# Patient Record
Sex: Male | Born: 1975
Health system: Southern US, Community
[De-identification: ages and names within clinical notes are randomized; demographics above are authoritative.]

## PROBLEM LIST (undated history)

## (undated) DIAGNOSIS — I10 Essential (primary) hypertension: Secondary | ICD-10-CM

## (undated) DIAGNOSIS — I639 Cerebral infarction, unspecified: Secondary | ICD-10-CM

---

## 1997-11-05 ENCOUNTER — Encounter: Payer: Self-pay | Admitting: Emergency Medicine

## 1997-11-05 ENCOUNTER — Emergency Department (HOSPITAL_COMMUNITY): Admission: EM | Admit: 1997-11-05 | Discharge: 1997-11-05 | Payer: Self-pay | Admitting: Emergency Medicine

## 1997-11-12 ENCOUNTER — Emergency Department (HOSPITAL_COMMUNITY): Admission: EM | Admit: 1997-11-12 | Discharge: 1997-11-12 | Payer: Self-pay | Admitting: Emergency Medicine

## 2001-12-20 ENCOUNTER — Emergency Department (HOSPITAL_COMMUNITY): Admission: EM | Admit: 2001-12-20 | Discharge: 2001-12-20 | Payer: Self-pay | Admitting: Emergency Medicine

## 2011-06-22 ENCOUNTER — Emergency Department (HOSPITAL_COMMUNITY)
Admission: EM | Admit: 2011-06-22 | Discharge: 2011-06-22 | Disposition: A | Discharge status: Home / Self Care | Payer: 59 | Attending: Emergency Medicine | Admitting: Emergency Medicine

## 2011-06-22 ENCOUNTER — Encounter (HOSPITAL_COMMUNITY): Payer: Self-pay | Admitting: *Deleted

## 2011-06-22 DIAGNOSIS — L03116 Cellulitis of left lower limb: Secondary | ICD-10-CM

## 2011-06-22 DIAGNOSIS — L02419 Cutaneous abscess of limb, unspecified: Secondary | ICD-10-CM | POA: Insufficient documentation

## 2011-06-22 DIAGNOSIS — F17200 Nicotine dependence, unspecified, uncomplicated: Secondary | ICD-10-CM | POA: Insufficient documentation

## 2011-06-22 MED ORDER — CLINDAMYCIN HCL 300 MG PO CAPS: 300.0000 mg | ORAL_CAPSULE | Freq: Three times a day (TID) | ORAL | Status: AC

## 2011-06-22 NOTE — ED Provider Notes (Signed)
History     CSN: [id number]  Arrival date & time 06/22/11  1918   First MD Initiated Contact with Patient 06/22/11 2105      Chief Complaint  Patient presents with  . Rash    (Consider location/radiation/quality/duration/timing/severity/associated sxs/prior treatment) HPI The patient presents to the ER with a red painful are to his L lower leg for the past 2 days. The patient denies fever, weakness, nausea, vomiting, or numbness. The patient states that the area has not drained any material. The patient noted that it was itching and that is what brought him to the ER. The patient denies any treatment prior to arrival.  History reviewed. No pertinent past medical history.  History reviewed. No pertinent past surgical history.  No family history on file.  History  Substance Use Topics  . Smoking status: Current Everyday Smoker -- 0.2 packs/day  . Smokeless tobacco: Not on file  . Alcohol Use: Yes     social rare      Review of Systems All other systems negative except as documented in the HPI. All pertinent positives and negatives as reviewed in the HPI.  Allergies  Review of patient's allergies indicates no known allergies.  Home Medications   Current Outpatient Rx  Name Route Sig Dispense Refill  . BACITRACIN-NEOMYCIN-POLYMYXIN [id number] EX OINT Topical Apply 1 application topically every 12 (twelve) hours. Applied to rash.      BP 147/104  Pulse 67  Temp(Src) 98.4 F (36.9 C) (Oral)  Resp 18  Wt 220 lb (99.791 kg)  SpO2 100%  Physical Exam  Nursing note and vitals reviewed. Constitutional: He appears well-developed and well-nourished. No distress.  Cardiovascular: Normal rate, regular rhythm and normal heart sounds.   Pulmonary/Chest: Effort normal and breath sounds normal.  Musculoskeletal:       Legs:   ED Course  Procedures (including critical care time)  The patient has been seen by the attending Physician.  The patient will be treated for a  lower leg cellulitis and advised to return here in 2 days for a recheck. Return here sooner for any worsening. Told to use warm compresses on the area.   MDM          Carlyle Dolly, PA-C 06/29/11 [phone]

## 2011-06-22 NOTE — ED Notes (Signed)
Pt c/o rash to left lower leg since Friday; painful; started itching today

## 2011-06-22 NOTE — Discharge Instructions (Signed)
Return here for any worsening in your condition. Use warm compresses on the area.

## 2011-06-30 NOTE — ED Provider Notes (Signed)
Medical screening examination/treatment/procedure(s) were conducted as a shared visit with non-physician practitioner(s) and myself.  I personally evaluated the patient during the encounter  LLE warmth, pain. No induration. Neurovasc intact. Abx, needs recheck 2 days   Forbes Cellar, MD 06/30/11 [phone]

## 2017-07-05 ENCOUNTER — Emergency Department (HOSPITAL_COMMUNITY)
Admission: EM | Admit: 2017-07-05 | Discharge: 2017-07-05 | Disposition: A | Discharge status: Home / Self Care | Payer: Self-pay | Attending: Emergency Medicine | Admitting: Emergency Medicine

## 2017-07-05 ENCOUNTER — Other Ambulatory Visit: Payer: Self-pay

## 2017-07-05 ENCOUNTER — Encounter (HOSPITAL_COMMUNITY): Payer: Self-pay | Admitting: Emergency Medicine

## 2017-07-05 ENCOUNTER — Emergency Department (HOSPITAL_COMMUNITY): Payer: Self-pay

## 2017-07-05 DIAGNOSIS — Y999 Unspecified external cause status: Secondary | ICD-10-CM | POA: Insufficient documentation

## 2017-07-05 DIAGNOSIS — Y9389 Activity, other specified: Secondary | ICD-10-CM | POA: Insufficient documentation

## 2017-07-05 DIAGNOSIS — S93401A Sprain of unspecified ligament of right ankle, initial encounter: Secondary | ICD-10-CM | POA: Insufficient documentation

## 2017-07-05 DIAGNOSIS — M62838 Other muscle spasm: Secondary | ICD-10-CM | POA: Insufficient documentation

## 2017-07-05 DIAGNOSIS — Y92410 Unspecified street and highway as the place of occurrence of the external cause: Secondary | ICD-10-CM | POA: Insufficient documentation

## 2017-07-05 DIAGNOSIS — M25571 Pain in right ankle and joints of right foot: Secondary | ICD-10-CM

## 2017-07-05 DIAGNOSIS — M25561 Pain in right knee: Secondary | ICD-10-CM

## 2017-07-05 DIAGNOSIS — S8391XA Sprain of unspecified site of right knee, initial encounter: Secondary | ICD-10-CM | POA: Insufficient documentation

## 2017-07-05 DIAGNOSIS — F17210 Nicotine dependence, cigarettes, uncomplicated: Secondary | ICD-10-CM | POA: Insufficient documentation

## 2017-07-05 MED ORDER — CYCLOBENZAPRINE HCL 10 MG PO TABS: 10.0000 mg | ORAL_TABLET | Freq: Three times a day (TID) | ORAL | 0 refills | Status: DC | PRN

## 2017-07-05 MED ORDER — NAPROXEN 500 MG PO TABS: 500.0000 mg | ORAL_TABLET | Freq: Two times a day (BID) | ORAL | 0 refills | Status: DC | PRN
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
## 2018-08-14 ENCOUNTER — Emergency Department (HOSPITAL_COMMUNITY): Payer: Medicaid Other

## 2018-08-14 ENCOUNTER — Encounter (HOSPITAL_COMMUNITY): Payer: Self-pay

## 2018-08-14 ENCOUNTER — Inpatient Hospital Stay (HOSPITAL_COMMUNITY)
Admission: EM | Admit: 2018-08-14 | Discharge: 2018-08-17 | DRG: 065 | Disposition: A | Discharge status: Rehabilitation Facility | Payer: Medicaid Other | Attending: Internal Medicine | Admitting: Internal Medicine

## 2018-08-14 ENCOUNTER — Other Ambulatory Visit: Payer: Self-pay

## 2018-08-14 DIAGNOSIS — Z833 Family history of diabetes mellitus: Secondary | ICD-10-CM | POA: Diagnosis not present

## 2018-08-14 DIAGNOSIS — E785 Hyperlipidemia, unspecified: Secondary | ICD-10-CM

## 2018-08-14 DIAGNOSIS — I69354 Hemiplegia and hemiparesis following cerebral infarction affecting left non-dominant side: Secondary | ICD-10-CM

## 2018-08-14 DIAGNOSIS — I639 Cerebral infarction, unspecified: Secondary | ICD-10-CM | POA: Diagnosis not present

## 2018-08-14 DIAGNOSIS — I700 Atherosclerosis of aorta: Secondary | ICD-10-CM | POA: Diagnosis present

## 2018-08-14 DIAGNOSIS — E559 Vitamin D deficiency, unspecified: Secondary | ICD-10-CM | POA: Diagnosis present

## 2018-08-14 DIAGNOSIS — R7989 Other specified abnormal findings of blood chemistry: Secondary | ICD-10-CM | POA: Diagnosis not present

## 2018-08-14 DIAGNOSIS — H5462 Unqualified visual loss, left eye, normal vision right eye: Secondary | ICD-10-CM | POA: Diagnosis present

## 2018-08-14 DIAGNOSIS — Z823 Family history of stroke: Secondary | ICD-10-CM | POA: Diagnosis not present

## 2018-08-14 DIAGNOSIS — G8194 Hemiplegia, unspecified affecting left nondominant side: Secondary | ICD-10-CM | POA: Diagnosis not present

## 2018-08-14 DIAGNOSIS — N179 Acute kidney failure, unspecified: Secondary | ICD-10-CM | POA: Diagnosis present

## 2018-08-14 DIAGNOSIS — F17210 Nicotine dependence, cigarettes, uncomplicated: Secondary | ICD-10-CM | POA: Diagnosis present

## 2018-08-14 DIAGNOSIS — E669 Obesity, unspecified: Secondary | ICD-10-CM | POA: Diagnosis present

## 2018-08-14 DIAGNOSIS — E876 Hypokalemia: Secondary | ICD-10-CM | POA: Diagnosis not present

## 2018-08-14 DIAGNOSIS — Z20828 Contact with and (suspected) exposure to other viral communicable diseases: Secondary | ICD-10-CM | POA: Diagnosis present

## 2018-08-14 DIAGNOSIS — F1290 Cannabis use, unspecified, uncomplicated: Secondary | ICD-10-CM | POA: Diagnosis not present

## 2018-08-14 DIAGNOSIS — R7303 Prediabetes: Secondary | ICD-10-CM | POA: Diagnosis not present

## 2018-08-14 DIAGNOSIS — R29709 NIHSS score 9: Secondary | ICD-10-CM | POA: Diagnosis present

## 2018-08-14 DIAGNOSIS — Z7902 Long term (current) use of antithrombotics/antiplatelets: Secondary | ICD-10-CM

## 2018-08-14 DIAGNOSIS — R29810 Facial weakness: Secondary | ICD-10-CM | POA: Diagnosis present

## 2018-08-14 DIAGNOSIS — R4781 Slurred speech: Secondary | ICD-10-CM | POA: Diagnosis present

## 2018-08-14 DIAGNOSIS — K5901 Slow transit constipation: Secondary | ICD-10-CM | POA: Diagnosis not present

## 2018-08-14 DIAGNOSIS — I253 Aneurysm of heart: Secondary | ICD-10-CM | POA: Diagnosis present

## 2018-08-14 DIAGNOSIS — E1151 Type 2 diabetes mellitus with diabetic peripheral angiopathy without gangrene: Secondary | ICD-10-CM | POA: Diagnosis present

## 2018-08-14 DIAGNOSIS — G8114 Spastic hemiplegia affecting left nondominant side: Secondary | ICD-10-CM | POA: Diagnosis not present

## 2018-08-14 DIAGNOSIS — Z832 Family history of diseases of the blood and blood-forming organs and certain disorders involving the immune mechanism: Secondary | ICD-10-CM | POA: Diagnosis not present

## 2018-08-14 DIAGNOSIS — I10 Essential (primary) hypertension: Secondary | ICD-10-CM | POA: Diagnosis present

## 2018-08-14 DIAGNOSIS — I63431 Cerebral infarction due to embolism of right posterior cerebral artery: Secondary | ICD-10-CM | POA: Diagnosis present

## 2018-08-14 DIAGNOSIS — I69398 Other sequelae of cerebral infarction: Secondary | ICD-10-CM | POA: Diagnosis not present

## 2018-08-14 DIAGNOSIS — H53462 Homonymous bilateral field defects, left side: Secondary | ICD-10-CM | POA: Diagnosis not present

## 2018-08-14 DIAGNOSIS — F482 Pseudobulbar affect: Secondary | ICD-10-CM | POA: Diagnosis not present

## 2018-08-14 DIAGNOSIS — Z6829 Body mass index (BMI) 29.0-29.9, adult: Secondary | ICD-10-CM | POA: Diagnosis not present

## 2018-08-14 DIAGNOSIS — I6330 Cerebral infarction due to thrombosis of unspecified cerebral artery: Secondary | ICD-10-CM

## 2018-08-14 DIAGNOSIS — R531 Weakness: Secondary | ICD-10-CM | POA: Diagnosis present

## 2018-08-14 HISTORY — DX: Essential (primary) hypertension: I10

## 2018-08-14 HISTORY — DX: Cerebral infarction, unspecified: I63.9

## 2018-08-14 LAB — I-STAT CHEM 8, ED
BUN: 21 mg/dL — ABNORMAL HIGH (ref 6–20)
Calcium, Ion: 1.23 mmol/L (ref 1.15–1.40)
Chloride: 107 mmol/L (ref 98–111)
Creatinine, Ser: 1 mg/dL (ref 0.61–1.24)
Glucose, Bld: 149 mg/dL — ABNORMAL HIGH (ref 70–99)
HCT: 45 % (ref 39.0–52.0)
Hemoglobin: 15.3 g/dL (ref 13.0–17.0)
Potassium: 3.7 mmol/L (ref 3.5–5.1)
Sodium: 143 mmol/L (ref 135–145)
TCO2: 25 mmol/L (ref 22–32)

## 2018-08-14 LAB — PROTIME-INR
INR: 1 (ref 0.8–1.2)
Prothrombin Time: 13.5 seconds (ref 11.4–15.2)

## 2018-08-14 LAB — URINALYSIS, ROUTINE W REFLEX MICROSCOPIC
Bacteria, UA: NONE SEEN
Bilirubin Urine: NEGATIVE
Glucose, UA: NEGATIVE mg/dL
Ketones, ur: NEGATIVE mg/dL
Leukocytes,Ua: NEGATIVE
Nitrite: NEGATIVE
Protein, ur: NEGATIVE mg/dL
Specific Gravity, Urine: 1.02 (ref 1.005–1.030)
pH: 6 (ref 5.0–8.0)

## 2018-08-14 LAB — APTT: aPTT: 26 seconds (ref 24–36)

## 2018-08-14 LAB — CBG MONITORING, ED: Glucose-Capillary: 147 mg/dL — ABNORMAL HIGH (ref 70–99)

## 2018-08-14 LAB — ETHANOL: Alcohol, Ethyl (B): <10 mg/dL (ref <10)

## 2018-08-14 LAB — COMPREHENSIVE METABOLIC PANEL
ALT: 30 U/L (ref 0–44)
AST: 18 U/L (ref 15–41)
Albumin: 4 g/dL (ref 3.5–5.0)
Alkaline Phosphatase: 111 U/L (ref 38–126)
Anion gap: 9 (ref 5–15)
BUN: 19 mg/dL (ref 6–20)
CO2: 24 mmol/L (ref 22–32)
Calcium: 9.3 mg/dL (ref 8.9–10.3)
Chloride: 108 mmol/L (ref 98–111)
Creatinine, Ser: 1.07 mg/dL (ref 0.61–1.24)
GFR calc Af Amer: >60 mL/min (ref >60)
GFR calc non Af Amer: >60 mL/min (ref >60)
Glucose, Bld: 155 mg/dL — ABNORMAL HIGH (ref 70–99)
Potassium: 3.6 mmol/L (ref 3.5–5.1)
Sodium: 141 mmol/L (ref 135–145)
Total Bilirubin: 0.7 mg/dL (ref 0.3–1.2)
Total Protein: 7.2 g/dL (ref 6.5–8.1)

## 2018-08-14 LAB — DIFFERENTIAL
Abs Immature Granulocytes: 0.05 10*3/uL (ref 0.00–0.07)
Basophils Absolute: 0 10*3/uL (ref 0.0–0.1)
Basophils Relative: 0 %
Eosinophils Absolute: 0 10*3/uL (ref 0.0–0.5)
Eosinophils Relative: 0 %
Immature Granulocytes: 0 %
Lymphocytes Relative: 11 %
Lymphs Abs: 1.4 10*3/uL (ref 0.7–4.0)
Monocytes Absolute: 1 10*3/uL (ref 0.1–1.0)
Monocytes Relative: 8 %
Neutro Abs: 10.3 10*3/uL — ABNORMAL HIGH (ref 1.7–7.7)
Neutrophils Relative %: 81 %

## 2018-08-14 LAB — CBC
HCT: 43.2 % (ref 39.0–52.0)
Hemoglobin: 15.4 g/dL (ref 13.0–17.0)
MCH: 31.5 pg (ref 26.0–34.0)
MCHC: 35.6 g/dL (ref 30.0–36.0)
MCV: 88.3 fL (ref 80.0–100.0)
Platelets: 185 10*3/uL (ref 150–400)
RBC: 4.89 MIL/uL (ref 4.22–5.81)
RDW: 13.4 % (ref 11.5–15.5)
WBC: 12.7 10*3/uL — ABNORMAL HIGH (ref 4.0–10.5)
nRBC: 0 % (ref 0.0–0.2)

## 2018-08-14 LAB — RAPID URINE DRUG SCREEN, HOSP PERFORMED
Amphetamines: NOT DETECTED
Barbiturates: NOT DETECTED
Benzodiazepines: NOT DETECTED
Cocaine: NOT DETECTED
Opiates: NOT DETECTED
Tetrahydrocannabinol: POSITIVE — AB

## 2018-08-14 LAB — SARS CORONAVIRUS 2 (TAT 6-24 HRS): SARS Coronavirus 2: NEGATIVE

## 2018-08-14 MED ORDER — ACETAMINOPHEN 160 MG/5ML PO SOLN: 650.0000 mg | ORAL | Status: DC | PRN

## 2018-08-14 MED ORDER — ASPIRIN 300 MG RE SUPP
600.0000 mg | Freq: Once | RECTAL | Status: AC
  Administered 2018-08-14: 600 mg via RECTAL
  Filled 2018-08-14: qty 2

## 2018-08-14 MED ORDER — ASPIRIN 300 MG RE SUPP: 300.0000 mg | Freq: Every day | RECTAL | Status: DC

## 2018-08-14 MED ORDER — ACETAMINOPHEN 325 MG PO TABS
650.0000 mg | ORAL_TABLET | ORAL | Status: DC | PRN
  Administered 2018-08-14: 650 mg via ORAL
  Filled 2018-08-14: qty 2

## 2018-08-14 MED ORDER — ACETAMINOPHEN 650 MG RE SUPP: 650.0000 mg | RECTAL | Status: DC | PRN

## 2018-08-14 MED ORDER — STROKE: EARLY STAGES OF RECOVERY BOOK
Freq: Once | Status: AC
  Administered 2018-08-14: 22:00:00
  Filled 2018-08-14: qty 1

## 2018-08-14 MED ORDER — SODIUM CHLORIDE 0.9 % IV SOLN
INTRAVENOUS | Status: DC
  Administered 2018-08-14: 23:00:00 via INTRAVENOUS

## 2018-08-14 MED ORDER — CLOPIDOGREL BISULFATE 75 MG PO TABS
75.0000 mg | ORAL_TABLET | Freq: Every day | ORAL | Status: DC
  Administered 2018-08-15 – 2018-08-17 (×2): 75 mg via ORAL
  Filled 2018-08-14 (×3): qty 1

## 2018-08-14 MED ORDER — ENOXAPARIN SODIUM 40 MG/0.4ML ~~LOC~~ SOLN
40.0000 mg | SUBCUTANEOUS | Status: DC
  Administered 2018-08-14 – 2018-08-16 (×2): 40 mg via SUBCUTANEOUS
  Filled 2018-08-14 (×3): qty 0.4

## 2018-08-14 MED ORDER — SENNOSIDES-DOCUSATE SODIUM 8.6-50 MG PO TABS
1.0000 | ORAL_TABLET | Freq: Every day | ORAL | Status: DC
  Administered 2018-08-16: 1 via ORAL
  Filled 2018-08-14 (×3): qty 1

## 2018-08-14 MED ORDER — ASPIRIN 325 MG PO TABS
325.0000 mg | ORAL_TABLET | Freq: Every day | ORAL | Status: DC
  Administered 2018-08-15 – 2018-08-17 (×2): 325 mg via ORAL
  Filled 2018-08-14 (×4): qty 1

## 2018-08-14 MED ORDER — IOHEXOL 350 MG/ML SOLN
90.0000 mL | Freq: Once | INTRAVENOUS | Status: AC | PRN
  Administered 2018-08-14: 90 mL via INTRAVENOUS
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
## 2018-08-14 NOTE — ED Notes (Signed)
Meal given to pt. Pt able to eat and drink independently. Family remains at bedside

## 2018-08-14 NOTE — ED Notes (Signed)
Please notify pt's mother Pincus Badder with updates: [phone]

## 2018-08-14 NOTE — ED Notes (Signed)
ED Provider at bedside. 

## 2018-08-14 NOTE — ED Provider Notes (Signed)
Care assumed from Dr. Sandi Carne at 7879.  43 year old gentleman with a history of right PCA stroke 6 months ago with residual left-sided weakness and visual field deficits.  Here with chief complaint of onset of dizziness last night around 9 PM.  Continue this morning and he fell and hit his head.  Had new left-sided facial droop and left-sided hemineglect with right eyelid ptosis.  Code stroke on arrival.  CT studies showed encephalomalacia with right PCA distribution.  Neurology following and recommends MRI prior to admission.  Still has deficits.  UA unremarkable for UTI.  UDS positive for THC.  No significant electrolyte abnormalities.  Mild leukocytosis of 12.7.  No anemia.  Normal coags.  Plan at the time of handoff was completion of MRI which the patient is awaiting.  Will then reengage neurology for expected admission.  MRI showed acute infarct.  Neurology reengage and the patient was subsequently admitted to the hospitalist service.   Tillie Fantasia, MD 08/14/18 6226    Sherwood Gambler, MD 08/14/18 [phone]

## 2018-08-14 NOTE — ED Notes (Signed)
ED TO INPATIENT HANDOFF REPORT  ED Nurse Name and Phone #:  [phone]  S Name/Age/Gender Todd Hood 43 y.o. male Room/Bed: 036C/036C  Code Status   Code Status: Not on file  Home/SNF/Other Home Patient oriented to: self, place, time and situation Is this baseline? Yes   Triage Complete: Triage complete  Chief Complaint stroke like sx  Triage Note Pt arrives with [facility] from home c/o dizziness that started yesterday that has increased this morning. Pt attempted to get up this morning at 0900 and states he "felt like he was going to fall over." Pt has hx of stroke in February 2020. 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 Per EMS, LVO is 2. Upon inspection, smile is asymmetrical. LKW 2130 last night. Pt states he recently stopped taking plavix in preparation for oral surgery.  EMS vitals:  99% O2 on RA RR 18 119/70 HR 72 CBG 168 97.1 temp     Allergies No Known Allergies  Level of Care/Admitting Diagnosis ED Disposition    ED Disposition Condition Comment   Admit  [facility] [[id number]]  Level of Care: Telemetry Cardiac [103]  Covid Evaluation: Asymptomatic Screening Protocol (No Symptoms)  Diagnosis: Stroke (cerebrum) [facility](HCC) [[id number]]) [[id number]]  Admitting Physician: Rhetta MuraSAMTANI, JAI-GURMUKH [phone][[id number]]  Attending Physician: Rhetta MuraSAMTANI, JAI-GURMUKH [phone][[id number]]  Estimated length of stay: past midnight tomorrow  Certification:: I certify this patient will need inpatient services for at least 2 midnights  PT Class (Do Not Modify): Inpatient [101]  PT Acc Code (Do Not Modify): Private [1]       B Medical/Surgery History Past Medical History:  Diagnosis Date  . CVA (cerebral vascular accident) (HCC)   . Hypertension    History reviewed. No pertinent surgical history.   A IV Location/Drains/Wounds Patient Lines/Drains/Airways  Status   Active Line/Drains/Airways    Name:   Placement date:   Placement time:   Site:   Days:   Peripheral IV 08/14/18 Right Hand   08/14/18    1049    Hand   less than 1   Peripheral IV 08/14/18 Right Antecubital   08/14/18    1123    Antecubital   less than 1          Intake/Output Last 24 hours No intake or output data in the 24 hours ending 08/14/18 2055  Labs/Imaging Results for orders placed or performed during the hospital encounter of 08/14/18 (from the past 48 hour(s))  Ethanol     Status: None   Collection Time: 08/14/18 11:06 AM  Result Value Ref Range   Alcohol, Ethyl (B) <10 <10 mg/dL    Comment: (NOTE) Lowest detectable limit for serum alcohol is 10 mg/dL. For medical purposes only. Performed at [facility], [address] [place], [place] [address]   Protime-INR     Status: None   Collection Time: 08/14/18 11:06 AM  Result Value Ref Range   Prothrombin Time 13.5 11.4 - 15.2 seconds   INR 1.0 0.8 - 1.2    Comment: (NOTE) INR goal varies based on device and disease states. Performed at [facility], [address] [place], [place] [address]   APTT     Status: None   Collection Time: 08/14/18 11:06 AM  Result Value Ref Range   aPTT 26 24 - 36 seconds    Comment: Performed  at [facility], [address] [place], [place] [address]  CBC     Status: Abnormal   Collection Time: 08/14/18 11:06 AM  Result Value Ref Range   WBC 12.7 (H) 4.0 - 10.5 K/uL   RBC 4.89 4.22 - 5.81 MIL/uL   Hemoglobin 15.4 13.0 - 17.0 g/dL   HCT 19.1 47.8 - 29.5 %   MCV 88.3 80.0 - 100.0 fL   MCH 31.5 26.0 - 34.0 pg   MCHC 35.6 30.0 - 36.0 g/dL   RDW 62.1 30.8 - 65.7 %   Platelets 185 150 - 400 K/uL   nRBC 0.0 0.0 - 0.2 %    Comment: Performed at [facility], [address], [place], [place] [address]  Differential     Status: Abnormal   Collection Time: 08/14/18 11:06 AM  Result Value Ref Range   Neutrophils Relative % 81 %   Neutro Abs 10.3 (H)  1.7 - 7.7 K/uL   Lymphocytes Relative 11 %   Lymphs Abs 1.4 0.7 - 4.0 K/uL   Monocytes Relative 8 %   Monocytes Absolute 1.0 0.1 - 1.0 K/uL   Eosinophils Relative 0 %   Eosinophils Absolute 0.0 0.0 - 0.5 K/uL   Basophils Relative 0 %   Basophils Absolute 0.0 0.0 - 0.1 K/uL   Immature Granulocytes 0 %   Abs Immature Granulocytes 0.05 0.00 - 0.07 K/uL    Comment: Performed at [facility], [address] [place], [place] [address]  Comprehensive metabolic panel     Status: Abnormal   Collection Time: 08/14/18 11:06 AM  Result Value Ref Range   Sodium 141 135 - 145 mmol/L   Potassium 3.6 3.5 - 5.1 mmol/L   Chloride 108 98 - 111 mmol/L   CO2 24 22 - 32 mmol/L   Glucose, Bld 155 (H) 70 - 99 mg/dL   BUN 19 6 - 20 mg/dL   Creatinine, Ser 4.13 0.61 - 1.24 mg/dL   Calcium 9.3 8.9 - 24.4 mg/dL   Total Protein 7.2 6.5 - 8.1 g/dL   Albumin 4.0 3.5 - 5.0 g/dL   AST 18 15 - 41 U/L   ALT 30 0 - 44 U/L   Alkaline Phosphatase 111 38 - 126 U/L   Total Bilirubin 0.7 0.3 - 1.2 mg/dL   GFR calc non Af Amer >60 >60 mL/min   GFR calc Af Amer >60 >60 mL/min   Anion gap 9 5 - 15    Comment: Performed at [facility], [address] [place], [place] [address]  I-stat chem 8, ED     Status: Abnormal   Collection Time: 08/14/18 11:23 AM  Result Value Ref Range   Sodium 143 135 - 145 mmol/L   Potassium 3.7 3.5 - 5.1 mmol/L   Chloride 107 98 - 111 mmol/L   BUN 21 (H) 6 - 20 mg/dL   Creatinine, Ser 2.53 0.61 - 1.24 mg/dL   Glucose, Bld 664 (H) 70 - 99 mg/dL   Calcium, Ion 4.03 4.74 - 1.40 mmol/L   TCO2 25 22 - 32 mmol/L   Hemoglobin 15.3 13.0 - 17.0 g/dL   HCT 25.9 56.3 - 87.5 %  CBG monitoring, ED     Status: Abnormal   Collection Time: 08/14/18 11:31 AM  Result Value Ref Range   Glucose-Capillary 147 (H) 70 - 99 mg/dL   Comment 1 Notify RN    Comment 2 Document in Chart  Urine rapid drug screen (hosp performed)     Status: Abnormal   Collection Time: 08/14/18 11:33 AM  Result  Value Ref Range   Opiates NONE DETECTED NONE DETECTED   Cocaine NONE DETECTED NONE DETECTED   Benzodiazepines NONE DETECTED NONE DETECTED   Amphetamines NONE DETECTED NONE DETECTED   Tetrahydrocannabinol POSITIVE (A) NONE DETECTED   Barbiturates NONE DETECTED NONE DETECTED    Comment: (NOTE) DRUG SCREEN FOR MEDICAL PURPOSES ONLY.  IF CONFIRMATION IS NEEDED FOR ANY PURPOSE, NOTIFY LAB WITHIN 5 DAYS. LOWEST DETECTABLE LIMITS FOR URINE DRUG SCREEN Drug Class                     Cutoff (ng/mL) Amphetamine and metabolites    1000 Barbiturate and metabolites    200 Benzodiazepine                 767 Tricyclics and metabolites     300 Opiates and metabolites        300 Cocaine and metabolites        300 THC                            50 Performed at [facility], [place] [address], [place], [place] [address]   Urinalysis, Routine w reflex microscopic     Status: Abnormal   Collection Time: 08/14/18 11:33 AM  Result Value Ref Range   Color, Urine YELLOW YELLOW   APPearance CLEAR CLEAR   Specific Gravity, Urine 1.020 1.005 - 1.030   pH 6.0 5.0 - 8.0   Glucose, UA NEGATIVE NEGATIVE mg/dL   Hgb urine dipstick SMALL (A) NEGATIVE   Bilirubin Urine NEGATIVE NEGATIVE   Ketones, ur NEGATIVE NEGATIVE mg/dL   Protein, ur NEGATIVE NEGATIVE mg/dL   Nitrite NEGATIVE NEGATIVE   Leukocytes,Ua NEGATIVE NEGATIVE   RBC / HPF 11-20 0 - 5 RBC/hpf   WBC, UA 0-5 0 - 5 WBC/hpf   Bacteria, UA NONE SEEN NONE SEEN   Squamous Epithelial / LPF 0-5 0 - 5   Mucus PRESENT     Comment: Performed at [facility], [place] [address] [place], [place] [address]  SARS CORONAVIRUS 2 Nasal Swab Aptima Multi Swab     Status: None   Collection Time: 08/14/18  1:48 PM   Specimen: Aptima Multi Swab; Nasal Swab  Result Value Ref Range   SARS Coronavirus 2 NEGATIVE NEGATIVE    Comment: (NOTE) SARS-CoV-2 target nucleic acids are NOT DETECTED. The SARS-CoV-2 RNA is generally detectable in upper and  lower respiratory specimens during the acute phase of infection. Negative results do not preclude SARS-CoV-2 infection, do not rule out co-infections with other pathogens, and should not be used as the sole basis for treatment or other patient management decisions. Negative results must be combined with clinical observations, patient history, and epidemiological information. The expected result is Negative. Fact Sheet for Patients: SugarRoll.be Fact Sheet for Healthcare Providers: https://www.woods-mathews.com/ This test is not yet approved or cleared by the [place] FDA and  has been authorized for detection and/or diagnosis of SARS-CoV-2 by FDA under an Emergency Use Authorization (EUA). This EUA will remain  in effect (meaning this test can be used) for the duration of the COVID-19 declaration under Section 56 4(b)(1) of the Act, 21 U.S.C. section 360bbb-3(b)(1), unless the authorization is terminated or revoked sooner. Performed at [facility], [place] [address], [place], [place] [address]  Ct Code Stroke Cta Head W/wo Contrast  Result Date: 08/14/2018 CLINICAL DATA:  43 year old male code stroke. Left arm weakness, altered mental status. Clinically, a tip of the basilar occlusion is being question. EXAM: CT ANGIOGRAPHY HEAD AND NECK CT PERFUSION BRAIN TECHNIQUE: Multidetector CT imaging of the head and neck was performed using the standard protocol during bolus administration of intravenous contrast. Multiplanar CT image reconstructions and MIPs were obtained to evaluate the vascular anatomy. Carotid stenosis measurements (when applicable) are obtained utilizing NASCET criteria, using the distal internal carotid diameter as the denominator. Multiphase CT imaging of the brain was performed following IV bolus contrast injection. Subsequent parametric perfusion maps were calculated using RAPID software. CONTRAST:  90mL OMNIPAQUE IOHEXOL  350 MG/ML SOLN COMPARISON:  Plain head CT 1144 hours today. FINDINGS: CT Brain Perfusion Findings: ASPECTS: 10 CBF (<30%) Volume: None Perfusion (Tmax>6.0s) volume: None Mismatch Volume: Not applicable Infarction Location:Not applicable CTA NECK Skeleton: No acute osseous abnormality identified. Scoliosis. Reversal of cervical lordosis. Upper chest: Negative lung apices and superior mediastinum. Other neck: Negative. Aortic arch: 3 vessel arch configuration. No arch atherosclerosis or great vessel origin stenosis. Right carotid system: Negative. Left carotid system: The left CCA and left ICA are somewhat smaller than the right side, but otherwise negative. The left carotid bifurcation is negative. Vertebral arteries: Normal proximal right subclavian artery and right vertebral artery origin. Normal right vertebral artery to the skull base. Normal proximal left subclavian artery and left vertebral artery origin. The left vertebral artery appears mildly non dominant but otherwise normal to the skull base. CTA HEAD Posterior circulation: Dominant distal right vertebral artery. Patent PICA origins. No distal vertebral stenosis. Mildly dolichoectatic distal vertebral arteries and vertebrobasilar junction. Dolichoectatic basilar artery (5 millimeters diameter). The basilar tip is mildly tortuous. The right PCA is occluded at its origin (series 12, image 22, with chronic encephalomalacia noted throughout the right PCA territory. Both SCA and the left PCA origins are patent. There is no right PCA enhancement. The left PCA is mildly irregular at its origin and in the P2 segment, but there is distal left PCA enhancement. Anterior circulation: Both ICA siphons are patent. The left appears non dominant, and the left ACA A1 segment is absent. The right A1 is dominant. The left ICA siphon is mildly irregular without significant stenosis. The right ICA siphon is also mildly irregular, but without discrete plaque. No right ICA  siphon stenosis. Normal right ICA terminus, right MCA and dominant right A1 origin. The anterior communicating artery and bilateral ACA branches are within normal limits. Left MCA M1 segment and bifurcation are patent without stenosis. Left MCA branches are within normal limits. Right MCA M1 and bifurcation are patent without stenosis. Right MCA branches are within normal limits. Venous sinuses: Early contrast timing, not evaluated. Anatomic variants: Mildly dominant right vertebral artery. Dominant right and absent left ACA A1 segments with associated dominance of the right carotid. Review of the MIP images confirms the above findings IMPRESSION: 1. Negative for emergent large vessel occlusion, and no ischemia or core infarct detected by CT Perfusion. 2. Positive for chronic Right PCA occlusion at its origin, corresponding to chronic right PCA territory encephalomalacia. 3. Ectatic basilar artery. Mild irregularity of both ICA siphons and the Left PCA without significant stenosis. 4. No carotid or vertebral artery abnormality in the neck. These results were communicated to Dr. Otelia LimesLindzen at 12:20 pmon 08/14/2018 by text page via the [facility] messaging system. Electronically Signed   By: Althea GrimmerH  Hall M.D.  On: 08/14/2018 12:20   Ct Code Stroke Cta Neck W/wo Contrast  Result Date: 08/14/2018 CLINICAL DATA:  43 year old male code stroke. Left arm weakness, altered mental status. Clinically, a tip of the basilar occlusion is being question. EXAM: CT ANGIOGRAPHY HEAD AND NECK CT PERFUSION BRAIN TECHNIQUE: Multidetector CT imaging of the head and neck was performed using the standard protocol during bolus administration of intravenous contrast. Multiplanar CT image reconstructions and MIPs were obtained to evaluate the vascular anatomy. Carotid stenosis measurements (when applicable) are obtained utilizing NASCET criteria, using the distal internal carotid diameter as the denominator. Multiphase CT imaging of the brain was  performed following IV bolus contrast injection. Subsequent parametric perfusion maps were calculated using RAPID software. CONTRAST:  90mL OMNIPAQUE IOHEXOL 350 MG/ML SOLN COMPARISON:  Plain head CT 1144 hours today. FINDINGS: CT Brain Perfusion Findings: ASPECTS: 10 CBF (<30%) Volume: None Perfusion (Tmax>6.0s) volume: None Mismatch Volume: Not applicable Infarction Location:Not applicable CTA NECK Skeleton: No acute osseous abnormality identified. Scoliosis. Reversal of cervical lordosis. Upper chest: Negative lung apices and superior mediastinum. Other neck: Negative. Aortic arch: 3 vessel arch configuration. No arch atherosclerosis or great vessel origin stenosis. Right carotid system: Negative. Left carotid system: The left CCA and left ICA are somewhat smaller than the right side, but otherwise negative. The left carotid bifurcation is negative. Vertebral arteries: Normal proximal right subclavian artery and right vertebral artery origin. Normal right vertebral artery to the skull base. Normal proximal left subclavian artery and left vertebral artery origin. The left vertebral artery appears mildly non dominant but otherwise normal to the skull base. CTA HEAD Posterior circulation: Dominant distal right vertebral artery. Patent PICA origins. No distal vertebral stenosis. Mildly dolichoectatic distal vertebral arteries and vertebrobasilar junction. Dolichoectatic basilar artery (5 millimeters diameter). The basilar tip is mildly tortuous. The right PCA is occluded at its origin (series 12, image 22, with chronic encephalomalacia noted throughout the right PCA territory. Both SCA and the left PCA origins are patent. There is no right PCA enhancement. The left PCA is mildly irregular at its origin and in the P2 segment, but there is distal left PCA enhancement. Anterior circulation: Both ICA siphons are patent. The left appears non dominant, and the left ACA A1 segment is absent. The right A1 is dominant. The  left ICA siphon is mildly irregular without significant stenosis. The right ICA siphon is also mildly irregular, but without discrete plaque. No right ICA siphon stenosis. Normal right ICA terminus, right MCA and dominant right A1 origin. The anterior communicating artery and bilateral ACA branches are within normal limits. Left MCA M1 segment and bifurcation are patent without stenosis. Left MCA branches are within normal limits. Right MCA M1 and bifurcation are patent without stenosis. Right MCA branches are within normal limits. Venous sinuses: Early contrast timing, not evaluated. Anatomic variants: Mildly dominant right vertebral artery. Dominant right and absent left ACA A1 segments with associated dominance of the right carotid. Review of the MIP images confirms the above findings IMPRESSION: 1. Negative for emergent large vessel occlusion, and no ischemia or core infarct detected by CT Perfusion. 2. Positive for chronic Right PCA occlusion at its origin, corresponding to chronic right PCA territory encephalomalacia. 3. Ectatic basilar artery. Mild irregularity of both ICA siphons and the Left PCA without significant stenosis. 4. No carotid or vertebral artery abnormality in the neck. These results were communicated to Dr. Otelia LimesLindzen at 12:20 pmon 08/14/2018 by text page via the [facility] messaging system. Electronically Signed   By:  Odessa Fleming M.D.   On: 08/14/2018 12:20   Mr Brain Wo Contrast  Result Date: 08/14/2018 CLINICAL DATA:  Dizziness which began yesterday has increased this morning. Imbalance. Recent stroke February 2020. Difficulty opening RIGHT eye. Recently stopped taking Plavix in preparation for oral surgery. EXAM: MRI HEAD WITHOUT CONTRAST TECHNIQUE: Multiplanar, multiecho pulse sequences of the brain and surrounding structures were obtained without intravenous contrast. COMPARISON:  Code stroke CT and CTA head neck earlier today. FINDINGS: Brain: Pleomorphic contiguous area(s) of restricted  diffusion, corresponding low ADC, affect the RIGHT cerebral peduncle, RIGHT paramedian midbrain extending to the aqueduct, medial RIGHT thalamus, as well as posterior limb and genu internal capsule, also on the RIGHT consistent with acute nonhemorrhagic infarction. Large area of encephalomalacia, representing a chronic PCA infarct affects the RIGHT occipital lobe and RIGHT posterior temporal lobe. No other areas of chronic infarction. Normal for age cerebral volume. T2 and FLAIR hyperintensities in the white matter, likely small vessel disease. Hemosiderin deposition in the chronic RIGHT occipital infarct, with additional punctate areas of susceptibility LEFT frontal white matter. Vascular: Flow voids are maintained. Marked dolichoectasia, likely hypertensive in origin. Skull and upper cervical spine: Normal marrow signal. Sinuses/Orbits: Negative. Other: None. IMPRESSION: Acute nonhemorrhagic RIGHT thalamic, midbrain, and internal capsule infarcts. Chronic RIGHT occipital and posterior temporal PCA territory infarcts. Mild small vessel disease. Electronically Signed   By: Elsie Stain M.D.   On: 08/14/2018 15:59   Ct Code Stroke Cta Cerebral Perfusion W/wo Contrast  Result Date: 08/14/2018 CLINICAL DATA:  43 year old male code stroke. Left arm weakness, altered mental status. Clinically, a tip of the basilar occlusion is being question. EXAM: CT ANGIOGRAPHY HEAD AND NECK CT PERFUSION BRAIN TECHNIQUE: Multidetector CT imaging of the head and neck was performed using the standard protocol during bolus administration of intravenous contrast. Multiplanar CT image reconstructions and MIPs were obtained to evaluate the vascular anatomy. Carotid stenosis measurements (when applicable) are obtained utilizing NASCET criteria, using the distal internal carotid diameter as the denominator. Multiphase CT imaging of the brain was performed following IV bolus contrast injection. Subsequent parametric perfusion maps were  calculated using RAPID software. CONTRAST:  90mL OMNIPAQUE IOHEXOL 350 MG/ML SOLN COMPARISON:  Plain head CT 1144 hours today. FINDINGS: CT Brain Perfusion Findings: ASPECTS: 10 CBF (<30%) Volume: None Perfusion (Tmax>6.0s) volume: None Mismatch Volume: Not applicable Infarction Location:Not applicable CTA NECK Skeleton: No acute osseous abnormality identified. Scoliosis. Reversal of cervical lordosis. Upper chest: Negative lung apices and superior mediastinum. Other neck: Negative. Aortic arch: 3 vessel arch configuration. No arch atherosclerosis or great vessel origin stenosis. Right carotid system: Negative. Left carotid system: The left CCA and left ICA are somewhat smaller than the right side, but otherwise negative. The left carotid bifurcation is negative. Vertebral arteries: Normal proximal right subclavian artery and right vertebral artery origin. Normal right vertebral artery to the skull base. Normal proximal left subclavian artery and left vertebral artery origin. The left vertebral artery appears mildly non dominant but otherwise normal to the skull base. CTA HEAD Posterior circulation: Dominant distal right vertebral artery. Patent PICA origins. No distal vertebral stenosis. Mildly dolichoectatic distal vertebral arteries and vertebrobasilar junction. Dolichoectatic basilar artery (5 millimeters diameter). The basilar tip is mildly tortuous. The right PCA is occluded at its origin (series 12, image 22, with chronic encephalomalacia noted throughout the right PCA territory. Both SCA and the left PCA origins are patent. There is no right PCA enhancement. The left PCA is mildly irregular at its origin and in  the P2 segment, but there is distal left PCA enhancement. Anterior circulation: Both ICA siphons are patent. The left appears non dominant, and the left ACA A1 segment is absent. The right A1 is dominant. The left ICA siphon is mildly irregular without significant stenosis. The right ICA siphon is  also mildly irregular, but without discrete plaque. No right ICA siphon stenosis. Normal right ICA terminus, right MCA and dominant right A1 origin. The anterior communicating artery and bilateral ACA branches are within normal limits. Left MCA M1 segment and bifurcation are patent without stenosis. Left MCA branches are within normal limits. Right MCA M1 and bifurcation are patent without stenosis. Right MCA branches are within normal limits. Venous sinuses: Early contrast timing, not evaluated. Anatomic variants: Mildly dominant right vertebral artery. Dominant right and absent left ACA A1 segments with associated dominance of the right carotid. Review of the MIP images confirms the above findings IMPRESSION: 1. Negative for emergent large vessel occlusion, and no ischemia or core infarct detected by CT Perfusion. 2. Positive for chronic Right PCA occlusion at its origin, corresponding to chronic right PCA territory encephalomalacia. 3. Ectatic basilar artery. Mild irregularity of both ICA siphons and the Left PCA without significant stenosis. 4. No carotid or vertebral artery abnormality in the neck. These results were communicated to Dr. Otelia Limes at 12:20 pmon 08/14/2018 by text page via the [facility] messaging system. Electronically Signed   By: Odessa Fleming M.D.   On: 08/14/2018 12:20   Ct Head Code Stroke Wo Contrast  Result Date: 08/14/2018 CLINICAL DATA:  Code stroke. 43 year old male with left arm weakness. EXAM: CT HEAD WITHOUT CONTRAST TECHNIQUE: Contiguous axial images were obtained from the base of the skull through the vertex without intravenous contrast. COMPARISON:  None available. FINDINGS: Brain: Chronic encephalomalacia in the right PCA territory, including the right thalamus. No cortically based acute infarct identified. Outside of the right PCA territory gray-white matter differentiation appears symmetric and within normal limits. No acute intracranial hemorrhage identified. No midline shift, mass  effect, or evidence of intracranial mass lesion. No ventriculomegaly. Vascular: Questionable asymmetric hyperdensity at the right ICA terminus and MCA origin on series 3, image 10. Skull: Negative. Congenital incomplete ossification of the posterior C1 ring. Sinuses/Orbits: Visualized paranasal sinuses and mastoids are stable and well pneumatized. Other: There is some rightward gaze deviation. No acute scalp soft tissue findings. ASPECTS [facility] Stroke Program Early CT Score) Total score (0-10 with 10 being normal): 10 IMPRESSION: 1. Questionable asymmetric hyperdensity at the right ICA terminus and MCA origin, but no acute cortically based infarct or acute intracranial hemorrhage identified. ASPECTS 10. 2. Chronic right PCA territory infarct. 3. These results were communicated to Dr. Otelia Limes at 11:55 amon 8/2/2020by text page via the [facility] messaging system. Electronically Signed   By: Odessa Fleming M.D.   On: 08/14/2018 11:56    Pending Labs [employer] (From admission, onward)    Start     Ordered   Signed and Held  Hemoglobin A1c  Tomorrow morning,   R     Signed and Held   Signed and Held  Lipid panel  Tomorrow morning,   R    Comments: Fasting    Signed and Held   Signed and Held  CBC  (enoxaparin (LOVENOX)    CrCl >/= 30 ml/min)  Once,   R    Comments: Baseline for enoxaparin therapy IF NOT ALREADY DRAWN.  Notify MD if PLT < 100 K.    Signed and Held  Signed and Held  Creatinine, serum  (enoxaparin (LOVENOX)    CrCl >/= 30 ml/min)  Once,   R    Comments: Baseline for enoxaparin therapy IF NOT ALREADY DRAWN.    Signed and Held   Signed and Held  Creatinine, serum  (enoxaparin (LOVENOX)    CrCl >/= 30 ml/min)  Weekly,   R    Comments: while on enoxaparin therapy    Signed and Held          Vitals/Pain Today's Vitals   08/14/18 1900 08/14/18 1915 08/14/18 1949 08/14/18 1952  BP: (!) 139/100 (!) 132/101 (!) 142/98   Pulse: 74 74 74   Resp:  18 20   Temp:      TempSrc:       SpO2: 99% 99% 99%   Weight:      Height:      PainSc:    0-No pain    Isolation Precautions No active isolations  Medications Medications  clopidogrel (PLAVIX) tablet 75 mg (75 mg Oral Not Given 08/14/18 1401)  acetaminophen (TYLENOL) tablet 650 mg (650 mg Oral Given 08/14/18 1824)    Or  acetaminophen (TYLENOL) solution 650 mg ( Per Tube See Alternative 08/14/18 1824)    Or  acetaminophen (TYLENOL) suppository 650 mg ( Rectal See Alternative 08/14/18 1824)  iohexol (OMNIPAQUE) 350 MG/ML injection 90 mL (90 mLs Intravenous Contrast Given 08/14/18 1156)  aspirin suppository 600 mg (600 mg Rectal Given 08/14/18 1347)    Mobility walks with person assist Moderate fall risk   Focused Assessments Neuro Assessment Handoff:  Swallow screen pass? Yes  Cardiac Rhythm: Normal sinus rhythm NIH Stroke Scale ( + Modified Stroke Scale Criteria)  Interval: Initial Level of Consciousness (1a.)   : Not alert, but arousable by minor stimulation to obey, answer, or respond LOC Questions (1b. )   +: Answers both questions correctly LOC Commands (1c. )   + : Performs both tasks correctly Best Gaze (2. )  +: Partial gaze palsy(Chronic) Visual (3. )  +: Partial hemianopia(CHornic) Facial Palsy (4. )    : Minor paralysis Motor Arm, Left (5a. )   +: Drift Motor Arm, Right (5b. )   +: No drift Motor Leg, Left (6a. )   +: Some effort against gravity Motor Leg, Right (6b. )   +: No drift Limb Ataxia (7. ): Present in one limb Sensory (8. )   +: Normal, no sensory loss Best Language (9. )   +: No aphasia Dysarthria (10. ): Mild-to-moderate dysarthria, patient slurs at least some words and, at worst, can be understood with some difficulty Extinction/Inattention (11.)   +: No Abnormality Modified SS Total  +: 5 Complete NIHSS TOTAL: 9 Last date known well: 08/13/18 Last time known well: 2100 Neuro Assessment: Exceptions to WDL Neuro Checks:   Initial (08/14/18 1145)  Last Documented NIHSS Modified  Score: 5 (08/14/18 1955) Has TPA been given? No If patient is a Neuro Trauma and patient is going to OR before floor call report to 4N Charge nurse: [phone] or [phone]     R Recommendations: See Admitting Provider Note  Report given to:   Additional Notes:

## 2018-08-14 NOTE — Consult Note (Signed)
Neurology consult note  Referring Physician: Dr. Billy Fischer    Chief Complaint: Code stroke  HPI: Todd Hood is an 43 y.o. male presenting with acute onset of left sided weakness, gait unsteadiness, worsening somnolence and ocular motility deficit. LKN was 2130 last night. Symptoms were noted on awakening today around 0900.   PMH include HTN, HLD, Vit D deficiency, prediabetic, hx R PCA stroke Feb 2020 with residual deficits of visual impairment and L sided weakness. Of note, patient has decreased vision in L eye from previous stroke. He was prescribed plavix at home but has not been taking it for last month, reason unclear. Pt was in usual state of health prior to symptom onset, no recent illnesses. Endorses HA since this morning.  Upon assessment in the EDD, patient became progressively more somnolent although was able to arouse easily with loud voice or minimal noxious stimuli. He was was only able to give a fragmentary history and answer some questions in detail in the context of waxing/waning drowsy to somnolent state.   LSN: [id number] 08/13/2018 tPA Given: No: Out of time window  Past Medical History:  Diagnosis Date  . CVA (cerebral vascular accident) [facility])    Social History:  reports that he has been smoking. He has been smoking about 0.25 packs per day. He has never used smokeless tobacco. He reports current alcohol use. He reports current drug use. Drug: Marijuana.  Allergies: No Known Allergies  Medications:  I have reviewed the patient's current medications. Scheduled: . clopidogrel  75 mg Oral Daily   ROS: General ROS: negative for - chills, fatigue, fever, night sweats, weight gain or weight loss Ophthalmic ROS: (+) blurry/loss of vision of L eye ENT ROS: negative for - epistaxis, nasal discharge, oral lesions, sore throat, tinnitus or vertigo Respiratory ROS: negative for - cough,  shortness of breath or wheezing Cardiovascular ROS: negative for - chest pain, dyspnea on  exertion,  Gastrointestinal ROS: negative for - abdominal pain, diarrhea,  nausea/vomiting or stool incontinence Genito-Urinary ROS: negative for - dysuria, hematuria, incontinence or urinary frequency/urgency Musculoskeletal ROS: negative for - joint swelling or muscular weakness Neurological ROS: as noted in HPI  Physical Examination: Blood pressure 125/88, pulse 78, temperature 98.5 F (36.9 C), temperature source Oral, resp. rate (!) 22, height 5\' 9"  (1.753 m), weight 87.1 kg, SpO2 100 %.  Neurologic Examination: GEN: NAD, pleasant, cooperative CVS: RRR, no carotid bruit CHEST: No signs of resp distress, on room air ABD: Soft, NTTP  NEURO:  MENTAL STATUS: AAOx3  LANG/SPEECH: Mild dysphasia, intact naming, repetition & comprehension  CRANIAL NERVES:  II: Pupils unequal, L 30mm sluggish, R 2.5 mm sluggish III, IV, VI:   R eye able to abduct but unable to supraduct, infraduct or medially rotate past the midline. Severe R ptosis.  He can move his left eye to left and right slowly and with difficulty initially, followed by what appears to be forced infraduction of the left eye towards the end of the exam that is associated with with irregular, non-rhythmic vertical nystagmus.   V: Subjectively with equal temp sensation bilaterally VII: L facial droop, tongue deviates to the left VIII: normal hearing to speech  MOTOR: RHB 5/5, L grip and biceps 4/5, L deltoid 0/5, LLE 4+/5. Has delayed movement in L side. Can't initiate movement on L side unless there is sensory stimulation REFLEXES: BUE and bilateral achilles 2+, R patella 3+, L patella 4+, R toes downgoing, L toes mute SENSORY: Decreased temperature in R face and  RUE. Normal to touch & pin prick in all extremiteis  COORD: Non-ataxic FNF on the right.    Results for orders placed or performed during the hospital encounter of 08/14/18 (from the past 48 hour(s))  Ethanol     Status: None   Collection Time: 08/14/18 11:06 AM  Result  Value Ref Range   Alcohol, Ethyl (B) <10 <10 mg/dL    Comment: (NOTE) Lowest detectable limit for serum alcohol is 10 mg/dL. For medical purposes only. Performed at [facility], [address] [place], [place] [address]   Protime-INR     Status: None   Collection Time: 08/14/18 11:06 AM  Result Value Ref Range   Prothrombin Time 13.5 11.4 - 15.2 seconds   INR 1.0 0.8 - 1.2    Comment: (NOTE) INR goal varies based on device and disease states. Performed at [facility], [address] [place], [place] [address]   APTT     Status: None   Collection Time: 08/14/18 11:06 AM  Result Value Ref Range   aPTT 26 24 - 36 seconds    Comment: Performed at [facility], [address] [place], [place] [address]  CBC     Status: Abnormal   Collection Time: 08/14/18 11:06 AM  Result Value Ref Range   WBC 12.7 (H) 4.0 - 10.5 K/uL   RBC 4.89 4.22 - 5.81 MIL/uL   Hemoglobin 15.4 13.0 - 17.0 g/dL   HCT 44.043.2 34.739.0 - 42.552.0 %   MCV 88.3 80.0 - 100.0 fL   MCH 31.5 26.0 - 34.0 pg   MCHC 35.6 30.0 - 36.0 g/dL   RDW 95.613.4 38.711.5 - 56.415.5 %   Platelets 185 150 - 400 K/uL   nRBC 0.0 0.0 - 0.2 %    Comment: Performed at [facility], [address] [place], [place] [address]  Differential     Status: Abnormal   Collection Time: 08/14/18 11:06 AM  Result Value Ref Range   Neutrophils Relative % 81 %   Neutro Abs 10.3 (H) 1.7 - 7.7 K/uL   Lymphocytes Relative 11 %   Lymphs Abs 1.4 0.7 - 4.0 K/uL   Monocytes Relative 8 %   Monocytes Absolute 1.0 0.1 - 1.0 K/uL   Eosinophils Relative 0 %   Eosinophils Absolute 0.0 0.0 - 0.5 K/uL   Basophils Relative 0 %   Basophils Absolute 0.0 0.0 - 0.1 K/uL   Immature Granulocytes 0 %   Abs Immature Granulocytes 0.05 0.00 - 0.07 K/uL    Comment: Performed at [facility], [address] [place], [place] [address]  Comprehensive metabolic panel     Status: Abnormal   Collection Time: 08/14/18 11:06 AM  Result Value Ref Range    Sodium 141 135 - 145 mmol/L   Potassium 3.6 3.5 - 5.1 mmol/L   Chloride 108 98 - 111 mmol/L   CO2 24 22 - 32 mmol/L   Glucose, Bld 155 (H) 70 - 99 mg/dL   BUN 19 6 - 20 mg/dL   Creatinine, Ser 6.601.07 0.61 - 1.24 mg/dL   Calcium 9.3 8.9 - 63.010.3 mg/dL   Total Protein 7.2 6.5 - 8.1 g/dL   Albumin 4.0 3.5 - 5.0 g/dL   AST 18 15 - 41 U/L   ALT 30 0 - 44 U/L   Alkaline Phosphatase 111 38 - 126 U/L   Total Bilirubin 0.7 0.3 - 1.2 mg/dL   GFR calc  non Af Amer >60 >60 mL/min   GFR calc Af Amer >60 >60 mL/min   Anion gap 9 5 - 15    Comment: Performed at [facility], [address] [place], [place] [address]  I-stat chem 8, ED     Status: Abnormal   Collection Time: 08/14/18 11:23 AM  Result Value Ref Range   Sodium 143 135 - 145 mmol/L   Potassium 3.7 3.5 - 5.1 mmol/L   Chloride 107 98 - 111 mmol/L   BUN 21 (H) 6 - 20 mg/dL   Creatinine, Ser 8.111.00 0.61 - 1.24 mg/dL   Glucose, Bld 914149 (H) 70 - 99 mg/dL   Calcium, Ion 7.821.23 9.561.15 - 1.40 mmol/L   TCO2 25 22 - 32 mmol/L   Hemoglobin 15.3 13.0 - 17.0 g/dL   HCT 21.345.0 08.639.0 - 57.852.0 %  CBG monitoring, ED     Status: Abnormal   Collection Time: 08/14/18 11:31 AM  Result Value Ref Range   Glucose-Capillary 147 (H) 70 - 99 mg/dL   Comment 1 Notify RN    Comment 2 Document in Chart     Assessment: 43 y.o. male with history of HTN, HLD, Vit D deficiency, prediabetic, hx R PCA stroke Feb 2020 with residual deficits of visual impairment, L sided weakness, L vision loss presents with acute onset of L sided weakness, gait unsteadiness, worsening somnolence, and ocular muscle weakness. - LKN 8/1 2130, outside of tPA window - Concern for basilar artery pathology vs brainstem ischemia/infarct given patient presentation- vision/gait deficits along with worsening confusion - CT code stroke negative for LVO, no acute ischemia or infarct seen, chronic T PCA occlusion + R PCA territory encephalomalacia, ectatic basilar artery. ASPECTS 10. -- CTA head and  neck:: Negative for emergent large vessel occlusion, and no ischemia or core infarct detected by CT Perfusion. Positive for chronic Right PCA occlusion at its origin, corresponding to chronic right PCA territory encephalomalacia. Ectatic basilar artery. Mild irregularity of both ICA siphons and the Left PCA without significant stenosis. No carotid or vertebral artery abnormality in the neck. -- Stroke Risk Factors - hyperlipidemia, hypertension and smoking -- Out of the time window for tPA. Not a VIR candidate due to no LVO  Recommendations: 1. Stat MRI brain 2. Give ASA 650 mg po or 600 mg rectal now.  3. Continue home plavix 75 mg QD  4. Permissive HTN for 24 hours 5. PT consult, OT consult, Speech consult 6. Echocardiogram 7. HgbA1c, fasting lipid panel 8. Risk factor modification 9. Telemetry monitoring 10. Frequent neuro checks   Addendum: MRI completed, with findings as follows: -- Acute nonhemorrhagic RIGHT thalamic, midbrain, and internal capsule Infarcts.  -- Chronic RIGHT occipital and posterior temporal PCA territory infarcts. -- Mild small vessel disease.  I have seen and examined the patient with the Neurohospitalist PA, who observed and documented my exam findings. I have formulated the assessment and recommendations.  Electronically signed: Dr. Caryl PinaEric Cristle Jared

## 2018-08-14 NOTE — ED Triage Notes (Addendum)
Pt arrives with [facility] from home c/o dizziness that started yesterday that has increased this morning. Pt attempted to get up this morning at 0900 and states he "felt like he was going to fall over." Pt has hx of stroke in February 2020. 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 Per EMS, LVO is 2. Upon inspection, smile is asymmetrical. LKW 2130 last night. Pt states he recently stopped taking plavix in preparation for oral surgery.  EMS vitals:  99% O2 on RA RR 18 119/70 HR 72 CBG 168 97.1 temp

## 2018-08-14 NOTE — ED Notes (Signed)
CODE STROKE called by Dr. Billy Fischer at 1106.

## 2018-08-14 NOTE — ED Provider Notes (Signed)
[facility] EMERGENCY DEPARTMENT Provider Note   CSN: [id number] Arrival date & time: 08/14/18  1043    History   Chief Complaint Chief Complaint  Patient presents with   Code Stroke    HPI Asa Baudoin is a 43 y.o. male. PMH HTN, HLD, Vit D deficiency, hx stroke 02/2018 with visual impairment and left sided weakness, and obesity.  Presents via EMS with concern for stroke.  Patient reports that last night at 9:30pm he started to have some dizziness.  This AM upon waking around 0900, he started to have loss of balance, slurred speech, and difficulty opening right eye.  With loss of balance, patient noted that he fell and hit his head.  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  Patient notes that he has been without Plavix for the last month.  It is unclear reason, as he told nurse it was for preparation for oral surgery, but notes that he was told recently by his PCP that he needed to restart Plavix, but had not done so yet.  Patient also endorses a headache this a.m.  Prior to yesterday evening, patient notes that he was in his normal state of health and was without fevers, headaches, weakness, chest pain, shortness of breath. En route via EMS vitals stable with CBG 168.      Past Medical History:  Diagnosis Date   CVA (cerebral vascular accident) ([place])    Hypertension     There are no active problems to display for this patient.   History reviewed. No pertinent surgical history.      Home Medications    Prior to Admission medications   Medication Sig Start Date End Date Taking? Authorizing Provider  cyclobenzaprine (FLEXERIL) 10 MG tablet Take 1 tablet (10 mg total) by mouth 3 (three) times daily as needed for muscle spasms. 07/05/17   Street, [place], PA-C  naproxen (NAPROSYN) 500 MG tablet Take 1 tablet (500 mg total) by mouth 2 (two) times daily as needed for mild pain, moderate pain or  headache (TAKE WITH MEALS.). 07/05/17   Street, [place], PA-C    Family History History reviewed. No pertinent family history.  Social History Social History   Tobacco Use   Smoking status: Current Every Day Smoker    Packs/day: 0.25   Smokeless tobacco: Never Used  Substance Use Topics   Alcohol use: Yes    Comment: social rare   Drug use: Yes    Types: Marijuana    Comment: last used yesterday     Allergies   Patient has no known allergies.   Review of Systems Review of Systems   Physical Exam Updated Vital Signs BP 127/90    Pulse 77    Temp 98.5 F (36.9 C) (Oral)    Resp (!) 30    Ht 5\' 9"  (1.753 m)    Wt 87.1 kg    SpO2 100%    BMI 28.35 kg/m   Physical Exam Constitutional:      General: He is not in acute distress. HENT:     Head: Normocephalic and atraumatic.     Mouth/Throat:     Mouth: Mucous membranes are moist.  Eyes:     General: Visual field deficit (suspect left sided visual field defect in right eye, unable to assess on left) present.     Comments: Conjunctiva injected bilaterally Pupils small and unable to assess reactivity bilaterally, equal  Neck:     Musculoskeletal: No neck rigidity.  Cardiovascular:     Rate and Rhythm: Normal rate and regular rhythm.     Heart sounds: No murmur. No friction rub. No gallop.   Pulmonary:     Effort: Pulmonary effort is normal.     Breath sounds: Normal breath sounds. No wheezing, rhonchi or rales.  Abdominal:     Palpations: Abdomen is soft.  Musculoskeletal:        General: No swelling or tenderness.  Skin:    General: Skin is warm and dry.     Coloration: Skin is not jaundiced.  Neurological:     Mental Status: He is alert and oriented to person, place, and time.     Cranial Nerves: Cranial nerve deficit (ptosis of right eye, CN III deficit, left sided faical droop) and facial asymmetry (left facial droop, right eye ptosis) present. No dysarthria (speaking clearly).     Sensory: Sensation is  intact.     Motor: Weakness (LUE) present. No tremor.     Comments: Possible left sided neglect on exam      ED Treatments / Results  Labs (all labs ordered are listed, but only abnormal results are displayed) Labs Reviewed  CBC - Abnormal; Notable for the following components:      Result Value   WBC 12.7 (*)    All other components within normal limits  DIFFERENTIAL - Abnormal; Notable for the following components:   Neutro Abs 10.3 (*)    All other components within normal limits  COMPREHENSIVE METABOLIC PANEL - Abnormal; Notable for the following components:   Glucose, Bld 155 (*)    All other components within normal limits  RAPID URINE DRUG SCREEN, HOSP PERFORMED - Abnormal; Notable for the following components:   Tetrahydrocannabinol POSITIVE (*)    All other components within normal limits  URINALYSIS, ROUTINE W REFLEX MICROSCOPIC - Abnormal; Notable for the following components:   Hgb urine dipstick SMALL (*)    All other components within normal limits  I-STAT CHEM 8, ED - Abnormal; Notable for the following components:   BUN 21 (*)    Glucose, Bld 149 (*)    All other components within normal limits  CBG MONITORING, ED - Abnormal; Notable for the following components:   Glucose-Capillary 147 (*)    All other components within normal limits  ETHANOL  PROTIME-INR  APTT    EKG None  Radiology Ct Code Stroke Cta Head W/wo Contrast  Result Date: 08/14/2018 CLINICAL DATA:  43 year old male code stroke. Left arm weakness, altered mental status. Clinically, a tip of the basilar occlusion is being question. EXAM: CT ANGIOGRAPHY HEAD AND NECK CT PERFUSION BRAIN TECHNIQUE: Multidetector CT imaging of the head and neck was performed using the standard protocol during bolus administration of intravenous contrast. Multiplanar CT image reconstructions and MIPs were obtained to evaluate the vascular anatomy. Carotid stenosis measurements (when applicable) are obtained utilizing  NASCET criteria, using the distal internal carotid diameter as the denominator. Multiphase CT imaging of the brain was performed following IV bolus contrast injection. Subsequent parametric perfusion maps were calculated using RAPID software. CONTRAST:  90mL OMNIPAQUE IOHEXOL 350 MG/ML SOLN COMPARISON:  Plain head CT 1144 hours today. FINDINGS: CT Brain Perfusion Findings: ASPECTS: 10 CBF (<30%) Volume: None Perfusion (Tmax>6.0s) volume: None Mismatch Volume: Not applicable Infarction Location:Not applicable CTA NECK Skeleton: No acute osseous abnormality identified. Scoliosis. Reversal of cervical lordosis. Upper chest: Negative lung apices and superior mediastinum. Other neck:  Negative. Aortic arch: 3 vessel arch configuration. No arch atherosclerosis or great vessel origin stenosis. Right carotid system: Negative. Left carotid system: The left CCA and left ICA are somewhat smaller than the right side, but otherwise negative. The left carotid bifurcation is negative. Vertebral arteries: Normal proximal right subclavian artery and right vertebral artery origin. Normal right vertebral artery to the skull base. Normal proximal left subclavian artery and left vertebral artery origin. The left vertebral artery appears mildly non dominant but otherwise normal to the skull base. CTA HEAD Posterior circulation: Dominant distal right vertebral artery. Patent PICA origins. No distal vertebral stenosis. Mildly dolichoectatic distal vertebral arteries and vertebrobasilar junction. Dolichoectatic basilar artery (5 millimeters diameter). The basilar tip is mildly tortuous. The right PCA is occluded at its origin (series 12, image 22, with chronic encephalomalacia noted throughout the right PCA territory. Both SCA and the left PCA origins are patent. There is no right PCA enhancement. The left PCA is mildly irregular at its origin and in the P2 segment, but there is distal left PCA enhancement. Anterior circulation: Both ICA  siphons are patent. The left appears non dominant, and the left ACA A1 segment is absent. The right A1 is dominant. The left ICA siphon is mildly irregular without significant stenosis. The right ICA siphon is also mildly irregular, but without discrete plaque. No right ICA siphon stenosis. Normal right ICA terminus, right MCA and dominant right A1 origin. The anterior communicating artery and bilateral ACA branches are within normal limits. Left MCA M1 segment and bifurcation are patent without stenosis. Left MCA branches are within normal limits. Right MCA M1 and bifurcation are patent without stenosis. Right MCA branches are within normal limits. Venous sinuses: Early contrast timing, not evaluated. Anatomic variants: Mildly dominant right vertebral artery. Dominant right and absent left ACA A1 segments with associated dominance of the right carotid. Review of the MIP images confirms the above findings IMPRESSION: 1. Negative for emergent large vessel occlusion, and no ischemia or core infarct detected by CT Perfusion. 2. Positive for chronic Right PCA occlusion at its origin, corresponding to chronic right PCA territory encephalomalacia. 3. Ectatic basilar artery. Mild irregularity of both ICA siphons and the Left PCA without significant stenosis. 4. No carotid or vertebral artery abnormality in the neck. These results were communicated to Dr. Otelia LimesLindzen at 12:20 pmon 08/14/2018 by text page via the [facility] messaging system. Electronically Signed   By: Odessa FlemingH  Hall M.D.   On: 08/14/2018 12:20   Ct Code Stroke Cta Neck W/wo Contrast  Result Date: 08/14/2018 CLINICAL DATA:  43 year old male code stroke. Left arm weakness, altered mental status. Clinically, a tip of the basilar occlusion is being question. EXAM: CT ANGIOGRAPHY HEAD AND NECK CT PERFUSION BRAIN TECHNIQUE: Multidetector CT imaging of the head and neck was performed using the standard protocol during bolus administration of intravenous contrast. Multiplanar CT  image reconstructions and MIPs were obtained to evaluate the vascular anatomy. Carotid stenosis measurements (when applicable) are obtained utilizing NASCET criteria, using the distal internal carotid diameter as the denominator. Multiphase CT imaging of the brain was performed following IV bolus contrast injection. Subsequent parametric perfusion maps were calculated using RAPID software. CONTRAST:  90mL OMNIPAQUE IOHEXOL 350 MG/ML SOLN COMPARISON:  Plain head CT 1144 hours today. FINDINGS: CT Brain Perfusion Findings: ASPECTS: 10 CBF (<30%) Volume: None Perfusion (Tmax>6.0s) volume: None Mismatch Volume: Not applicable Infarction Location:Not applicable CTA NECK Skeleton: No acute osseous abnormality identified. Scoliosis. Reversal of cervical lordosis. Upper chest: Negative lung  apices and superior mediastinum. Other neck: Negative. Aortic arch: 3 vessel arch configuration. No arch atherosclerosis or great vessel origin stenosis. Right carotid system: Negative. Left carotid system: The left CCA and left ICA are somewhat smaller than the right side, but otherwise negative. The left carotid bifurcation is negative. Vertebral arteries: Normal proximal right subclavian artery and right vertebral artery origin. Normal right vertebral artery to the skull base. Normal proximal left subclavian artery and left vertebral artery origin. The left vertebral artery appears mildly non dominant but otherwise normal to the skull base. CTA HEAD Posterior circulation: Dominant distal right vertebral artery. Patent PICA origins. No distal vertebral stenosis. Mildly dolichoectatic distal vertebral arteries and vertebrobasilar junction. Dolichoectatic basilar artery (5 millimeters diameter). The basilar tip is mildly tortuous. The right PCA is occluded at its origin (series 12, image 22, with chronic encephalomalacia noted throughout the right PCA territory. Both SCA and the left PCA origins are patent. There is no right PCA  enhancement. The left PCA is mildly irregular at its origin and in the P2 segment, but there is distal left PCA enhancement. Anterior circulation: Both ICA siphons are patent. The left appears non dominant, and the left ACA A1 segment is absent. The right A1 is dominant. The left ICA siphon is mildly irregular without significant stenosis. The right ICA siphon is also mildly irregular, but without discrete plaque. No right ICA siphon stenosis. Normal right ICA terminus, right MCA and dominant right A1 origin. The anterior communicating artery and bilateral ACA branches are within normal limits. Left MCA M1 segment and bifurcation are patent without stenosis. Left MCA branches are within normal limits. Right MCA M1 and bifurcation are patent without stenosis. Right MCA branches are within normal limits. Venous sinuses: Early contrast timing, not evaluated. Anatomic variants: Mildly dominant right vertebral artery. Dominant right and absent left ACA A1 segments with associated dominance of the right carotid. Review of the MIP images confirms the above findings IMPRESSION: 1. Negative for emergent large vessel occlusion, and no ischemia or core infarct detected by CT Perfusion. 2. Positive for chronic Right PCA occlusion at its origin, corresponding to chronic right PCA territory encephalomalacia. 3. Ectatic basilar artery. Mild irregularity of both ICA siphons and the Left PCA without significant stenosis. 4. No carotid or vertebral artery abnormality in the neck. These results were communicated to Dr. Otelia LimesLindzen at 12:20 pmon 08/14/2018 by text page via the [facility] messaging system. Electronically Signed   By: Odessa FlemingH  Hall M.D.   On: 08/14/2018 12:20   Ct Code Stroke Cta Cerebral Perfusion W/wo Contrast  Result Date: 08/14/2018 CLINICAL DATA:  43 year old male code stroke. Left arm weakness, altered mental status. Clinically, a tip of the basilar occlusion is being question. EXAM: CT ANGIOGRAPHY HEAD AND NECK CT PERFUSION  BRAIN TECHNIQUE: Multidetector CT imaging of the head and neck was performed using the standard protocol during bolus administration of intravenous contrast. Multiplanar CT image reconstructions and MIPs were obtained to evaluate the vascular anatomy. Carotid stenosis measurements (when applicable) are obtained utilizing NASCET criteria, using the distal internal carotid diameter as the denominator. Multiphase CT imaging of the brain was performed following IV bolus contrast injection. Subsequent parametric perfusion maps were calculated using RAPID software. CONTRAST:  90mL OMNIPAQUE IOHEXOL 350 MG/ML SOLN COMPARISON:  Plain head CT 1144 hours today. FINDINGS: CT Brain Perfusion Findings: ASPECTS: 10 CBF (<30%) Volume: None Perfusion (Tmax>6.0s) volume: None Mismatch Volume: Not applicable Infarction Location:Not applicable CTA NECK Skeleton: No acute osseous abnormality identified. Scoliosis. Reversal  of cervical lordosis. Upper chest: Negative lung apices and superior mediastinum. Other neck: Negative. Aortic arch: 3 vessel arch configuration. No arch atherosclerosis or great vessel origin stenosis. Right carotid system: Negative. Left carotid system: The left CCA and left ICA are somewhat smaller than the right side, but otherwise negative. The left carotid bifurcation is negative. Vertebral arteries: Normal proximal right subclavian artery and right vertebral artery origin. Normal right vertebral artery to the skull base. Normal proximal left subclavian artery and left vertebral artery origin. The left vertebral artery appears mildly non dominant but otherwise normal to the skull base. CTA HEAD Posterior circulation: Dominant distal right vertebral artery. Patent PICA origins. No distal vertebral stenosis. Mildly dolichoectatic distal vertebral arteries and vertebrobasilar junction. Dolichoectatic basilar artery (5 millimeters diameter). The basilar tip is mildly tortuous. The right PCA is occluded at its  origin (series 12, image 22, with chronic encephalomalacia noted throughout the right PCA territory. Both SCA and the left PCA origins are patent. There is no right PCA enhancement. The left PCA is mildly irregular at its origin and in the P2 segment, but there is distal left PCA enhancement. Anterior circulation: Both ICA siphons are patent. The left appears non dominant, and the left ACA A1 segment is absent. The right A1 is dominant. The left ICA siphon is mildly irregular without significant stenosis. The right ICA siphon is also mildly irregular, but without discrete plaque. No right ICA siphon stenosis. Normal right ICA terminus, right MCA and dominant right A1 origin. The anterior communicating artery and bilateral ACA branches are within normal limits. Left MCA M1 segment and bifurcation are patent without stenosis. Left MCA branches are within normal limits. Right MCA M1 and bifurcation are patent without stenosis. Right MCA branches are within normal limits. Venous sinuses: Early contrast timing, not evaluated. Anatomic variants: Mildly dominant right vertebral artery. Dominant right and absent left ACA A1 segments with associated dominance of the right carotid. Review of the MIP images confirms the above findings IMPRESSION: 1. Negative for emergent large vessel occlusion, and no ischemia or core infarct detected by CT Perfusion. 2. Positive for chronic Right PCA occlusion at its origin, corresponding to chronic right PCA territory encephalomalacia. 3. Ectatic basilar artery. Mild irregularity of both ICA siphons and the Left PCA without significant stenosis. 4. No carotid or vertebral artery abnormality in the neck. These results were communicated to Dr. Otelia Limes at 12:20 pmon 08/14/2018 by text page via the [facility] messaging system. Electronically Signed   By: Odessa Fleming M.D.   On: 08/14/2018 12:20   Ct Head Code Stroke Wo Contrast  Result Date: 08/14/2018 CLINICAL DATA:  Code stroke. 43 year old male with  left arm weakness. EXAM: CT HEAD WITHOUT CONTRAST TECHNIQUE: Contiguous axial images were obtained from the base of the skull through the vertex without intravenous contrast. COMPARISON:  None available. FINDINGS: Brain: Chronic encephalomalacia in the right PCA territory, including the right thalamus. No cortically based acute infarct identified. Outside of the right PCA territory gray-white matter differentiation appears symmetric and within normal limits. No acute intracranial hemorrhage identified. No midline shift, mass effect, or evidence of intracranial mass lesion. No ventriculomegaly. Vascular: Questionable asymmetric hyperdensity at the right ICA terminus and MCA origin on series 3, image 10. Skull: Negative. Congenital incomplete ossification of the posterior C1 ring. Sinuses/Orbits: Visualized paranasal sinuses and mastoids are stable and well pneumatized. Other: There is some rightward gaze deviation. No acute scalp soft tissue findings. ASPECTS [facility] Stroke Program Early CT Score) Total score (  0-10 with 10 being normal): 10 IMPRESSION: 1. Questionable asymmetric hyperdensity at the right ICA terminus and MCA origin, but no acute cortically based infarct or acute intracranial hemorrhage identified. ASPECTS 10. 2. Chronic right PCA territory infarct. 3. These results were communicated to Dr. Otelia LimesLindzen at 11:55 amon 8/2/2020by text page via the [facility] messaging system. Electronically Signed   By: Odessa FlemingH  Hall M.D.   On: 08/14/2018 11:56    Procedures Procedures (including critical care time)  Medications Ordered in ED Medications  aspirin suppository 600 mg (has no administration in time range)  clopidogrel (PLAVIX) tablet 75 mg (has no administration in time range)  iohexol (OMNIPAQUE) 350 MG/ML injection 90 mL (90 mLs Intravenous Contrast Given 08/14/18 1156)     Initial Impression / Assessment and Plan / ED Course  I have reviewed the triage vital signs and the nursing notes.  Pertinent labs  & imaging results that were available during my care of the patient were reviewed by me and considered in my medical decision making (see chart for details).  [age] yo male with hx right PCA CVA 02/2018 who presents with new neurologic deficits including left sided neglect, loss of balance, left sided facial droop, right facial droop, possible CN III deficit, LKN 2130 on 08/13/2018.  Code stroke called.  Neuro involved at 1122.  I-stat Chem 8 without electrolyte abnormalities.  EKG without STEMI. UA negative.  UDS positive for THC.  CT head with CTA head/neck with no new infarct, chronic right PCA occlusion with chronic right PCA encephalomalacia.  Per neuro, will await stat MRI read.  Patient and his mother updated on the plan.  Signed out to oncoming provider who will assume care for patient and f/u MRI.   Final Clinical Impressions(s) / ED Diagnoses   Final diagnoses:  None    ED Discharge Orders    None       Unknown JimMeccariello, Loann Chahal J, DO 08/14/18 1503    Alvira MondaySchlossman, Erin, MD 08/15/18 1038

## 2018-08-14 NOTE — ED Notes (Signed)
Call [facility] when pt gets a room [phone]

## 2018-08-14 NOTE — ED Notes (Signed)
ED TO INPATIENT HANDOFF REPORT  ED Nurse Name and Phone #: Magnus Ivan, RN [phone]  S Name/Age/Gender Todd Hood 43 y.o. male Room/Bed: 018C/018C  Code Status   Code Status: Not on file  Home/SNF/Other Home Patient oriented to: situation Is this baseline? No   Triage Complete: Triage complete  Chief Complaint stroke like sx  Triage Note Pt arrives with [facility] from home c/o dizziness that started yesterday that has increased this morning. Pt attempted to get up this morning at 0900 and states he "felt like he was going to fall over." Pt has hx of stroke in February 2020. 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 Per EMS, LVO is 2. Upon inspection, smile is asymmetrical. LKW 2130 last night. Pt states he recently stopped taking plavix in preparation for oral surgery.  EMS vitals:  99% O2 on RA RR 18 119/70 HR 72 CBG 168 97.1 temp     Allergies No Known Allergies  Level of Care/Admitting Diagnosis ED Disposition    ED Disposition Condition Comment   Admit  The patient appears reasonably stabilized for admission considering the current resources, flow, and capabilities available in the ED at this time, and I doubt any other [facility] requiring further screening and/or treatment in the ED prior to admission is  present.       B Medical/Surgery History Past Medical History:  Diagnosis Date  . CVA (cerebral vascular accident) (HCC)   . Hypertension    History reviewed. No pertinent surgical history.   A IV Location/Drains/Wounds Patient Lines/Drains/Airways Status   Active Line/Drains/Airways    Name:   Placement date:   Placement time:   Site:   Days:   Peripheral IV 08/14/18 Right Hand   08/14/18    1049    Hand   less than 1   Peripheral IV 08/14/18 Right Antecubital   08/14/18    1123    Antecubital   less than 1          Intake/Output Last  24 hours No intake or output data in the 24 hours ending 08/14/18 1646  Labs/Imaging Results for orders placed or performed during the hospital encounter of 08/14/18 (from the past 48 hour(s))  Ethanol     Status: None   Collection Time: 08/14/18 11:06 AM  Result Value Ref Range   Alcohol, Ethyl (B) <10 <10 mg/dL    Comment: (NOTE) Lowest detectable limit for serum alcohol is 10 mg/dL. For medical purposes only. Performed at [facility], [address] [place], [place] [address]   Protime-INR     Status: None   Collection Time: 08/14/18 11:06 AM  Result Value Ref Range   Prothrombin Time 13.5 11.4 - 15.2 seconds   INR 1.0 0.8 - 1.2    Comment: (NOTE) INR goal varies based on device and disease states. Performed at [facility], [address], [place], [place] [address]   APTT     Status: None   Collection Time: 08/14/18 11:06 AM  Result Value Ref Range   aPTT 26 24 - 36 seconds    Comment: Performed at [facility], [address] [place], [place] [address]  CBC     Status: Abnormal   Collection Time: 08/14/18 11:06 AM  Result Value Ref Range   WBC 12.7 (H) 4.0 - 10.5 K/uL  RBC 4.89 4.22 - 5.81 MIL/uL   Hemoglobin 15.4 13.0 - 17.0 g/dL   HCT 60.4 54.0 - 98.1 %   MCV 88.3 80.0 - 100.0 fL   MCH 31.5 26.0 - 34.0 pg   MCHC 35.6 30.0 - 36.0 g/dL   RDW 19.1 47.8 - 29.5 %   Platelets 185 150 - 400 K/uL   nRBC 0.0 0.0 - 0.2 %    Comment: Performed at [facility], [address], [place], [place] [address]  Differential     Status: Abnormal   Collection Time: 08/14/18 11:06 AM  Result Value Ref Range   Neutrophils Relative % 81 %   Neutro Abs 10.3 (H) 1.7 - 7.7 K/uL   Lymphocytes Relative 11 %   Lymphs Abs 1.4 0.7 - 4.0 K/uL   Monocytes Relative 8 %   Monocytes Absolute 1.0 0.1 - 1.0 K/uL   Eosinophils Relative 0 %   Eosinophils Absolute 0.0 0.0 - 0.5 K/uL   Basophils Relative 0 %   Basophils Absolute 0.0 0.0 - 0.1 K/uL   Immature  Granulocytes 0 %   Abs Immature Granulocytes 0.05 0.00 - 0.07 K/uL    Comment: Performed at [facility], [address] [place], [place] [address]  Comprehensive metabolic panel     Status: Abnormal   Collection Time: 08/14/18 11:06 AM  Result Value Ref Range   Sodium 141 135 - 145 mmol/L   Potassium 3.6 3.5 - 5.1 mmol/L   Chloride 108 98 - 111 mmol/L   CO2 24 22 - 32 mmol/L   Glucose, Bld 155 (H) 70 - 99 mg/dL   BUN 19 6 - 20 mg/dL   Creatinine, Ser 4.69 0.61 - 1.24 mg/dL   Calcium 9.3 8.9 - 62.9 mg/dL   Total Protein 7.2 6.5 - 8.1 g/dL   Albumin 4.0 3.5 - 5.0 g/dL   AST 18 15 - 41 U/L   ALT 30 0 - 44 U/L   Alkaline Phosphatase 111 38 - 126 U/L   Total Bilirubin 0.7 0.3 - 1.2 mg/dL   GFR calc non Af Amer >60 >60 mL/min   GFR calc Af Amer >60 >60 mL/min   Anion gap 9 5 - 15    Comment: Performed at [facility], [address], [place], [place] [address]  I-stat chem 8, ED     Status: Abnormal   Collection Time: 08/14/18 11:23 AM  Result Value Ref Range   Sodium 143 135 - 145 mmol/L   Potassium 3.7 3.5 - 5.1 mmol/L   Chloride 107 98 - 111 mmol/L   BUN 21 (H) 6 - 20 mg/dL   Creatinine, Ser 3.24 0.61 - 1.24 mg/dL   Glucose, Bld 401 (H) 70 - 99 mg/dL   Calcium, Ion 0.27 2.53 - 1.40 mmol/L   TCO2 25 22 - 32 mmol/L   Hemoglobin 15.3 13.0 - 17.0 g/dL   HCT 66.4 40.3 - 47.4 %  CBG monitoring, ED     Status: Abnormal   Collection Time: 08/14/18 11:31 AM  Result Value Ref Range   Glucose-Capillary 147 (H) 70 - 99 mg/dL   Comment 1 Notify RN    Comment 2 Document in Chart   Urine rapid drug screen (hosp performed)     Status: Abnormal   Collection Time: 08/14/18 11:33 AM  Result Value Ref Range   Opiates NONE DETECTED NONE DETECTED   Cocaine NONE DETECTED NONE DETECTED   Benzodiazepines NONE DETECTED  NONE DETECTED   Amphetamines NONE DETECTED NONE DETECTED   Tetrahydrocannabinol POSITIVE (A) NONE DETECTED   Barbiturates NONE DETECTED NONE DETECTED    Comment:  (NOTE) DRUG SCREEN FOR MEDICAL PURPOSES ONLY.  IF CONFIRMATION IS NEEDED FOR ANY PURPOSE, NOTIFY LAB WITHIN 5 DAYS. LOWEST DETECTABLE LIMITS FOR URINE DRUG SCREEN Drug Class                     Cutoff (ng/mL) Amphetamine and metabolites    1000 Barbiturate and metabolites    200 Benzodiazepine                 200 Tricyclics and metabolites     300 Opiates and metabolites        300 Cocaine and metabolites        300 THC                            50 Performed at [facility], [address], [place], [place] [address]   Urinalysis, Routine w reflex microscopic     Status: Abnormal   Collection Time: 08/14/18 11:33 AM  Result Value Ref Range   Color, Urine YELLOW YELLOW   APPearance CLEAR CLEAR   Specific Gravity, Urine 1.020 1.005 - 1.030   pH 6.0 5.0 - 8.0   Glucose, UA NEGATIVE NEGATIVE mg/dL   Hgb urine dipstick SMALL (A) NEGATIVE   Bilirubin Urine NEGATIVE NEGATIVE   Ketones, ur NEGATIVE NEGATIVE mg/dL   Protein, ur NEGATIVE NEGATIVE mg/dL   Nitrite NEGATIVE NEGATIVE   Leukocytes,Ua NEGATIVE NEGATIVE   RBC / HPF 11-20 0 - 5 RBC/hpf   WBC, UA 0-5 0 - 5 WBC/hpf   Bacteria, UA NONE SEEN NONE SEEN   Squamous Epithelial / LPF 0-5 0 - 5   Mucus PRESENT     Comment: Performed at [facility], [address] [place], [place] [address]   Ct Code Stroke Cta Head W/wo Contrast  Result Date: 08/14/2018 CLINICAL DATA:  43 year old male code stroke. Left arm weakness, altered mental status. Clinically, a tip of the basilar occlusion is being question. EXAM: CT ANGIOGRAPHY HEAD AND NECK CT PERFUSION BRAIN TECHNIQUE: Multidetector CT imaging of the head and neck was performed using the standard protocol during bolus administration of intravenous contrast. Multiplanar CT image reconstructions and MIPs were obtained to evaluate the vascular anatomy. Carotid stenosis measurements (when applicable) are obtained utilizing NASCET criteria, using the distal internal carotid diameter  as the denominator. Multiphase CT imaging of the brain was performed following IV bolus contrast injection. Subsequent parametric perfusion maps were calculated using RAPID software. CONTRAST:  90mL OMNIPAQUE IOHEXOL 350 MG/ML SOLN COMPARISON:  Plain head CT 1144 hours today. FINDINGS: CT Brain Perfusion Findings: ASPECTS: 10 CBF (<30%) Volume: None Perfusion (Tmax>6.0s) volume: None Mismatch Volume: Not applicable Infarction Location:Not applicable CTA NECK Skeleton: No acute osseous abnormality identified. Scoliosis. Reversal of cervical lordosis. Upper chest: Negative lung apices and superior mediastinum. Other neck: Negative. Aortic arch: 3 vessel arch configuration. No arch atherosclerosis or great vessel origin stenosis. Right carotid system: Negative. Left carotid system: The left CCA and left ICA are somewhat smaller than the right side, but otherwise negative. The left carotid bifurcation is negative. Vertebral arteries: Normal proximal right subclavian artery and right vertebral artery origin. Normal right vertebral artery to the skull base. Normal proximal left subclavian artery and left vertebral artery origin. The left vertebral artery appears  mildly non dominant but otherwise normal to the skull base. CTA HEAD Posterior circulation: Dominant distal right vertebral artery. Patent PICA origins. No distal vertebral stenosis. Mildly dolichoectatic distal vertebral arteries and vertebrobasilar junction. Dolichoectatic basilar artery (5 millimeters diameter). The basilar tip is mildly tortuous. The right PCA is occluded at its origin (series 12, image 22, with chronic encephalomalacia noted throughout the right PCA territory. Both SCA and the left PCA origins are patent. There is no right PCA enhancement. The left PCA is mildly irregular at its origin and in the P2 segment, but there is distal left PCA enhancement. Anterior circulation: Both ICA siphons are patent. The left appears non dominant, and the left  ACA A1 segment is absent. The right A1 is dominant. The left ICA siphon is mildly irregular without significant stenosis. The right ICA siphon is also mildly irregular, but without discrete plaque. No right ICA siphon stenosis. Normal right ICA terminus, right MCA and dominant right A1 origin. The anterior communicating artery and bilateral ACA branches are within normal limits. Left MCA M1 segment and bifurcation are patent without stenosis. Left MCA branches are within normal limits. Right MCA M1 and bifurcation are patent without stenosis. Right MCA branches are within normal limits. Venous sinuses: Early contrast timing, not evaluated. Anatomic variants: Mildly dominant right vertebral artery. Dominant right and absent left ACA A1 segments with associated dominance of the right carotid. Review of the MIP images confirms the above findings IMPRESSION: 1. Negative for emergent large vessel occlusion, and no ischemia or core infarct detected by CT Perfusion. 2. Positive for chronic Right PCA occlusion at its origin, corresponding to chronic right PCA territory encephalomalacia. 3. Ectatic basilar artery. Mild irregularity of both ICA siphons and the Left PCA without significant stenosis. 4. No carotid or vertebral artery abnormality in the neck. These results were communicated to Dr. Cheral Marker at 12:20 pmon 08/14/2018 by text page via the [facility] messaging system. Electronically Signed   By: Genevie Ann M.D.   On: 08/14/2018 12:20   Ct Code Stroke Cta Neck W/wo Contrast  Result Date: 08/14/2018 CLINICAL DATA:  43 year old male code stroke. Left arm weakness, altered mental status. Clinically, a tip of the basilar occlusion is being question. EXAM: CT ANGIOGRAPHY HEAD AND NECK CT PERFUSION BRAIN TECHNIQUE: Multidetector CT imaging of the head and neck was performed using the standard protocol during bolus administration of intravenous contrast. Multiplanar CT image reconstructions and MIPs were obtained to evaluate the  vascular anatomy. Carotid stenosis measurements (when applicable) are obtained utilizing NASCET criteria, using the distal internal carotid diameter as the denominator. Multiphase CT imaging of the brain was performed following IV bolus contrast injection. Subsequent parametric perfusion maps were calculated using RAPID software. CONTRAST:  58mL OMNIPAQUE IOHEXOL 350 MG/ML SOLN COMPARISON:  Plain head CT 1144 hours today. FINDINGS: CT Brain Perfusion Findings: ASPECTS: 10 CBF (<30%) Volume: None Perfusion (Tmax>6.0s) volume: None Mismatch Volume: Not applicable Infarction Location:Not applicable CTA NECK Skeleton: No acute osseous abnormality identified. Scoliosis. Reversal of cervical lordosis. Upper chest: Negative lung apices and superior mediastinum. Other neck: Negative. Aortic arch: 3 vessel arch configuration. No arch atherosclerosis or great vessel origin stenosis. Right carotid system: Negative. Left carotid system: The left CCA and left ICA are somewhat smaller than the right side, but otherwise negative. The left carotid bifurcation is negative. Vertebral arteries: Normal proximal right subclavian artery and right vertebral artery origin. Normal right vertebral artery to the skull base. Normal proximal left subclavian artery and left vertebral artery  origin. The left vertebral artery appears mildly non dominant but otherwise normal to the skull base. CTA HEAD Posterior circulation: Dominant distal right vertebral artery. Patent PICA origins. No distal vertebral stenosis. Mildly dolichoectatic distal vertebral arteries and vertebrobasilar junction. Dolichoectatic basilar artery (5 millimeters diameter). The basilar tip is mildly tortuous. The right PCA is occluded at its origin (series 12, image 22, with chronic encephalomalacia noted throughout the right PCA territory. Both SCA and the left PCA origins are patent. There is no right PCA enhancement. The left PCA is mildly irregular at its origin and in the  P2 segment, but there is distal left PCA enhancement. Anterior circulation: Both ICA siphons are patent. The left appears non dominant, and the left ACA A1 segment is absent. The right A1 is dominant. The left ICA siphon is mildly irregular without significant stenosis. The right ICA siphon is also mildly irregular, but without discrete plaque. No right ICA siphon stenosis. Normal right ICA terminus, right MCA and dominant right A1 origin. The anterior communicating artery and bilateral ACA branches are within normal limits. Left MCA M1 segment and bifurcation are patent without stenosis. Left MCA branches are within normal limits. Right MCA M1 and bifurcation are patent without stenosis. Right MCA branches are within normal limits. Venous sinuses: Early contrast timing, not evaluated. Anatomic variants: Mildly dominant right vertebral artery. Dominant right and absent left ACA A1 segments with associated dominance of the right carotid. Review of the MIP images confirms the above findings IMPRESSION: 1. Negative for emergent large vessel occlusion, and no ischemia or core infarct detected by CT Perfusion. 2. Positive for chronic Right PCA occlusion at its origin, corresponding to chronic right PCA territory encephalomalacia. 3. Ectatic basilar artery. Mild irregularity of both ICA siphons and the Left PCA without significant stenosis. 4. No carotid or vertebral artery abnormality in the neck. These results were communicated to Dr. Otelia LimesLindzen at 12:20 pmon 08/14/2018 by text page via the [facility] messaging system. Electronically Signed   By: Odessa FlemingH  Hall M.D.   On: 08/14/2018 12:20   Mr Brain Wo Contrast  Result Date: 08/14/2018 CLINICAL DATA:  Dizziness which began yesterday has increased this morning. Imbalance. Recent stroke February 2020. Difficulty opening RIGHT eye. Recently stopped taking Plavix in preparation for oral surgery. EXAM: MRI HEAD WITHOUT CONTRAST TECHNIQUE: Multiplanar, multiecho pulse sequences of the  brain and surrounding structures were obtained without intravenous contrast. COMPARISON:  Code stroke CT and CTA head neck earlier today. FINDINGS: Brain: Pleomorphic contiguous area(s) of restricted diffusion, corresponding low ADC, affect the RIGHT cerebral peduncle, RIGHT paramedian midbrain extending to the aqueduct, medial RIGHT thalamus, as well as posterior limb and genu internal capsule, also on the RIGHT consistent with acute nonhemorrhagic infarction. Large area of encephalomalacia, representing a chronic PCA infarct affects the RIGHT occipital lobe and RIGHT posterior temporal lobe. No other areas of chronic infarction. Normal for age cerebral volume. T2 and FLAIR hyperintensities in the white matter, likely small vessel disease. Hemosiderin deposition in the chronic RIGHT occipital infarct, with additional punctate areas of susceptibility LEFT frontal white matter. Vascular: Flow voids are maintained. Marked dolichoectasia, likely hypertensive in origin. Skull and upper cervical spine: Normal marrow signal. Sinuses/Orbits: Negative. Other: None. IMPRESSION: Acute nonhemorrhagic RIGHT thalamic, midbrain, and internal capsule infarcts. Chronic RIGHT occipital and posterior temporal PCA territory infarcts. Mild small vessel disease. Electronically Signed   By: Elsie StainJohn T Curnes M.D.   On: 08/14/2018 15:59   Ct Code Stroke Cta Cerebral Perfusion W/wo Contrast  Result Date:  08/14/2018 CLINICAL DATA:  43 year old male code stroke. Left arm weakness, altered mental status. Clinically, a tip of the basilar occlusion is being question. EXAM: CT ANGIOGRAPHY HEAD AND NECK CT PERFUSION BRAIN TECHNIQUE: Multidetector CT imaging of the head and neck was performed using the standard protocol during bolus administration of intravenous contrast. Multiplanar CT image reconstructions and MIPs were obtained to evaluate the vascular anatomy. Carotid stenosis measurements (when applicable) are obtained utilizing NASCET  criteria, using the distal internal carotid diameter as the denominator. Multiphase CT imaging of the brain was performed following IV bolus contrast injection. Subsequent parametric perfusion maps were calculated using RAPID software. CONTRAST:  90mL OMNIPAQUE IOHEXOL 350 MG/ML SOLN COMPARISON:  Plain head CT 1144 hours today. FINDINGS: CT Brain Perfusion Findings: ASPECTS: 10 CBF (<30%) Volume: None Perfusion (Tmax>6.0s) volume: None Mismatch Volume: Not applicable Infarction Location:Not applicable CTA NECK Skeleton: No acute osseous abnormality identified. Scoliosis. Reversal of cervical lordosis. Upper chest: Negative lung apices and superior mediastinum. Other neck: Negative. Aortic arch: 3 vessel arch configuration. No arch atherosclerosis or great vessel origin stenosis. Right carotid system: Negative. Left carotid system: The left CCA and left ICA are somewhat smaller than the right side, but otherwise negative. The left carotid bifurcation is negative. Vertebral arteries: Normal proximal right subclavian artery and right vertebral artery origin. Normal right vertebral artery to the skull base. Normal proximal left subclavian artery and left vertebral artery origin. The left vertebral artery appears mildly non dominant but otherwise normal to the skull base. CTA HEAD Posterior circulation: Dominant distal right vertebral artery. Patent PICA origins. No distal vertebral stenosis. Mildly dolichoectatic distal vertebral arteries and vertebrobasilar junction. Dolichoectatic basilar artery (5 millimeters diameter). The basilar tip is mildly tortuous. The right PCA is occluded at its origin (series 12, image 22, with chronic encephalomalacia noted throughout the right PCA territory. Both SCA and the left PCA origins are patent. There is no right PCA enhancement. The left PCA is mildly irregular at its origin and in the P2 segment, but there is distal left PCA enhancement. Anterior circulation: Both ICA siphons  are patent. The left appears non dominant, and the left ACA A1 segment is absent. The right A1 is dominant. The left ICA siphon is mildly irregular without significant stenosis. The right ICA siphon is also mildly irregular, but without discrete plaque. No right ICA siphon stenosis. Normal right ICA terminus, right MCA and dominant right A1 origin. The anterior communicating artery and bilateral ACA branches are within normal limits. Left MCA M1 segment and bifurcation are patent without stenosis. Left MCA branches are within normal limits. Right MCA M1 and bifurcation are patent without stenosis. Right MCA branches are within normal limits. Venous sinuses: Early contrast timing, not evaluated. Anatomic variants: Mildly dominant right vertebral artery. Dominant right and absent left ACA A1 segments with associated dominance of the right carotid. Review of the MIP images confirms the above findings IMPRESSION: 1. Negative for emergent large vessel occlusion, and no ischemia or core infarct detected by CT Perfusion. 2. Positive for chronic Right PCA occlusion at its origin, corresponding to chronic right PCA territory encephalomalacia. 3. Ectatic basilar artery. Mild irregularity of both ICA siphons and the Left PCA without significant stenosis. 4. No carotid or vertebral artery abnormality in the neck. These results were communicated to Dr. Otelia Limes at 12:20 pmon 08/14/2018 by text page via the [facility] messaging system. Electronically Signed   By: Odessa Fleming M.D.   On: 08/14/2018 12:20   Ct Head Code Stroke  Wo Contrast  Result Date: 08/14/2018 CLINICAL DATA:  Code stroke. 43 year old male with left arm weakness. EXAM: CT HEAD WITHOUT CONTRAST TECHNIQUE: Contiguous axial images were obtained from the base of the skull through the vertex without intravenous contrast. COMPARISON:  None available. FINDINGS: Brain: Chronic encephalomalacia in the right PCA territory, including the right thalamus. No cortically based acute  infarct identified. Outside of the right PCA territory gray-white matter differentiation appears symmetric and within normal limits. No acute intracranial hemorrhage identified. No midline shift, mass effect, or evidence of intracranial mass lesion. No ventriculomegaly. Vascular: Questionable asymmetric hyperdensity at the right ICA terminus and MCA origin on series 3, image 10. Skull: Negative. Congenital incomplete ossification of the posterior C1 ring. Sinuses/Orbits: Visualized paranasal sinuses and mastoids are stable and well pneumatized. Other: There is some rightward gaze deviation. No acute scalp soft tissue findings. ASPECTS [facility]([place] Stroke Program Early CT Score) Total score (0-10 with 10 being normal): 10 IMPRESSION: 1. Questionable asymmetric hyperdensity at the right ICA terminus and MCA origin, but no acute cortically based infarct or acute intracranial hemorrhage identified. ASPECTS 10. 2. Chronic right PCA territory infarct. 3. These results were communicated to Dr. Otelia LimesLindzen at 11:55 amon 8/2/2020by text page via the [facility] messaging system. Electronically Signed   By: Odessa FlemingH  Hall M.D.   On: 08/14/2018 11:56    Pending Labs Unresulted Labs (From admission, onward)    Start     Ordered   08/14/18 1236  SARS CORONAVIRUS 2 Nasal Swab Aptima Multi Swab  (Asymptomatic Patients Labs)  Once,   STAT    Question Answer Comment  Is this test for diagnosis or screening Screening   Symptomatic for COVID-19 as defined by CDC No   Hospitalized for COVID-19 No   Admitted to ICU for COVID-19 No   Previously tested for COVID-19 No   Resident in a congregate (group) care setting No   Employed in healthcare setting No      08/14/18 1235          Vitals/Pain Today's Vitals   08/14/18 1415 08/14/18 1615 08/14/18 1618 08/14/18 1619  BP: (!) 119/91 (!) 134/96    Pulse: 82  81   Resp: 19  (!) 23   Temp:      TempSrc:      SpO2: 99%  100%   Weight:      Height:      PainSc:    0-No pain     Isolation Precautions No active isolations  Medications Medications  clopidogrel (PLAVIX) tablet 75 mg (75 mg Oral Not Given 08/14/18 1401)  iohexol (OMNIPAQUE) 350 MG/ML injection 90 mL (90 mLs Intravenous Contrast Given 08/14/18 1156)  aspirin suppository 600 mg (600 mg Rectal Given 08/14/18 1347)    Mobility walks with person assist Moderate fall risk   Focused Assessments Neuro Assessment Handoff:  Swallow screen pass? No  Cardiac Rhythm: Normal sinus rhythm NIH Stroke Scale ( + Modified Stroke Scale Criteria)  Interval: Initial Level of Consciousness (1a.)   : Not alert, but arousable by minor stimulation to obey, answer, or respond LOC Questions (1b. )   +: Answers both questions correctly LOC Commands (1c. )   + : Performs both tasks correctly Best Gaze (2. )  +: Partial gaze palsy Visual (3. )  +: Partial hemianopia Facial Palsy (4. )    : Minor paralysis Motor Arm, Left (5a. )   +: Drift Motor Arm, Right (5b. )   +: No drift Motor Leg,  Left (6a. )   +: Drift Motor Leg, Right (6b. )   +: No drift Limb Ataxia (7. ): Present in one limb Sensory (8. )   +: Normal, no sensory loss Best Language (9. )   +: No aphasia Dysarthria (10. ): Mild-to-moderate dysarthria, patient slurs at least some words and, at worst, can be understood with some difficulty Extinction/Inattention (11.)   +: No Abnormality Modified SS Total  +: 4 Complete NIHSS TOTAL: 9 Last date known well: 08/13/18 Last time known well: 2100 Neuro Assessment: Exceptions to WDL Neuro Checks:   Initial (08/14/18 1145)  Last Documented NIHSS Modified Score: 4 (08/14/18 1619) Has TPA been given? No If patient is a Neuro Trauma and patient is going to OR before floor call report to 4N Charge nurse: [phone] or [phone]     R Recommendations: See Admitting Provider Note  Report given to:   Additional Notes:

## 2018-08-14 NOTE — Progress Notes (Signed)
43 y.o. male. PMH HTN, HLD, hx stroke 02/2018 with visual impairment and left sided weakness. Presents via EMS with reports that last night at 9:30pm he started to have some dizziness.  This AM upon waking around 0900, he started to have loss of balance, slurred speech, head ache, and difficulty opening right eye.Pt also fell and hit left shoulder and above right eyebrow per Mother. Patient notes that he has been without Plavix for the last month.  CBG 168. CT and CTA completed STAT. Negative for acute bleed per Neurologist Dr. Cheral Marker. NIHSS completed yielding 9.  Pt out of window for TPA, Per Neurology will admit w/ MRI and stroke work up.

## 2018-08-15 ENCOUNTER — Inpatient Hospital Stay (HOSPITAL_COMMUNITY): Payer: Medicaid Other

## 2018-08-15 ENCOUNTER — Encounter (HOSPITAL_COMMUNITY): Payer: Self-pay

## 2018-08-15 DIAGNOSIS — I639 Cerebral infarction, unspecified: Secondary | ICD-10-CM

## 2018-08-15 DIAGNOSIS — I10 Essential (primary) hypertension: Secondary | ICD-10-CM | POA: Diagnosis present

## 2018-08-15 DIAGNOSIS — F1290 Cannabis use, unspecified, uncomplicated: Secondary | ICD-10-CM | POA: Diagnosis present

## 2018-08-15 DIAGNOSIS — R7303 Prediabetes: Secondary | ICD-10-CM | POA: Diagnosis present

## 2018-08-15 LAB — ECHOCARDIOGRAM COMPLETE
Height: 69 in
Weight: 3231.06 oz

## 2018-08-15 LAB — HEMOGLOBIN A1C
Hgb A1c MFr Bld: 6.2 % — ABNORMAL HIGH (ref 4.8–5.6)
Mean Plasma Glucose: 131.24 mg/dL

## 2018-08-15 MED ORDER — SODIUM CHLORIDE 0.9 % NICU IV INFUSION SIMPLE
INJECTION | INTRAVENOUS | Status: DC
  Administered 2018-08-15: 100 mL/h via INTRAVENOUS
  Filled 2018-08-15 (×5): qty 500

## 2018-08-15 MED ORDER — ATORVASTATIN CALCIUM 80 MG PO TABS
80.0000 mg | ORAL_TABLET | Freq: Every day | ORAL | Status: DC
  Administered 2018-08-15 – 2018-08-16 (×2): 80 mg via ORAL
  Filled 2018-08-15 (×2): qty 1

## 2018-08-15 MED ORDER — ADULT MULTIVITAMIN W/MINERALS CH
1.0000 | ORAL_TABLET | Freq: Every day | ORAL | Status: DC
  Administered 2018-08-15 – 2018-08-17 (×3): 1 via ORAL
  Filled 2018-08-15 (×4): qty 1

## 2018-08-15 MED ORDER — SODIUM CHLORIDE 0.9 % IV SOLN
INTRAVENOUS | Status: AC
  Administered 2018-08-15 – 2018-08-16 (×2): via INTRAVENOUS

## 2018-08-15 NOTE — Progress Notes (Signed)
Rehab Admissions Coordinator Note:  Per OT recommendation, this patient was screened by Jhonnie Garner for appropriateness for an Inpatient Acute Rehab Consult.  At this time, we are recommending Inpatient Rehab consult. AC will contact MD to request order.   Jhonnie Garner 08/15/2018, 11:46 AM  I can be reached at [phone].

## 2018-08-15 NOTE — Progress Notes (Signed)
  Echocardiogram 2D Echocardiogram has been performed.  Bobbye Charleston 08/15/2018, 11:30 AM

## 2018-08-15 NOTE — Progress Notes (Addendum)
    [facility] has been requested to perform a transesophageal echocardiogram on Todd Hood for acute stroke.  After careful review of history and examination, the risks and benefits of transesophageal echocardiogram have been explained including risks of esophageal damage, perforation (1:10,000 risk), bleeding, pharyngeal hematoma as well as other potential complications associated with conscious sedation including aspiration, arrhythmia, respiratory failure and death. Alternatives to treatment were discussed, questions were answered.   Patient did NOT want to consent for the procedure at this time. He is concerned about something going down his throat and states that sounds unpleasant. I explained that the patient would be sedated and would not really be aware of this. I also explained the importance of this procedure for further work-up of his stroke. Patient still not ready to consent at this time.  Procedure is currently scheduled for 08/16/2018 at 9:00am with Dr. Stanford Breed. Will leave him on the schedule for now and make him NPO at midnight. Will see patient again tomorrow to check back in.  Darreld Mclean, PA-C  08/15/2018 3:15 PM

## 2018-08-15 NOTE — Progress Notes (Signed)
TCD bubble study has been completed.   Preliminary results in CV Proc.   Abram Sander 08/15/2018 2:31 PM

## 2018-08-15 NOTE — Progress Notes (Signed)
PROGRESS NOTE    Patient: Todd Hood                            PCP: Patient, No Pcp Per                    DOB: 11/13/1975            DOA: 08/14/2018 [id number]             DOS: 08/15/2018, 7:35 AM   LOS: 1 day   Date of Service: The patient was seen and examined on 08/15/2018  Subjective:   The patient was seen and examined this AM. Stable in NAD, reporting no progression of his symptoms. The patient was noted for significant right upper droop, left-sided weakness but able to move all 4 extremities, with notable left-sided weakness. Currently denies any headaches, some double vision denies any shortness of breath or chest pain.  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 Leim FabryLeeann Willis) left-sided weakness-supposed to be on aspirin Plavix-stop taking Plavix 3 weeks ago because of upcoming dental procedure which has not yet been scheduled Significant other give some of the history-tells me was at patient's mother's house 8/1 for dinner seemed a little lightheaded?  Heatstroke-significant other was concerned-(as a CNA) did not sleep well and left for her work at facility-received a call 9:30 AM that patient had fallen out of the chair while watching TV and had difficulty opening the right eye He endorses taking all of his prior meds other than his Plavix as dictated above Significant got other got concerned because of the fall some slurred speech-by report smile is asymmetric Code stroke called 1106  Neurology saw the patient in the ED  Coronavirus screen is Negative   ED Course: Patient given 600 mg rectal aspirin suppository started on Plavix 75 daily swallow screen done in the ED  was cleared MRI CT done as below  Assessment & Plan:   Active Problems:   Stroke (cerebrum) (HCC)   Acute nonhemorrhagic right sided thalamic internal capsule stroke -Remains to have significant right facial droop, right eyelid droop.  Left-sided weakness.  Per patient no progression of his symptoms. -Pending neurology evaluation, suspecting resuming aspirin Plavix -CTA/MRI was reviewed -Pending 2D echocardiogram  -F/up labs: A1c, lipid panel, further management as per stroke service  Contusion right side head -Place ice ice pack -Stable  Mild AKI -Monitoring closely Starting IV fluids as above- -may not use hypertensive agents in the future which can cause azotemia  Visual loss left side -Outpatient follow-up with optometrist already arranged-needs to keep appointments -Neurology to comment on safety regarding driving going forward  HTN Meds to be reconciled but include apparently amlodipine 10 losartan 50 which should be continued  Impaired glucose tolerance - Check A1c 6.2 -Prediabetic, diabetic diet recommended -SSI  Substance abuse -mainly marijuana -Patient advised to quit his habits -He denies tobacco abuse and alcohol   Cultures; None  Antimicrobials: None   DVT prophylaxis: SCD/Compression stockings and Lovenox SQ Code Status:   Code Status: Full Code Family Communication: No family member present at bedside- attempt will be made to update daily The above findings and plan of care has been discussed with patientin  detail,  they expressed understanding and agreement of above. Disposition Plan:  >3 days Home with HH Vs SNF  Admission status:  NPATIEN -   Consultants: Neurology    Imaging studies:  CTA head:   IMPRESSION: 1. Negative for emergent large vessel occlusion, and no ischemia or core infarct detected by CT Perfusion. 2. Positive for chronic Right PCA occlusion at its origin, corresponding to chronic right PCA territory encephalomalacia.  3. Ectatic basilar artery. Mild irregularity of both ICA siphons and the Left PCA without significant stenosis. 4. No carotid or vertebral artery abnormality in the neck.  MRI head:  IMPRESSION: Acute nonhemorrhagic RIGHT thalamic, midbrain, and internal capsule infarcts.  Chronic RIGHT occipital and posterior temporal PCA territory infarcts.  Mild small vessel disease.   Medical tests:   EKG independently reviewed: Sinus rhythm PR interval 0.08 QRS axis 50-60 no ST-T wave changes    Procedures:   No admission procedures for hospital encounter.     Antimicrobials:  Anti-infectives (From admission, onward)   None       Medication:  . aspirin  300 mg Rectal Daily   Or  . aspirin  325 mg Oral Daily  . clopidogrel  75 mg Oral Daily  . enoxaparin (LOVENOX) injection  40 mg Subcutaneous Q24H  . senna-docusate  1 tablet Oral QHS    acetaminophen **OR** acetaminophen (TYLENOL) oral liquid 160 mg/5 mL **OR** acetaminophen   Objective:   Vitals:   08/14/18 2317 08/15/18 0115 08/15/18 0313 08/15/18 0513  BP: (!) 121/96 (!) 116/94 (!) 139/97 120/83  Pulse: 68 67 67 65  Resp: 15 16 15 16   Temp: 98.7 F (37.1 C) 99 F (37.2 C) 98.3 F (36.8 C) 98.6 F (37 C)  TempSrc: Oral Oral Oral Oral  SpO2: 100% 100% 100% 100%  Weight:      Height:        Intake/Output Summary (Last 24 hours) at 08/15/2018 0735 Last data filed at 08/14/2018 2245 Gross per 24 hour  Intake 0 ml  Output -  Net 0 ml   Filed Weights   08/14/18 1051 08/14/18 2133  Weight: 87.1 kg 91.6 kg     Examination:   Physical Exam  Constitution:  Alert, cooperative, no distress,  Appears calm and comfortable  Psychiatric: Normal and stable mood and affect, cognition intact,  HEENT: Normocephalic, PERRL,Visual loss left side- otherwise with in Normal limits -  Chest:Chest symmetric Cardio vascular:  S1/S2, RRR, No murmure, No Rubs or Gallops  pulmonary: Clear to auscultation bilaterally,  respirations unlabored, negative wheezes / crackles Abdomen: Soft, non-tender, non-distended, bowel sounds,no masses, no organomegaly Muscular skeletal: Limited exam - in bed, able to move all 4 extremities left-sided weakness Neuro: Significant facial asymmetry, visual loss left side, left-sided weakness, upper and lower extremity 3-4 out of 5, gait cannot be assessed at this time Speech intact, thought process intact Extremities: No pitting edema lower extremities, +2 pulses  Skin: Dry, warm to touch, negative for any Rashes, No open wounds Wounds: per nursing documentation  LABs:  CBC Latest Ref Rng & Units 08/14/2018 08/14/2018  WBC 4.0 - 10.5 K/uL - 12.7(H)  Hemoglobin 13.0 - 17.0 g/dL 40.915.3 81.115.4  Hematocrit 91.439.0 - 52.0 % 45.0 43.2  Platelets 150 - 400 K/uL - 185   CMP Latest Ref Rng & Units 08/14/2018 08/14/2018  Glucose 70 - 99 mg/dL 782(N149(H) 562(Z155(H)  BUN 6 - 20 mg/dL 30(Q21(H) 19  Creatinine 6.570.61 - 1.24 mg/dL 8.461.00 9.621.07  Sodium 952135 -  145 mmol/L 143 141  Potassium 3.5 - 5.1 mmol/L 3.7 3.6  Chloride 98 - 111 mmol/L 107 108  CO2 22 - 32 mmol/L - 24  Calcium 8.9 - 10.3 mg/dL - 9.3  Total Protein 6.5 - 8.1 g/dL - 7.2  Total Bilirubin 0.3 - 1.2 mg/dL - 0.7  Alkaline Phos 38 - 126 U/L - 111  AST 15 - 41 U/L - 18  ALT 0 - 44 U/L - 30     SIGNED: Deatra James, MD, FACP, FHM. [facility],  Pager [phone]  If 7PM-7AM, please contact night-coverage [email or web address] Ota [facility] [address] 08/15/2018, 7:35 AM

## 2018-08-15 NOTE — H&P (View-Only) (Signed)
STROKE TEAM PROGRESS NOTE   INTERVAL HISTORY I have personally reviewed history of presenting illness with the patient in details as well as reviewed his prior stroke admission [facility] in [facility].  He was admitted there with cryptogenic stroke with right PCA occlusion and right PCA infarct.  He also subsequently had 30-day external heart monitor which was negative for atrial fibrillation.  He was placed on antiplatelet therapy and did reasonably well with persistent mild left hemiparesis and left-sided peripheral vision loss.  He denies any prior history of deep vein thrombosis, pulmonary embolism, strokes or heart attacks at a young age history in his family.  His father had a stroke at age 41.  Patient admits to smoking marijuana and is willing to quit  Vitals:   08/15/18 0513 08/15/18 0710 08/15/18 0802 08/15/18 0952  BP: 120/83 (!) 122/91 122/86 (!) 142/96  Pulse: 65 78 67 84  Resp: 16 16 18 20   Temp: 98.6 F (37 C) 98 F (36.7 C) 98.8 F (37.1 C) 98.8 F (37.1 C)  TempSrc: Oral Oral Oral Oral  SpO2: 100% 100% 100% 100%  Weight:      Height:        CBC:  Recent Labs  Lab 08/14/18 1106 08/14/18 1123  WBC 12.7*  --   NEUTROABS 10.3*  --   HGB 15.4 15.3  HCT 43.2 45.0  MCV 88.3  --   PLT 185  --     Basic Metabolic Panel:  Recent Labs  Lab 08/14/18 1106 08/14/18 1123  NA 141 143  K 3.6 3.7  CL 108 107  CO2 24  --   GLUCOSE 155* 149*  BUN 19 21*  CREATININE 1.07 1.00  CALCIUM 9.3  --    Lipid Panel: No results found for: CHOL, TRIG, HDL, CHOLHDL, VLDL, LDLCALC HgbA1c:  Lab Results  Component Value Date   HGBA1C 6.2 (H) 08/15/2018   Urine Drug Screen:     Component Value Date/Time   LABOPIA NONE DETECTED 08/14/2018 1133   COCAINSCRNUR NONE DETECTED 08/14/2018 1133   LABBENZ NONE DETECTED 08/14/2018 1133   AMPHETMU NONE DETECTED 08/14/2018 1133   THCU POSITIVE (A) 08/14/2018 1133   LABBARB NONE DETECTED 08/14/2018  1133    Alcohol Level     Component Value Date/Time   ETH <10 08/14/2018 1106    IMAGING Ct Code Stroke Cta Head W/wo Contrast  Result Date: 08/14/2018 CLINICAL DATA:  43 year old male code stroke. Left arm weakness, altered mental status. Clinically, a tip of the basilar occlusion is being question. EXAM: CT ANGIOGRAPHY HEAD AND NECK CT PERFUSION BRAIN TECHNIQUE: Multidetector CT imaging of the head and neck was performed using the standard protocol during bolus administration of intravenous contrast. Multiplanar CT image reconstructions and MIPs were obtained to evaluate the vascular anatomy. Carotid stenosis measurements (when applicable) are obtained utilizing NASCET criteria, using the distal internal carotid diameter as the denominator. Multiphase CT imaging of the brain was performed following IV bolus contrast injection. Subsequent parametric perfusion maps were calculated using RAPID software. CONTRAST:  90mL OMNIPAQUE IOHEXOL 350 MG/ML SOLN COMPARISON:  Plain head CT 1144 hours today. FINDINGS: CT Brain Perfusion Findings: ASPECTS: 10 CBF (<30%) Volume: None Perfusion (Tmax>6.0s) volume: None Mismatch Volume: Not applicable Infarction Location:Not applicable CTA NECK Skeleton: No acute osseous abnormality identified. Scoliosis. Reversal of cervical lordosis. Upper chest: Negative lung apices and superior mediastinum. Other neck: Negative. Aortic arch: 3 vessel arch configuration. No arch atherosclerosis or  great vessel origin stenosis. Right carotid system: Negative. Left carotid system: The left CCA and left ICA are somewhat smaller than the right side, but otherwise negative. The left carotid bifurcation is negative. Vertebral arteries: Normal proximal right subclavian artery and right vertebral artery origin. Normal right vertebral artery to the skull base. Normal proximal left subclavian artery and left vertebral artery origin. The left vertebral artery appears mildly non dominant but  otherwise normal to the skull base. CTA HEAD Posterior circulation: Dominant distal right vertebral artery. Patent PICA origins. No distal vertebral stenosis. Mildly dolichoectatic distal vertebral arteries and vertebrobasilar junction. Dolichoectatic basilar artery (5 millimeters diameter). The basilar tip is mildly tortuous. The right PCA is occluded at its origin (series 12, image 22, with chronic encephalomalacia noted throughout the right PCA territory. Both SCA and the left PCA origins are patent. There is no right PCA enhancement. The left PCA is mildly irregular at its origin and in the P2 segment, but there is distal left PCA enhancement. Anterior circulation: Both ICA siphons are patent. The left appears non dominant, and the left ACA A1 segment is absent. The right A1 is dominant. The left ICA siphon is mildly irregular without significant stenosis. The right ICA siphon is also mildly irregular, but without discrete plaque. No right ICA siphon stenosis. Normal right ICA terminus, right MCA and dominant right A1 origin. The anterior communicating artery and bilateral ACA branches are within normal limits. Left MCA M1 segment and bifurcation are patent without stenosis. Left MCA branches are within normal limits. Right MCA M1 and bifurcation are patent without stenosis. Right MCA branches are within normal limits. Venous sinuses: Early contrast timing, not evaluated. Anatomic variants: Mildly dominant right vertebral artery. Dominant right and absent left ACA A1 segments with associated dominance of the right carotid. Review of the MIP images confirms the above findings IMPRESSION: 1. Negative for emergent large vessel occlusion, and no ischemia or core infarct detected by CT Perfusion. 2. Positive for chronic Right PCA occlusion at its origin, corresponding to chronic right PCA territory encephalomalacia. 3. Ectatic basilar artery. Mild irregularity of both ICA siphons and the Left PCA without significant  stenosis. 4. No carotid or vertebral artery abnormality in the neck. These results were communicated to Dr. Otelia Limes at 12:20 pmon 08/14/2018 by text page via the [facility] messaging system. Electronically Signed   By: Odessa Fleming M.D.   On: 08/14/2018 12:20   Ct Code Stroke Cta Neck W/wo Contrast  Result Date: 08/14/2018 CLINICAL DATA:  43 year old male code stroke. Left arm weakness, altered mental status. Clinically, a tip of the basilar occlusion is being question. EXAM: CT ANGIOGRAPHY HEAD AND NECK CT PERFUSION BRAIN TECHNIQUE: Multidetector CT imaging of the head and neck was performed using the standard protocol during bolus administration of intravenous contrast. Multiplanar CT image reconstructions and MIPs were obtained to evaluate the vascular anatomy. Carotid stenosis measurements (when applicable) are obtained utilizing NASCET criteria, using the distal internal carotid diameter as the denominator. Multiphase CT imaging of the brain was performed following IV bolus contrast injection. Subsequent parametric perfusion maps were calculated using RAPID software. CONTRAST:  90mL OMNIPAQUE IOHEXOL 350 MG/ML SOLN COMPARISON:  Plain head CT 1144 hours today. FINDINGS: CT Brain Perfusion Findings: ASPECTS: 10 CBF (<30%) Volume: None Perfusion (Tmax>6.0s) volume: None Mismatch Volume: Not applicable Infarction Location:Not applicable CTA NECK Skeleton: No acute osseous abnormality identified. Scoliosis. Reversal of cervical lordosis. Upper chest: Negative lung apices and superior mediastinum. Other neck: Negative. Aortic arch: 3 vessel  arch configuration. No arch atherosclerosis or great vessel origin stenosis. Right carotid system: Negative. Left carotid system: The left CCA and left ICA are somewhat smaller than the right side, but otherwise negative. The left carotid bifurcation is negative. Vertebral arteries: Normal proximal right subclavian artery and right vertebral artery origin. Normal right vertebral artery to  the skull base. Normal proximal left subclavian artery and left vertebral artery origin. The left vertebral artery appears mildly non dominant but otherwise normal to the skull base. CTA HEAD Posterior circulation: Dominant distal right vertebral artery. Patent PICA origins. No distal vertebral stenosis. Mildly dolichoectatic distal vertebral arteries and vertebrobasilar junction. Dolichoectatic basilar artery (5 millimeters diameter). The basilar tip is mildly tortuous. The right PCA is occluded at its origin (series 12, image 22, with chronic encephalomalacia noted throughout the right PCA territory. Both SCA and the left PCA origins are patent. There is no right PCA enhancement. The left PCA is mildly irregular at its origin and in the P2 segment, but there is distal left PCA enhancement. Anterior circulation: Both ICA siphons are patent. The left appears non dominant, and the left ACA A1 segment is absent. The right A1 is dominant. The left ICA siphon is mildly irregular without significant stenosis. The right ICA siphon is also mildly irregular, but without discrete plaque. No right ICA siphon stenosis. Normal right ICA terminus, right MCA and dominant right A1 origin. The anterior communicating artery and bilateral ACA branches are within normal limits. Left MCA M1 segment and bifurcation are patent without stenosis. Left MCA branches are within normal limits. Right MCA M1 and bifurcation are patent without stenosis. Right MCA branches are within normal limits. Venous sinuses: Early contrast timing, not evaluated. Anatomic variants: Mildly dominant right vertebral artery. Dominant right and absent left ACA A1 segments with associated dominance of the right carotid. Review of the MIP images confirms the above findings IMPRESSION: 1. Negative for emergent large vessel occlusion, and no ischemia or core infarct detected by CT Perfusion. 2. Positive for chronic Right PCA occlusion at its origin, corresponding to  chronic right PCA territory encephalomalacia. 3. Ectatic basilar artery. Mild irregularity of both ICA siphons and the Left PCA without significant stenosis. 4. No carotid or vertebral artery abnormality in the neck. These results were communicated to Dr. Otelia LimesLindzen at 12:20 pmon 08/14/2018 by text page via the [facility] messaging system. Electronically Signed   By: Odessa FlemingH  Hall M.D.   On: 08/14/2018 12:20   Mr Brain Wo Contrast  Result Date: 08/14/2018 CLINICAL DATA:  Dizziness which began yesterday has increased this morning. Imbalance. Recent stroke February 2020. Difficulty opening RIGHT eye. Recently stopped taking Plavix in preparation for oral surgery. EXAM: MRI HEAD WITHOUT CONTRAST TECHNIQUE: Multiplanar, multiecho pulse sequences of the brain and surrounding structures were obtained without intravenous contrast. COMPARISON:  Code stroke CT and CTA head neck earlier today. FINDINGS: Brain: Pleomorphic contiguous area(s) of restricted diffusion, corresponding low ADC, affect the RIGHT cerebral peduncle, RIGHT paramedian midbrain extending to the aqueduct, medial RIGHT thalamus, as well as posterior limb and genu internal capsule, also on the RIGHT consistent with acute nonhemorrhagic infarction. Large area of encephalomalacia, representing a chronic PCA infarct affects the RIGHT occipital lobe and RIGHT posterior temporal lobe. No other areas of chronic infarction. Normal for age cerebral volume. T2 and FLAIR hyperintensities in the white matter, likely small vessel disease. Hemosiderin deposition in the chronic RIGHT occipital infarct, with additional punctate areas of susceptibility LEFT frontal white matter. Vascular: Flow voids are maintained.  Marked dolichoectasia, likely hypertensive in origin. Skull and upper cervical spine: Normal marrow signal. Sinuses/Orbits: Negative. Other: None. IMPRESSION: Acute nonhemorrhagic RIGHT thalamic, midbrain, and internal capsule infarcts. Chronic RIGHT occipital and posterior  temporal PCA territory infarcts. Mild small vessel disease. Electronically Signed   By: Staci Righter M.D.   On: 08/14/2018 15:59   Ct Code Stroke Cta Cerebral Perfusion W/wo Contrast  Result Date: 08/14/2018 CLINICAL DATA:  43 year old male code stroke. Left arm weakness, altered mental status. Clinically, a tip of the basilar occlusion is being question. EXAM: CT ANGIOGRAPHY HEAD AND NECK CT PERFUSION BRAIN TECHNIQUE: Multidetector CT imaging of the head and neck was performed using the standard protocol during bolus administration of intravenous contrast. Multiplanar CT image reconstructions and MIPs were obtained to evaluate the vascular anatomy. Carotid stenosis measurements (when applicable) are obtained utilizing NASCET criteria, using the distal internal carotid diameter as the denominator. Multiphase CT imaging of the brain was performed following IV bolus contrast injection. Subsequent parametric perfusion maps were calculated using RAPID software. CONTRAST:  75mL OMNIPAQUE IOHEXOL 350 MG/ML SOLN COMPARISON:  Plain head CT 1144 hours today. FINDINGS: CT Brain Perfusion Findings: ASPECTS: 10 CBF (<30%) Volume: None Perfusion (Tmax>6.0s) volume: None Mismatch Volume: Not applicable Infarction Location:Not applicable CTA NECK Skeleton: No acute osseous abnormality identified. Scoliosis. Reversal of cervical lordosis. Upper chest: Negative lung apices and superior mediastinum. Other neck: Negative. Aortic arch: 3 vessel arch configuration. No arch atherosclerosis or great vessel origin stenosis. Right carotid system: Negative. Left carotid system: The left CCA and left ICA are somewhat smaller than the right side, but otherwise negative. The left carotid bifurcation is negative. Vertebral arteries: Normal proximal right subclavian artery and right vertebral artery origin. Normal right vertebral artery to the skull base. Normal proximal left subclavian artery and left vertebral artery origin. The left  vertebral artery appears mildly non dominant but otherwise normal to the skull base. CTA HEAD Posterior circulation: Dominant distal right vertebral artery. Patent PICA origins. No distal vertebral stenosis. Mildly dolichoectatic distal vertebral arteries and vertebrobasilar junction. Dolichoectatic basilar artery (5 millimeters diameter). The basilar tip is mildly tortuous. The right PCA is occluded at its origin (series 12, image 22, with chronic encephalomalacia noted throughout the right PCA territory. Both SCA and the left PCA origins are patent. There is no right PCA enhancement. The left PCA is mildly irregular at its origin and in the P2 segment, but there is distal left PCA enhancement. Anterior circulation: Both ICA siphons are patent. The left appears non dominant, and the left ACA A1 segment is absent. The right A1 is dominant. The left ICA siphon is mildly irregular without significant stenosis. The right ICA siphon is also mildly irregular, but without discrete plaque. No right ICA siphon stenosis. Normal right ICA terminus, right MCA and dominant right A1 origin. The anterior communicating artery and bilateral ACA branches are within normal limits. Left MCA M1 segment and bifurcation are patent without stenosis. Left MCA branches are within normal limits. Right MCA M1 and bifurcation are patent without stenosis. Right MCA branches are within normal limits. Venous sinuses: Early contrast timing, not evaluated. Anatomic variants: Mildly dominant right vertebral artery. Dominant right and absent left ACA A1 segments with associated dominance of the right carotid. Review of the MIP images confirms the above findings IMPRESSION: 1. Negative for emergent large vessel occlusion, and no ischemia or core infarct detected by CT Perfusion. 2. Positive for chronic Right PCA occlusion at its origin, corresponding to chronic right PCA  territory encephalomalacia. 3. Ectatic basilar artery. Mild irregularity of both  ICA siphons and the Left PCA without significant stenosis. 4. No carotid or vertebral artery abnormality in the neck. These results were communicated to Dr. Otelia Limes at 12:20 pmon 08/14/2018 by text page via the [facility] messaging system. Electronically Signed   By: Odessa Fleming M.D.   On: 08/14/2018 12:20   Ct Head Code Stroke Wo Contrast  Result Date: 08/14/2018 CLINICAL DATA:  Code stroke. 43 year old male with left arm weakness. EXAM: CT HEAD WITHOUT CONTRAST TECHNIQUE: Contiguous axial images were obtained from the base of the skull through the vertex without intravenous contrast. COMPARISON:  None available. FINDINGS: Brain: Chronic encephalomalacia in the right PCA territory, including the right thalamus. No cortically based acute infarct identified. Outside of the right PCA territory gray-white matter differentiation appears symmetric and within normal limits. No acute intracranial hemorrhage identified. No midline shift, mass effect, or evidence of intracranial mass lesion. No ventriculomegaly. Vascular: Questionable asymmetric hyperdensity at the right ICA terminus and MCA origin on series 3, image 10. Skull: Negative. Congenital incomplete ossification of the posterior C1 ring. Sinuses/Orbits: Visualized paranasal sinuses and mastoids are stable and well pneumatized. Other: There is some rightward gaze deviation. No acute scalp soft tissue findings. ASPECTS [facility] Stroke Program Early CT Score) Total score (0-10 with 10 being normal): 10 IMPRESSION: 1. Questionable asymmetric hyperdensity at the right ICA terminus and MCA origin, but no acute cortically based infarct or acute intracranial hemorrhage identified. ASPECTS 10. 2. Chronic right PCA territory infarct. 3. These results were communicated to Dr. Otelia Limes at 11:55 amon 8/2/2020by text page via the [facility] messaging system. Electronically Signed   By: Odessa Fleming M.D.   On: 08/14/2018 11:56    PHYSICAL EXAM Pleasant young African-American male not in  distress. . Afebrile. Head is nontraumatic. Neck is supple without bruit.    Cardiac exam no murmur or gallop. Lungs are clear to auscultation. Distal pulses are well felt. Neurological Exam :  Patient is awake alert and interactive.  His eyes are closed due to mechanical ptosis of the right eye and partial drooping of the left eye which he can overcome with forced eye opening.  He has disconjugate eyes with skew eye deviation with left eye deviated down.  He has right gaze paresis in both eyes cannot move to the right and move only partially.  Is able to look to the left past midline but has some restriction of lateral gaze in the left eye.  He has restriction of vertical gaze in both eyes left more than right.  His inferior gaze seems to be better preserved.  He denies diplopia and there is no nystagmus.  He has a dense left homonymous hemianopsia which is old from his previous stroke.  Face is symmetric without weakness Motor system exam shows left hemiparesis with 4/5 strength on the left with significant weakness of left grip intrinsic hand muscles.  Reflexes 2+ symmetric.  Touch pinprick sensation appears intact.  Coordination slightly impaired on the left.  Gait severely ataxic needs 2 person assist to stand and unable to walk and lift his left leg ASSESSMENT/PLAN Mr. Jaxx Huish is a 43 y.o. male with history of HTN, HLD, Vit D deficiency, prediabetic, hx R PCA stroke Feb 2020 with residual deficits of visual impairment and L sided weakness presenting with left sided weakness, gait unsteadiness, worsening somnolence and ocular motility deficit.  Neurological Exam :  Stroke:   R thalamic, midbtrain and internal capsule  infarct likely of cryptogenic etiology in setting of recent embolic right posterior cerebral artery infarct in Feb of - both embolic secondary to unknown source  Code Stroke CT head ? hyperdensity R ICA terminus/MCA origin. No acute abnormality. Old R PCA infarct. ASPECTS 10.      CTA head & neck no ELVO. Chronic R PCA occlusion. estatic BA. Mild irregularity B ICAs.  CT perfusion No acute stroke  MRI  R thalamic, midbrain and internal capsule infarcts. Old R occipital and posterior temporal PCA infarcts. Mild SVD.  TCD w/ bubble negative for PFO  2D Echo pending   TEE to look for embolic source. Arranged with [facility] for tomorrow.  If positive for PFO (patent foramen ovale), check bilateral lower extremity venous dopplers to rule out DVT as possible source of stroke. (I have made patient NPO after midnight tonight).   Recent 30d monitor neg for AF  Check LE dopplers for DVT pending   loop recorder placement pending   LDL pending   HgbA1c 6.2  Lovenox 40 mg sq daily for VTE prophylaxis  aspirin 81 mg daily prior to admission (had stopped taking prescribed plavix from Feb d/t planned dental procedure), now on aspirin 325 mg daily and clopidogrel 75 mg daily following load.   Therapy recommendations:  CIR  Disposition:  pending   Hypertension  Elevated on arrival 152/115  Stable now . Permissive hypertension (OK if < 220/120) but gradually normalize in 5-7 days . Long-term BP goal normotensive  Hyperlipidemia  Home meds:  lipitor 80, resumed in hospital  LDL pending, goal < 70  Continue statin at discharge  PreDiabetes  HgbA1c 6.2  Other Stroke Risk Factors  UDS positive for THC. advised to stop using  Cigarette smoker, advised to stop smoking  ETOH use, advised to drink no more than 2 drink(s) a day  Obesity, Body mass index is 29.82 kg/m., recommend weight loss, diet and exercise as appropriate   Hx stroke/TIA  02/2018 R PCA infarct. D/c on aspirin and plavix. 30d monitor neg for AF.   Family hx stroke (paternal grandmother, paternal uncle)  Other Active Problems  Mild AKI  Hospital day # 1 I have personally obtained history,examined this patient, reviewed notes, independently viewed imaging  studies, participated in medical decision making and plan of care.ROS completed by me personally and pertinent positives fully documented  I have made any additions or clarifications directly to the above note.  He presented with sudden onset of increased left-sided weakness, unsteady gait and worsening somnolence and eye movement abnormalities secondary to large right thalamic and midbrain infarct.  He had a recent right PCA infarct of cryptogenic origin in February 2020.  Recommend further ongoing evaluation by checking transesophageal echocardiogram for cardiac source of embolism and loop recorder for paroxysmal A. fib.  Check antiphospholipid antibodies, sickle cell screen and ANA panel.  Aspirin and Plavix for 3 weeks followed by Plavix alone.  Consider possible participation in the [employer] trial or sleep smart trial if interested. Greater than 50% time during this 35-minute visit was spent on counseling and coordination of care about his recurrent cryptogenic stroke and answering questions and discussing evaluation and treatment plan.  Discussed with Dr. Flossie DibbleShahmehdi. Delia HeadyPramod Sethi, MD Medical Director [facility] Pager: [phone] 08/15/2018 3:04 PM   To contact Stroke Continuity provider, please refer to [email or web address]. After hours, contact General Neurology

## 2018-08-15 NOTE — Progress Notes (Signed)
STROKE TEAM PROGRESS NOTE   INTERVAL HISTORY I have personally reviewed history of presenting illness with the patient in details as well as reviewed his prior stroke admission in care everywhere at [facility] in February 2020.  He was admitted there with cryptogenic stroke with right PCA occlusion and right PCA infarct.  He also subsequently had 30-day external heart monitor which was negative for atrial fibrillation.  He was placed on antiplatelet therapy and did reasonably well with persistent mild left hemiparesis and left-sided peripheral vision loss.  He denies any prior history of deep vein thrombosis, pulmonary embolism, strokes or heart attacks at a young age history in his family.  His father had a stroke at age 57.  Patient admits to smoking marijuana and is willing to quit  Vitals:   08/15/18 0513 08/15/18 0710 08/15/18 0802 08/15/18 0952  BP: 120/83 (!) 122/91 122/86 (!) 142/96  Pulse: 65 78 67 84  Resp: 16 16 18 20  Temp: 98.6 F (37 C) 98 F (36.7 C) 98.8 F (37.1 C) 98.8 F (37.1 C)  TempSrc: Oral Oral Oral Oral  SpO2: 100% 100% 100% 100%  Weight:      Height:        CBC:  Recent Labs  Lab 08/14/18 1106 08/14/18 1123  WBC 12.7*  --   NEUTROABS 10.3*  --   HGB 15.4 15.3  HCT 43.2 45.0  MCV 88.3  --   PLT 185  --     Basic Metabolic Panel:  Recent Labs  Lab 08/14/18 1106 08/14/18 1123  NA 141 143  K 3.6 3.7  CL 108 107  CO2 24  --   GLUCOSE 155* 149*  BUN 19 21*  CREATININE 1.07 1.00  CALCIUM 9.3  --    Lipid Panel: No results found for: CHOL, TRIG, HDL, CHOLHDL, VLDL, LDLCALC HgbA1c:  Lab Results  Component Value Date   HGBA1C 6.2 (H) 08/15/2018   Urine Drug Screen:     Component Value Date/Time   LABOPIA NONE DETECTED 08/14/2018 1133   COCAINSCRNUR NONE DETECTED 08/14/2018 1133   LABBENZ NONE DETECTED 08/14/2018 1133   AMPHETMU NONE DETECTED 08/14/2018 1133   THCU POSITIVE (A) 08/14/2018 1133   LABBARB NONE DETECTED 08/14/2018  1133    Alcohol Level     Component Value Date/Time   ETH <10 08/14/2018 1106    IMAGING Ct Code Stroke Cta Head W/wo Contrast  Result Date: 08/14/2018 CLINICAL DATA:  43-year-old male code stroke. Left arm weakness, altered mental status. Clinically, a tip of the basilar occlusion is being question. EXAM: CT ANGIOGRAPHY HEAD AND NECK CT PERFUSION BRAIN TECHNIQUE: Multidetector CT imaging of the head and neck was performed using the standard protocol during bolus administration of intravenous contrast. Multiplanar CT image reconstructions and MIPs were obtained to evaluate the vascular anatomy. Carotid stenosis measurements (when applicable) are obtained utilizing NASCET criteria, using the distal internal carotid diameter as the denominator. Multiphase CT imaging of the brain was performed following IV bolus contrast injection. Subsequent parametric perfusion maps were calculated using RAPID software. CONTRAST:  90mL OMNIPAQUE IOHEXOL 350 MG/ML SOLN COMPARISON:  Plain head CT 1144 hours today. FINDINGS: CT Brain Perfusion Findings: ASPECTS: 10 CBF (<30%) Volume: None Perfusion (Tmax>6.0s) volume: None Mismatch Volume: Not applicable Infarction Location:Not applicable CTA NECK Skeleton: No acute osseous abnormality identified. Scoliosis. Reversal of cervical lordosis. Upper chest: Negative lung apices and superior mediastinum. Other neck: Negative. Aortic arch: 3 vessel arch configuration. No arch atherosclerosis or   great vessel origin stenosis. Right carotid system: Negative. Left carotid system: The left CCA and left ICA are somewhat smaller than the right side, but otherwise negative. The left carotid bifurcation is negative. Vertebral arteries: Normal proximal right subclavian artery and right vertebral artery origin. Normal right vertebral artery to the skull base. Normal proximal left subclavian artery and left vertebral artery origin. The left vertebral artery appears mildly non dominant but  otherwise normal to the skull base. CTA HEAD Posterior circulation: Dominant distal right vertebral artery. Patent PICA origins. No distal vertebral stenosis. Mildly dolichoectatic distal vertebral arteries and vertebrobasilar junction. Dolichoectatic basilar artery (5 millimeters diameter). The basilar tip is mildly tortuous. The right PCA is occluded at its origin (series 12, image 22, with chronic encephalomalacia noted throughout the right PCA territory. Both SCA and the left PCA origins are patent. There is no right PCA enhancement. The left PCA is mildly irregular at its origin and in the P2 segment, but there is distal left PCA enhancement. Anterior circulation: Both ICA siphons are patent. The left appears non dominant, and the left ACA A1 segment is absent. The right A1 is dominant. The left ICA siphon is mildly irregular without significant stenosis. The right ICA siphon is also mildly irregular, but without discrete plaque. No right ICA siphon stenosis. Normal right ICA terminus, right MCA and dominant right A1 origin. The anterior communicating artery and bilateral ACA branches are within normal limits. Left MCA M1 segment and bifurcation are patent without stenosis. Left MCA branches are within normal limits. Right MCA M1 and bifurcation are patent without stenosis. Right MCA branches are within normal limits. Venous sinuses: Early contrast timing, not evaluated. Anatomic variants: Mildly dominant right vertebral artery. Dominant right and absent left ACA A1 segments with associated dominance of the right carotid. Review of the MIP images confirms the above findings IMPRESSION: 1. Negative for emergent large vessel occlusion, and no ischemia or core infarct detected by CT Perfusion. 2. Positive for chronic Right PCA occlusion at its origin, corresponding to chronic right PCA territory encephalomalacia. 3. Ectatic basilar artery. Mild irregularity of both ICA siphons and the Left PCA without significant  stenosis. 4. No carotid or vertebral artery abnormality in the neck. These results were communicated to Dr. Lindzen at 12:20 pmon 08/14/2018 by text page via the AMION messaging system. Electronically Signed   By: H  Hall M.D.   On: 08/14/2018 12:20   Ct Code Stroke Cta Neck W/wo Contrast  Result Date: 08/14/2018 CLINICAL DATA:  43-year-old male code stroke. Left arm weakness, altered mental status. Clinically, a tip of the basilar occlusion is being question. EXAM: CT ANGIOGRAPHY HEAD AND NECK CT PERFUSION BRAIN TECHNIQUE: Multidetector CT imaging of the head and neck was performed using the standard protocol during bolus administration of intravenous contrast. Multiplanar CT image reconstructions and MIPs were obtained to evaluate the vascular anatomy. Carotid stenosis measurements (when applicable) are obtained utilizing NASCET criteria, using the distal internal carotid diameter as the denominator. Multiphase CT imaging of the brain was performed following IV bolus contrast injection. Subsequent parametric perfusion maps were calculated using RAPID software. CONTRAST:  90mL OMNIPAQUE IOHEXOL 350 MG/ML SOLN COMPARISON:  Plain head CT 1144 hours today. FINDINGS: CT Brain Perfusion Findings: ASPECTS: 10 CBF (<30%) Volume: None Perfusion (Tmax>6.0s) volume: None Mismatch Volume: Not applicable Infarction Location:Not applicable CTA NECK Skeleton: No acute osseous abnormality identified. Scoliosis. Reversal of cervical lordosis. Upper chest: Negative lung apices and superior mediastinum. Other neck: Negative. Aortic arch: 3 vessel   arch configuration. No arch atherosclerosis or great vessel origin stenosis. Right carotid system: Negative. Left carotid system: The left CCA and left ICA are somewhat smaller than the right side, but otherwise negative. The left carotid bifurcation is negative. Vertebral arteries: Normal proximal right subclavian artery and right vertebral artery origin. Normal right vertebral artery to  the skull base. Normal proximal left subclavian artery and left vertebral artery origin. The left vertebral artery appears mildly non dominant but otherwise normal to the skull base. CTA HEAD Posterior circulation: Dominant distal right vertebral artery. Patent PICA origins. No distal vertebral stenosis. Mildly dolichoectatic distal vertebral arteries and vertebrobasilar junction. Dolichoectatic basilar artery (5 millimeters diameter). The basilar tip is mildly tortuous. The right PCA is occluded at its origin (series 12, image 22, with chronic encephalomalacia noted throughout the right PCA territory. Both SCA and the left PCA origins are patent. There is no right PCA enhancement. The left PCA is mildly irregular at its origin and in the P2 segment, but there is distal left PCA enhancement. Anterior circulation: Both ICA siphons are patent. The left appears non dominant, and the left ACA A1 segment is absent. The right A1 is dominant. The left ICA siphon is mildly irregular without significant stenosis. The right ICA siphon is also mildly irregular, but without discrete plaque. No right ICA siphon stenosis. Normal right ICA terminus, right MCA and dominant right A1 origin. The anterior communicating artery and bilateral ACA branches are within normal limits. Left MCA M1 segment and bifurcation are patent without stenosis. Left MCA branches are within normal limits. Right MCA M1 and bifurcation are patent without stenosis. Right MCA branches are within normal limits. Venous sinuses: Early contrast timing, not evaluated. Anatomic variants: Mildly dominant right vertebral artery. Dominant right and absent left ACA A1 segments with associated dominance of the right carotid. Review of the MIP images confirms the above findings IMPRESSION: 1. Negative for emergent large vessel occlusion, and no ischemia or core infarct detected by CT Perfusion. 2. Positive for chronic Right PCA occlusion at its origin, corresponding to  chronic right PCA territory encephalomalacia. 3. Ectatic basilar artery. Mild irregularity of both ICA siphons and the Left PCA without significant stenosis. 4. No carotid or vertebral artery abnormality in the neck. These results were communicated to Dr. Lindzen at 12:20 pmon 08/14/2018 by text page via the AMION messaging system. Electronically Signed   By: H  Hall M.D.   On: 08/14/2018 12:20   Mr Brain Wo Contrast  Result Date: 08/14/2018 CLINICAL DATA:  Dizziness which began yesterday has increased this morning. Imbalance. Recent stroke February 2020. Difficulty opening RIGHT eye. Recently stopped taking Plavix in preparation for oral surgery. EXAM: MRI HEAD WITHOUT CONTRAST TECHNIQUE: Multiplanar, multiecho pulse sequences of the brain and surrounding structures were obtained without intravenous contrast. COMPARISON:  Code stroke CT and CTA head neck earlier today. FINDINGS: Brain: Pleomorphic contiguous area(s) of restricted diffusion, corresponding low ADC, affect the RIGHT cerebral peduncle, RIGHT paramedian midbrain extending to the aqueduct, medial RIGHT thalamus, as well as posterior limb and genu internal capsule, also on the RIGHT consistent with acute nonhemorrhagic infarction. Large area of encephalomalacia, representing a chronic PCA infarct affects the RIGHT occipital lobe and RIGHT posterior temporal lobe. No other areas of chronic infarction. Normal for age cerebral volume. T2 and FLAIR hyperintensities in the white matter, likely small vessel disease. Hemosiderin deposition in the chronic RIGHT occipital infarct, with additional punctate areas of susceptibility LEFT frontal white matter. Vascular: Flow voids are maintained.   Marked dolichoectasia, likely hypertensive in origin. Skull and upper cervical spine: Normal marrow signal. Sinuses/Orbits: Negative. Other: None. IMPRESSION: Acute nonhemorrhagic RIGHT thalamic, midbrain, and internal capsule infarcts. Chronic RIGHT occipital and posterior  temporal PCA territory infarcts. Mild small vessel disease. Electronically Signed   By: John T Curnes M.D.   On: 08/14/2018 15:59   Ct Code Stroke Cta Cerebral Perfusion W/wo Contrast  Result Date: 08/14/2018 CLINICAL DATA:  43-year-old male code stroke. Left arm weakness, altered mental status. Clinically, a tip of the basilar occlusion is being question. EXAM: CT ANGIOGRAPHY HEAD AND NECK CT PERFUSION BRAIN TECHNIQUE: Multidetector CT imaging of the head and neck was performed using the standard protocol during bolus administration of intravenous contrast. Multiplanar CT image reconstructions and MIPs were obtained to evaluate the vascular anatomy. Carotid stenosis measurements (when applicable) are obtained utilizing NASCET criteria, using the distal internal carotid diameter as the denominator. Multiphase CT imaging of the brain was performed following IV bolus contrast injection. Subsequent parametric perfusion maps were calculated using RAPID software. CONTRAST:  90mL OMNIPAQUE IOHEXOL 350 MG/ML SOLN COMPARISON:  Plain head CT 1144 hours today. FINDINGS: CT Brain Perfusion Findings: ASPECTS: 10 CBF (<30%) Volume: None Perfusion (Tmax>6.0s) volume: None Mismatch Volume: Not applicable Infarction Location:Not applicable CTA NECK Skeleton: No acute osseous abnormality identified. Scoliosis. Reversal of cervical lordosis. Upper chest: Negative lung apices and superior mediastinum. Other neck: Negative. Aortic arch: 3 vessel arch configuration. No arch atherosclerosis or great vessel origin stenosis. Right carotid system: Negative. Left carotid system: The left CCA and left ICA are somewhat smaller than the right side, but otherwise negative. The left carotid bifurcation is negative. Vertebral arteries: Normal proximal right subclavian artery and right vertebral artery origin. Normal right vertebral artery to the skull base. Normal proximal left subclavian artery and left vertebral artery origin. The left  vertebral artery appears mildly non dominant but otherwise normal to the skull base. CTA HEAD Posterior circulation: Dominant distal right vertebral artery. Patent PICA origins. No distal vertebral stenosis. Mildly dolichoectatic distal vertebral arteries and vertebrobasilar junction. Dolichoectatic basilar artery (5 millimeters diameter). The basilar tip is mildly tortuous. The right PCA is occluded at its origin (series 12, image 22, with chronic encephalomalacia noted throughout the right PCA territory. Both SCA and the left PCA origins are patent. There is no right PCA enhancement. The left PCA is mildly irregular at its origin and in the P2 segment, but there is distal left PCA enhancement. Anterior circulation: Both ICA siphons are patent. The left appears non dominant, and the left ACA A1 segment is absent. The right A1 is dominant. The left ICA siphon is mildly irregular without significant stenosis. The right ICA siphon is also mildly irregular, but without discrete plaque. No right ICA siphon stenosis. Normal right ICA terminus, right MCA and dominant right A1 origin. The anterior communicating artery and bilateral ACA branches are within normal limits. Left MCA M1 segment and bifurcation are patent without stenosis. Left MCA branches are within normal limits. Right MCA M1 and bifurcation are patent without stenosis. Right MCA branches are within normal limits. Venous sinuses: Early contrast timing, not evaluated. Anatomic variants: Mildly dominant right vertebral artery. Dominant right and absent left ACA A1 segments with associated dominance of the right carotid. Review of the MIP images confirms the above findings IMPRESSION: 1. Negative for emergent large vessel occlusion, and no ischemia or core infarct detected by CT Perfusion. 2. Positive for chronic Right PCA occlusion at its origin, corresponding to chronic right PCA   territory encephalomalacia. 3. Ectatic basilar artery. Mild irregularity of both  ICA siphons and the Left PCA without significant stenosis. 4. No carotid or vertebral artery abnormality in the neck. These results were communicated to Dr. Lindzen at 12:20 pmon 08/14/2018 by text page via the AMION messaging system. Electronically Signed   By: H  Hall M.D.   On: 08/14/2018 12:20   Ct Head Code Stroke Wo Contrast  Result Date: 08/14/2018 CLINICAL DATA:  Code stroke. 43-year-old male with left arm weakness. EXAM: CT HEAD WITHOUT CONTRAST TECHNIQUE: Contiguous axial images were obtained from the base of the skull through the vertex without intravenous contrast. COMPARISON:  None available. FINDINGS: Brain: Chronic encephalomalacia in the right PCA territory, including the right thalamus. No cortically based acute infarct identified. Outside of the right PCA territory gray-white matter differentiation appears symmetric and within normal limits. No acute intracranial hemorrhage identified. No midline shift, mass effect, or evidence of intracranial mass lesion. No ventriculomegaly. Vascular: Questionable asymmetric hyperdensity at the right ICA terminus and MCA origin on series 3, image 10. Skull: Negative. Congenital incomplete ossification of the posterior C1 ring. Sinuses/Orbits: Visualized paranasal sinuses and mastoids are stable and well pneumatized. Other: There is some rightward gaze deviation. No acute scalp soft tissue findings. ASPECTS ([place] Stroke Program Early CT Score) Total score (0-10 with 10 being normal): 10 IMPRESSION: 1. Questionable asymmetric hyperdensity at the right ICA terminus and MCA origin, but no acute cortically based infarct or acute intracranial hemorrhage identified. ASPECTS 10. 2. Chronic right PCA territory infarct. 3. These results were communicated to Dr. Lindzen at 11:55 amon 8/2/2020by text page via the AMION messaging system. Electronically Signed   By: H  Hall M.D.   On: 08/14/2018 11:56    PHYSICAL EXAM Pleasant young African-American male not in  distress. . Afebrile. Head is nontraumatic. Neck is supple without bruit.    Cardiac exam no murmur or gallop. Lungs are clear to auscultation. Distal pulses are well felt. Neurological Exam :  Patient is awake alert and interactive.  His eyes are closed due to mechanical ptosis of the right eye and partial drooping of the left eye which he can overcome with forced eye opening.  He has disconjugate eyes with skew eye deviation with left eye deviated down.  He has right gaze paresis in both eyes cannot move to the right and move only partially.  Is able to look to the left past midline but has some restriction of lateral gaze in the left eye.  He has restriction of vertical gaze in both eyes left more than right.  His inferior gaze seems to be better preserved.  He denies diplopia and there is no nystagmus.  He has a dense left homonymous hemianopsia which is old from his previous stroke.  Face is symmetric without weakness Motor system exam shows left hemiparesis with 4/5 strength on the left with significant weakness of left grip intrinsic hand muscles.  Reflexes 2+ symmetric.  Touch pinprick sensation appears intact.  Coordination slightly impaired on the left.  Gait severely ataxic needs 2 person assist to stand and unable to walk and lift his left leg ASSESSMENT/PLAN Mr. Todd Hood is a 43 y.o. male with history of HTN, HLD, Vit D deficiency, prediabetic, hx R PCA stroke Feb 2020 with residual deficits of visual impairment and L sided weakness presenting with left sided weakness, gait unsteadiness, worsening somnolence and ocular motility deficit.  Neurological Exam :  Stroke:   R thalamic, midbtrain and internal capsule   infarct likely of cryptogenic etiology in setting of recent embolic right posterior cerebral artery infarct in Feb of - both embolic secondary to unknown source  Code Stroke CT head ? hyperdensity R ICA terminus/MCA origin. No acute abnormality. Old R PCA infarct. ASPECTS 10.      CTA head & neck no ELVO. Chronic R PCA occlusion. estatic BA. Mild irregularity B ICAs.  CT perfusion No acute stroke  MRI  R thalamic, midbrain and internal capsule infarcts. Old R occipital and posterior temporal PCA infarcts. Mild SVD.  TCD w/ bubble negative for PFO  2D Echo pending   TEE to look for embolic source. Arranged with [facility] for tomorrow.  If positive for PFO (patent foramen ovale), check bilateral lower extremity venous dopplers to rule out DVT as possible source of stroke. (I have made patient NPO after midnight tonight).   Recent 30d monitor neg for AF  Check LE dopplers for DVT pending   loop recorder placement pending   LDL pending   HgbA1c 6.2  Lovenox 40 mg sq daily for VTE prophylaxis  aspirin 81 mg daily prior to admission (had stopped taking prescribed plavix from Feb d/t planned dental procedure), now on aspirin 325 mg daily and clopidogrel 75 mg daily following load.   Therapy recommendations:  CIR  Disposition:  pending   Hypertension  Elevated on arrival 152/115  Stable now . Permissive hypertension (OK if < 220/120) but gradually normalize in 5-7 days . Long-term BP goal normotensive  Hyperlipidemia  Home meds:  lipitor 80, resumed in hospital  LDL pending, goal < 70  Continue statin at discharge  PreDiabetes  HgbA1c 6.2  Other Stroke Risk Factors  UDS positive for THC. advised to stop using  Cigarette smoker, advised to stop smoking  ETOH use, advised to drink no more than 2 drink(s) a day  Obesity, Body mass index is 29.82 kg/m., recommend weight loss, diet and exercise as appropriate   Hx stroke/TIA  02/2018 R PCA infarct. D/c on aspirin and plavix. 30d monitor neg for AF.   Family hx stroke (paternal grandmother, paternal uncle)  Other Active Problems  Mild AKI  Hospital day # 1 I have personally obtained history,examined this patient, reviewed notes, independently viewed imaging  studies, participated in medical decision making and plan of care.ROS completed by me personally and pertinent positives fully documented  I have made any additions or clarifications directly to the above note.  He presented with sudden onset of increased left-sided weakness, unsteady gait and worsening somnolence and eye movement abnormalities secondary to large right thalamic and midbrain infarct.  He had a recent right PCA infarct of cryptogenic origin in February 2020.  Recommend further ongoing evaluation by checking transesophageal echocardiogram for cardiac source of embolism and loop recorder for paroxysmal A. fib.  Check antiphospholipid antibodies, sickle cell screen and ANA panel.  Aspirin and Plavix for 3 weeks followed by Plavix alone.  Consider possible participation in the [employer] trial or sleep smart trial if interested. Greater than 50% time during this 35-minute visit was spent on counseling and coordination of care about his recurrent cryptogenic stroke and answering questions and discussing evaluation and treatment plan.  Discussed with Dr. Shahmehdi. Viann Nielson, MD Medical Director [facility] Pager: [phone] 08/15/2018 3:04 PM   To contact Stroke Continuity provider, please refer to [email or web address]. After hours, contact General Neurology 

## 2018-08-15 NOTE — Evaluation (Signed)
Occupational Therapy Evaluation Patient Details Name: Todd Hood MRN: [id number] DOB: 04/10/1975 Today's Date: 08/15/2018    History of Present Illness Pt is a 43 y/o male presenting after falling out of a chair while watching tv, with slurred speech and difficulty opening R eye. MRI reveals "Acute nonhemorrhagic RIGHT thalamic, midbrain, and internal capsule infarcts; Chronic RIGHT occipital and posterior temporal PCA territory infarcts.". PMH: CVA, HTN.    Clinical Impression   PTA patient independent with ADLs, mobility.  Admitted for above and limited by problem list below, including impaired balance, L hemiparesis, decreased activity tolerance, impaired vision, L inattention, and decreased cognition (problem solving, safety awareness).  Patient currently requires min assist for grooming/ UB ADLs, mod assist for LB ADLs, and min-mod assist for sit to stand transfers.  Noted L visual impairments from prior CVA, but currently presents with inability to open R eye automatically (has to manually open) and reports unable to read clock--continued assessment. Cueing throughout session for awareness of L side, deficits and safety. Patient will benefit from continued OT services while admitted and after dc at CIR level in order to maximize independence and safety prior to dc home.     Follow Up Recommendations  CIR;Supervision/Assistance - 24 hour    Equipment Recommendations  3 in 1 bedside commode    Recommendations for Other Services Rehab consult;PT consult     Precautions / Restrictions Precautions Precautions: Fall Restrictions Weight Bearing Restrictions: No      Mobility Bed Mobility Overal bed mobility: Needs Assistance Bed Mobility: Supine to Sit;Sit to Supine     Supine to sit: Min assist Sit to supine: Min assist   General bed mobility comments: min assist for mgmt of L LE to/from EOB   Transfers Overall transfer level: Needs assistance   Transfers: Sit to/from  Stand Sit to Stand: Mod assist;Min assist         General transfer comment: min to mod assist standing at EOB with L knee buckling and poor balance    Balance Overall balance assessment: Needs assistance Sitting-balance support: No upper extremity supported;Feet supported Sitting balance-Leahy Scale: Fair Sitting balance - Comments: L lateral lean at times, verbal/tactile cueing to correct Postural control: Left lateral lean Standing balance support: Single extremity supported;During functional activity Standing balance-Leahy Scale: Poor Standing balance comment: relaint on UE and external support                           ADL either performed or assessed with clinical judgement   ADL Overall ADL's : Needs assistance/impaired     Grooming: Minimal assistance;Sitting   Upper Body Bathing: Minimal assistance;Sitting   Lower Body Bathing: Moderate assistance;Sit to/from stand   Upper Body Dressing : Minimal assistance;Sitting   Lower Body Dressing: Sit to/from stand;Moderate assistance               Functional mobility during ADLs: Moderate assistance General ADL Comments: pt limited by cognition, L sided weakness, and impaired balance     Vision Patient Visual Report: Other (comment)(L eye from prior CVA, R eye current CVA) Vision Assessment?: Yes Additional Comments: limited assesment, able to read name tag but not clock with L eye; unable to see when manually opening R eye (ptosis)      Perception Perception Perception Tested?: Yes Perception Deficits: Inattention/neglect Inattention/Neglect: Does not attend to left side of body   Praxis      Pertinent Vitals/Pain Pain Assessment: No/denies pain  Hand Dominance Right   Extremity/Trunk Assessment Upper Extremity Assessment Upper Extremity Assessment: LUE deficits/detail LUE Deficits / Details: grossly 3+/5 MMT, impaired coordination and sensation  LUE Sensation: decreased light  touch;decreased proprioception LUE Coordination: decreased fine motor;decreased gross motor   Lower Extremity Assessment Lower Extremity Assessment: Defer to PT evaluation       Communication Communication Communication: Expressive difficulties(slurred speech)   Cognition Arousal/Alertness: Lethargic(improved with mobility to EOB ) Behavior During Therapy: Flat affect Overall Cognitive Status: Impaired/Different from baseline Area of Impairment: Following commands;Safety/judgement;Awareness;Problem solving                       Following Commands: Follows one step commands consistently;Follows one step commands with increased time Safety/Judgement: Decreased awareness of safety;Decreased awareness of deficits Awareness: Emergent Problem Solving: Slow processing;Decreased initiation;Difficulty sequencing;Requires verbal cues General Comments: pt oriented, following commands given increased time but noted decreased safety awareness and deficits reporting "my L side feels normal"    General Comments  condom cath leaking upon entry, RN removed and encouraged pt to use urinal    Exercises     Shoulder Instructions      Home Living Family/patient expects to be discharged to:: Private residence Living Arrangements: Other relatives;Spouse/significant other(cousin, girlfriend) Available Help at Discharge: Family;Available 24 hours/day Type of Home: Other(Comment)(town house ) Home Access: Stairs to enter Entrance Stairs-Number of Steps: 'im not sure'   Home Layout: Two level;Bed/bath upstairs     Bathroom Shower/Tub: Teacher, early years/pre: Standard     Home Equipment: Environmental consultant - 2 wheels;Bedside commode;Shower seat          Prior Functioning/Environment Level of Independence: Independent        Comments: independent, does not drive, works in Engineer, structural health         OT Problem List: Decreased strength;Decreased activity tolerance;Impaired balance  (sitting and/or standing);Impaired vision/perception;Decreased coordination;Decreased cognition;Decreased safety awareness;Decreased knowledge of use of DME or AE;Decreased knowledge of precautions;Impaired UE functional use;Impaired sensation      OT Treatment/Interventions: Self-care/ADL training;Neuromuscular education;Manual therapy;Therapeutic activities;Cognitive remediation/compensation;Visual/perceptual remediation/compensation;Patient/family education;Balance training;DME and/or AE instruction    OT Goals(Current goals can be found in the care plan section) Acute Rehab OT Goals Patient Stated Goal: to get better OT Goal Formulation: With patient Time For Goal Achievement: 08/29/18 Potential to Achieve Goals: Good  OT Frequency: Min 2X/week   Barriers to D/C:            Co-evaluation              AM-PAC OT "6 Clicks" Daily Activity     Outcome Measure Help from another person eating meals?: A Little Help from another person taking care of personal grooming?: A Little Help from another person toileting, which includes using toliet, bedpan, or urinal?: A Lot Help from another person bathing (including washing, rinsing, drying)?: A Lot Help from another person to put on and taking off regular upper body clothing?: A Little Help from another person to put on and taking off regular lower body clothing?: A Lot 6 Click Score: 15   End of Session Equipment Utilized During Treatment: Gait belt Nurse Communication: Mobility status  Activity Tolerance: Patient tolerated treatment well Patient left: in bed;with call bell/phone within reach;with bed alarm set  OT Visit Diagnosis: Other abnormalities of gait and mobility (R26.89);Muscle weakness (generalized) (M62.81);Other symptoms and signs involving cognitive function;Hemiplegia and hemiparesis Hemiplegia - Right/Left: Left Hemiplegia - dominant/non-dominant: Non-Dominant Hemiplegia - caused by: Cerebral infarction  Time: [id number] OT Time Calculation (min): 35 min Charges:  OT General Charges $OT Visit: 1 Visit OT Evaluation $OT Eval Moderate Complexity: 1 Mod OT Treatments $Self Care/Home Management : 8-22 mins  Chancy Milroyhristie S Tylor Gambrill, OT Acute Rehabilitation Services Pager [phone] Office [phone]   Chancy MilroyChristie S Itzayana Pardy 08/15/2018, 11:35 AM

## 2018-08-15 NOTE — Progress Notes (Signed)
VASCULAR LAB PRELIMINARY  PRELIMINARY  PRELIMINARY  PRELIMINARY  Bilateral lower extremity venous duplex completed.    Preliminary report:  See CV proc for preliminary results.   Todd Hood, RVT 08/15/2018, 4:04 PM

## 2018-08-15 NOTE — Progress Notes (Signed)
PT Cancellation Note  Patient Details Name: Todd Hood MRN: [id number] DOB: 11-03-1975   Cancelled Treatment:    Reason Eval/Treat Not Completed: Fatigue/lethargy limiting ability to participate.  Will reattempt as pt is asleep, likely fatigued from OT evaluation.   Ramond Dial 08/15/2018, 12:12 PM   Mee Hives, PT MS Acute Rehab Dept. Number: [place] [facility] [place]

## 2018-08-15 NOTE — Evaluation (Signed)
Physical Therapy Evaluation Patient Details Name: Todd QuanJoel Jones-Bey MRN: [id number] DOB: 10/29/1975 Today's Date: 08/15/2018   History of Present Illness  Pt is a 43 y/o male presenting after falling out of a chair while watching tv, with slurred speech and difficulty opening R eye. MRI reveals "Acute nonhemorrhagic RIGHT thalamic, midbrain, and internal capsule infarcts; Chronic RIGHT occipital and posterior temporal PCA territory infarcts.". PMH: CVA, HTN.     Clinical Impression  Pt was seen for initial mobility assessment and note his control of L LE is requiring manual help to both avoid buckling and to take a sidestep.  He is best seen with two so he can work on transfers to the chair, to increase standing control and to progress gait with appropriate help to control L knee.  See acutely for these objectives, and will recommend CIR due to his motivation, younger age and prior level of mobility.    Follow Up Recommendations CIR    Equipment Recommendations  None recommended by PT    Recommendations for Other Services Rehab consult     Precautions / Restrictions Precautions Precautions: Fall Precaution Comments: L knee buckles with standing, R eye closed unless manually opened Required Braces or Orthoses: (none ordered but may need KAFO if LLE does not progress) Restrictions Weight Bearing Restrictions: No Other Position/Activity Restrictions: Can see better to L side as R eye is closed      Mobility  Bed Mobility Overal bed mobility: Needs Assistance Bed Mobility: Supine to Sit;Sit to Supine     Supine to sit: Mod assist Sit to supine: Mod assist   General bed mobility comments: mod for lifting up trunk and mod to get legs onto bed  Transfers Overall transfer level: Needs assistance Equipment used: Rolling walker (2 wheeled);1 person hand held assist Transfers: Sit to/from Stand Sit to Stand: Mod assist;From elevated surface         General transfer comment: min to  mod assist standing at EOB with L knee buckling and poor balance  Ambulation/Gait Ambulation/Gait assistance: Mod assist Gait Distance (Feet): 2 Feet Assistive device: Rolling walker (2 wheeled);1 person hand held assist Gait Pattern/deviations: Step-to pattern;Decreased stride length;Decreased weight shift to left;Decreased stance time - left;Wide base of support Gait velocity: reduced Gait velocity interpretation: <1.8 ft/sec, indicate of risk for recurrent falls General Gait Details: assisted pt to sidestep on LLE but is in ER as PT is not having help to attempt this, others in room  Stairs            Wheelchair Mobility    Modified Rankin (Stroke Patients Only)       Balance Overall balance assessment: Needs assistance Sitting-balance support: Feet supported Sitting balance-Leahy Scale: Poor Sitting balance - Comments: L lateral lean most of the time   Standing balance support: Single extremity supported;During functional activity;Bilateral upper extremity supported Standing balance-Leahy Scale: Poor Standing balance comment: used walker for RUE and PT supported L elbow to give pt support to step                             Pertinent Vitals/Pain Pain Assessment: No/denies pain    Home Living Family/patient expects to be discharged to:: Private residence Living Arrangements: Other relatives;Spouse/significant other Available Help at Discharge: Family;Available 24 hours/day Type of Home: Other(Comment) Home Access: Stairs to enter   Entrance Stairs-Number of Steps: (pt is unsure) Home Layout: Two level;Bed/bath upstairs Home Equipment: Walker - 2 wheels;Bedside commode;Shower  seat Additional Comments: information from OT interview    Prior Function Level of Independence: Independent         Comments: was able to walk with no AD     Hand Dominance   Dominant Hand: Right    Extremity/Trunk Assessment   Upper Extremity Assessment Upper  Extremity Assessment: Defer to OT evaluation    Lower Extremity Assessment Lower Extremity Assessment: LLE deficits/detail LLE Deficits / Details: pt has 2+ to 3- LE strength with L knee control and help to sidestep needed for a couple steps LLE Coordination: decreased fine motor;decreased gross motor    Cervical / Trunk Assessment Cervical / Trunk Assessment: Normal  Communication   Communication: Expressive difficulties(slurred speech)  Cognition Arousal/Alertness: Lethargic(but can answer questions ) Behavior During Therapy: Flat affect Overall Cognitive Status: Impaired/Different from baseline Area of Impairment: Following commands;Safety/judgement;Awareness;Problem solving                       Following Commands: Follows one step commands inconsistently;Follows one step commands with increased time Safety/Judgement: Decreased awareness of safety;Decreased awareness of deficits Awareness: Intellectual;Emergent Problem Solving: Slow processing;Decreased initiation;Difficulty sequencing;Requires verbal cues;Requires tactile cues General Comments: pt has no protective ext on LUE, leans laterally with no correction to stop it      General Comments General comments (skin integrity, edema, etc.): pt has a visit from neurologist when PT is part way through the visit.  Observed pt with LLE weakness from prev stroke, now more pronounced    Exercises     Assessment/Plan    PT Assessment Patient needs continued PT services  PT Problem List Decreased strength;Decreased range of motion;Decreased activity tolerance;Decreased balance;Decreased coordination;Decreased mobility;Decreased cognition;Decreased knowledge of use of DME;Decreased safety awareness;Decreased knowledge of precautions;Cardiopulmonary status limiting activity       PT Treatment Interventions DME instruction;Gait training;Functional mobility training;Therapeutic activities;Therapeutic exercise;Balance  training;Neuromuscular re-education;Patient/family education;Stair training    PT Goals (Current goals can be found in the Care Plan section)  Acute Rehab PT Goals Patient Stated Goal: get stronger again PT Goal Formulation: With patient Time For Goal Achievement: 08/29/18 Potential to Achieve Goals: Good    Frequency Min 4X/week   Barriers to discharge Inaccessible home environment;Decreased caregiver support requires two person assist and has stairs to enter house    Co-evaluation               AM-PAC PT "6 Clicks" Mobility  Outcome Measure Help needed turning from your back to your side while in a flat bed without using bedrails?: A Lot Help needed moving from lying on your back to sitting on the side of a flat bed without using bedrails?: A Lot Help needed moving to and from a bed to a chair (including a wheelchair)?: A Lot Help needed standing up from a chair using your arms (e.g., wheelchair or bedside chair)?: A Lot Help needed to walk in hospital room?: Total Help needed climbing 3-5 steps with a railing? : Total 6 Click Score: 10    End of Session Equipment Utilized During Treatment: Gait belt Activity Tolerance: Patient limited by fatigue;Treatment limited secondary to medical complications (Comment) Patient left: in bed;with call bell/phone within reach;with nursing/sitter in room Nurse Communication: Mobility status PT Visit Diagnosis: Unsteadiness on feet (R26.81);Muscle weakness (generalized) (M62.81);Difficulty in walking, not elsewhere classified (R26.2);Hemiplegia and hemiparesis Hemiplegia - Right/Left: Left Hemiplegia - dominant/non-dominant: Non-dominant Hemiplegia - caused by: Cerebral infarction    Time: 0086-7619 PT Time Calculation (min) (ACUTE ONLY): 23 min  Charges:   PT Evaluation $PT Eval Moderate Complexity: 1 Mod PT Treatments $Therapeutic Activity: 8-22 mins       Ivar DrapeRuth E Jaynell Castagnola 08/15/2018, 4:09 PM   Samul Dadauth Dmiya Malphrus, PT MS Acute Rehab  Dept. Number: [facility] [phone] and [facility] [phone]

## 2018-08-16 ENCOUNTER — Inpatient Hospital Stay (HOSPITAL_COMMUNITY): Payer: Medicaid Other

## 2018-08-16 ENCOUNTER — Encounter (HOSPITAL_COMMUNITY): Payer: Self-pay | Admitting: Cardiology

## 2018-08-16 ENCOUNTER — Inpatient Hospital Stay (HOSPITAL_COMMUNITY)
Admit: 2018-08-16 | Discharge: 2018-08-16 | Disposition: A | Discharge status: Home / Self Care | Payer: Medicaid Other | Attending: Student | Admitting: Student

## 2018-08-16 ENCOUNTER — Encounter (HOSPITAL_COMMUNITY)
Admission: EM | Disposition: A | Discharge status: Rehabilitation Facility | Payer: Self-pay | Source: Home / Self Care | Attending: Family Medicine

## 2018-08-16 DIAGNOSIS — R7303 Prediabetes: Secondary | ICD-10-CM

## 2018-08-16 DIAGNOSIS — F1290 Cannabis use, unspecified, uncomplicated: Secondary | ICD-10-CM

## 2018-08-16 HISTORY — PX: TEE WITHOUT CARDIOVERSION: SHX5443

## 2018-08-16 HISTORY — PX: BUBBLE STUDY: SHX6837

## 2018-08-16 LAB — LIPID PANEL
Cholesterol: 136 mg/dL (ref 0–200)
HDL: 30 mg/dL — ABNORMAL LOW (ref >40)
LDL Cholesterol: 84 mg/dL (ref 0–99)
Total CHOL/HDL Ratio: 4.5 RATIO
Triglycerides: 112 mg/dL (ref <150)
VLDL: 22 mg/dL (ref 0–40)

## 2018-08-16 SURGERY — ECHOCARDIOGRAM, TRANSESOPHAGEAL
Anesthesia: Moderate Sedation

## 2018-08-16 MED ORDER — FENTANYL CITRATE (PF) 100 MCG/2ML IJ SOLN
INTRAMUSCULAR | Status: AC
  Filled 2018-08-16: qty 2

## 2018-08-16 MED ORDER — MIDAZOLAM HCL (PF) 10 MG/2ML IJ SOLN
INTRAMUSCULAR | Status: DC | PRN
  Administered 2018-08-16 (×3): 2 mg via INTRAVENOUS

## 2018-08-16 MED ORDER — FENTANYL CITRATE (PF) 100 MCG/2ML IJ SOLN
INTRAMUSCULAR | Status: DC | PRN
  Administered 2018-08-16 (×2): 25 ug via INTRAVENOUS

## 2018-08-16 MED ORDER — MIDAZOLAM HCL (PF) 5 MG/ML IJ SOLN
INTRAMUSCULAR | Status: AC
  Filled 2018-08-16: qty 2

## 2018-08-16 MED ORDER — SODIUM CHLORIDE 0.9 % IV SOLN
Freq: Once | INTRAVENOUS | Status: AC
  Administered 2018-08-16: 15:00:00 via INTRAVENOUS

## 2018-08-16 MED ORDER — SODIUM CHLORIDE 0.9 % IV SOLN
INTRAVENOUS | Status: DC
  Administered 2018-08-16: 10:00:00 via INTRAVENOUS

## 2018-08-16 MED ORDER — BUTAMBEN-TETRACAINE-BENZOCAINE 2-2-14 % EX AERO
INHALATION_SPRAY | CUTANEOUS | Status: DC | PRN
  Administered 2018-08-16: 11:00:00 2 via TOPICAL

## 2018-08-16 MED ORDER — DIPHENHYDRAMINE HCL 50 MG/ML IJ SOLN
INTRAMUSCULAR | Status: AC
  Filled 2018-08-16: qty 1

## 2018-08-16 NOTE — CV Procedure (Signed)
    Transesophageal Echocardiogram Note  Todd Hood [id number] 11-Feb-1975  Procedure: Transesophageal Echocardiogram Indications: CVA  Procedure Details Consent: Obtained Time Out: Verified patient identification, verified procedure, site/side was marked, verified correct patient position, special equipment/implants available, Radiology Safety Procedures followed,  medications/allergies/relevent history reviewed, required imaging and test results available.  Performed  Medications:  During this procedure the patient is administered a total of Versed 6 mg and Fentanyl 50 mcg  to achieve and maintain moderate conscious sedation.  The patient's heart rate, blood pressure, and oxygen saturation are monitored continuously during the procedure. The period of conscious sedation is 30 minutes, of which I was present face-to-face 100% of this time.  Normal LV function; atrial septal aneurysm; negative saline microcavitation study.   Complications: No apparent complications Patient did tolerate procedure well.  Kirk Ruths, MD

## 2018-08-16 NOTE — Progress Notes (Signed)
PROGRESS NOTE    Patient: Todd Hood                            PCP: Patient, No Pcp Per                    DOB: 11/03/1975            DOA: 08/14/2018 [id number]             DOS: 08/16/2018, 1:09 PM   LOS: 2 days   Date of Service: The patient was seen and examined on 08/16/2018  Subjective:   The patient was seen and examined. Patient has been extensively counseled regarding needing TEE as he has been refusing, he has refused loop recorder placement. No further events overnight, no progression of symptoms.  Patient's mother was present at bedside -findings were discussed in detail.  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 Leim FabryLeeann Willis) left-sided weakness-supposed to be on aspirin Plavix-stop taking Plavix 3 weeks ago because of upcoming dental procedure which has not yet been scheduled Significant other give some of the history-tells me was at patient's mother's house 8/1 for dinner seemed a little lightheaded?  Heatstroke-significant other was concerned-(as a CNA) did not sleep well and left for her work at facility-received a call 9:30 AM that patient had fallen out of the chair while watching TV and had difficulty opening the right eye He endorses taking all of his prior meds other than his Plavix as dictated above Significant got other got concerned because of the fall some slurred speech-by report smile is asymmetric Code stroke called 1106  Neurology saw the patient in the ED  Coronavirus screen is Negative   ED Course: Patient given 600 mg rectal aspirin suppository started on Plavix 75 daily swallow screen done in the ED was cleared MRI CT done as below   Assessment & Plan:   Active Problems:   Acute ischemic stroke (HCC)   HTN (hypertension)   Prediabetes   Marijuana use   Acute nonhemorrhagic right sided thalamic internal capsule stroke -Stable, no progression of his symptoms -Remains to have significant right facial droop, right eyelid droop.  Left-sided weakness.  Per patient no progression of his symptoms. -Pending neurology evaluation, suspecting resuming aspirin Plavix -CTA/MRI was reviewed  He has finally agreed to TEE this morning. Continues to refuse loop recorder reporting he has had it done in February with his first stroke according to him was negative he cannot afford it, has no insurance.  -TEE  -F/up labs: A1c; 6.2  Lipid panel,; LDL 84, HDL 30, total cholesterol 956136, triglyceride 112,  Contusion right side head -Stable   Mild AKI -Monitoring closely -Improved with IV fluid -may not use hypertensive agents in the future which can cause azotemia  Visual loss left side -Outpatient follow-up with optometrist already arranged-needs to keep appointments -Neurology to comment on safety regarding driving going forward  HTN -Study elevated - -anticipating resuming amlodipine 10 losartan 50 mg, in AM  Impaired glucose tolerance - Check A1c 6.2 -Prediabetic, diabetic diet recommended -SSI  Substance abuse -mainly marijuana -Patient advised to quit his habits -He denies tobacco abuse and alcohol   Cultures; None  Antimicrobials: None  DVT prophylaxis: SCD/Compression stockings and Lovenox SQ Code Status:   Code Status: Full Code Family Communication: No family member present at bedside- attempt will be made to update daily The above findings and plan of care has been discussed with patientin detail,  they expressed understanding and agreement of above. Disposition Plan: In next 24-hour, if accepted to inpatient rehab, CIR consulted   Admission status:  NPATIEN -  Consultants: Neurology    Imaging  studies:  CTA head:   IMPRESSION: 1. Negative for emergent large vessel occlusion, and no ischemia or core infarct detected by CT Perfusion. 2. Positive for chronic Right PCA occlusion at its origin, corresponding to chronic right PCA territory encephalomalacia. 3. Ectatic basilar artery. Mild irregularity of both ICA siphons and the Left PCA without significant stenosis. 4. No carotid or vertebral artery abnormality in the neck.  MRI head:  IMPRESSION: Acute nonhemorrhagic RIGHT thalamic, midbrain, and internal capsule infarcts.  Chronic RIGHT occipital and posterior temporal PCA territory infarcts.  Mild small vessel disease.   Medical tests:   EKG independently reviewed: Sinus rhythm PR interval 0.08 QRS axis 50-60 no ST-T wave changes    Procedures:    TEE 08/16/2018 IMPRESSIONS    1. The left ventricle has normal systolic function, with an ejection fraction of 55-60%. No evidence of left ventricular regional wall motion abnormalities.  2. The right ventricle has normal systolc function. The cavity was normal.  3. The aortic valve is tricuspid No stenosis of the aortic valve.  4. There is evidence of mild plaque in the descending aorta.  5. The interatrial septum is aneurysmal.  6. Normal LV systolic function; atrial septal aneurysm; negative saline microcavitation study.   Antimicrobials:  Anti-infectives (From admission, onward)   None       Medication:  . aspirin  300 mg Rectal Daily   Or  . aspirin  325 mg Oral Daily  . atorvastatin  80 mg Oral QHS  . clopidogrel  75 mg Oral Daily  . enoxaparin (LOVENOX) injection  40 mg Subcutaneous Q24H  . multivitamin with minerals  1 tablet Oral Q breakfast  . senna-docusate  1 tablet Oral QHS    acetaminophen **OR** acetaminophen (TYLENOL) oral liquid 160 mg/5 mL **OR** acetaminophen   Objective:   Vitals:   08/16/18 1100 08/16/18 1110 08/16/18 1125 08/16/18 1156  BP: (!) 154/95 134/90 (!) 139/102  (!) 146/94  Pulse: 73   68  Resp: 19 (!) 25 (!) 22 18  Temp:  (!) 97.4 F (36.3 C)  98.5 F (36.9 C)  TempSrc:  Temporal  Oral  SpO2: 98% 95% 96% 97%  Weight:      Height:        Intake/Output Summary (Last 24 hours) at 08/16/2018 1309 Last data filed at 08/16/2018 0221 Gross per 24 hour  Intake 1336.19 ml  Output 100 ml  Net 1236.19 ml   Filed Weights   08/14/18 1051 08/14/18 2133  Weight: 87.1 kg 91.6 kg     Examination:   BP (!) 146/94 (BP Location: Left Arm)   Pulse 68   Temp 98.5 F (36.9 C) (Oral)   Resp 18   Ht 5\' 9"  (1.753 m)   Wt 91.6 kg   SpO2 97%   BMI 29.82 kg/m    Physical Exam  Constitution:  Alert, cooperative, no distress,  Psychiatric: Normal and stable mood and affect, cognition intact,   HEENT: Normocephalic, PERRL, otherwise with in Normal limits  Chest:Chest symmetric Cardio vascular:  S1/S2, RRR, No murmure, No Rubs or Gallops  pulmonary: Clear to auscultation bilaterally, respirations unlabored, negative wheezes / crackles Abdomen: Soft, non-tender, non-distended, bowel sounds,no masses, no organomegaly Muscular skeletal: no changes   Limited exam - in bed, able to move all 4 extremities left-sided weakness Neuro: Remained stable, no changes -significant facial asymmetry, visual loss left side, left-sided weakness, upper and lower extremity 3-4 out of 5, gait cannot be assessed at this time Speech intact, thought process intact Extremities: No pitting edema lower extremities, +2 pulses  Skin: Dry, warm to touch, negative for any Rashes, No open wounds   LABs:  CBC Latest Ref Rng & Units 08/14/2018 08/14/2018  WBC 4.0 - 10.5 K/uL - 12.7(H)  Hemoglobin 13.0 - 17.0 g/dL 16.115.3 09.615.4  Hematocrit 04.539.0 - 52.0 % 45.0 43.2  Platelets 150 - 400 K/uL - 185   CMP Latest Ref Rng & Units 08/14/2018 08/14/2018  Glucose 70 - 99 mg/dL 409(W149(H) 119(J155(H)  BUN 6 - 20 mg/dL 47(W21(H) 19  Creatinine 2.950.61 - 1.24 mg/dL 6.211.00 3.081.07  Sodium 657135 - 145 mmol/L 143 141  Potassium  3.5 - 5.1 mmol/L 3.7 3.6  Chloride 98 - 111 mmol/L 107 108  CO2 22 - 32 mmol/L - 24  Calcium 8.9 - 10.3 mg/dL - 9.3  Total Protein 6.5 - 8.1 g/dL - 7.2  Total Bilirubin 0.3 - 1.2 mg/dL - 0.7  Alkaline Phos 38 - 126 U/L - 111  AST 15 - 41 U/L - 18  ALT 0 - 44 U/L - 30     SIGNED: Kendell BaneSeyed A , MD, FACP, FHM. [facility],  Pager [phone]  If 7PM-7AM, please contact night-coverage [email or web address], Password [email or web address] 08/16/2018, 1:09 PM

## 2018-08-16 NOTE — Consult Note (Addendum)
ELECTROPHYSIOLOGY CONSULT NOTE  Patient ID: Karmelo Bass MRN: [id number], DOB/AGE: [birth year]   Admit date: 08/14/2018 Date of Consult: 08/16/2018  Primary Physician: Patient, No Pcp Per Primary Cardiologist: No primary care provider on file.  Primary Electrophysiologist: New to None  Reason for Consultation: Cryptogenic stroke; recommendations regarding Implantable Loop Recorder  History of Present Illness EP has been asked to evaluate [name] for placement of an implantable loop recorder or event monitor for atrial fibrillation by Dr Pearlean Brownie.  The patient was admitted on 08/14/2018 with left sided weakness.  They first developed symptoms while at home.  Imaging demonstrated R thalmic, midbrain, and internal capsule infarct. Pt also had embolic right poseterior cerebral artery in Feb. He stopped his ASA and plavix.  he has undergone workup for stroke including echocardiogram and carotid dopplers.  The patient has been monitored on telemetry which has demonstrated sinus rhythm with ? Atrial tachycardia with clear p waves.  Inpatient stroke work-up is to be completed with a TEE, pending patient consent.  Echocardiogram this admission demonstrated Normal EF @ 55% and no obvious thrombus.  Lab work is reviewed.  Prior to admission, the patient denies chest pain, shortness of breath, dizziness, palpitations, or syncope.  They are recovering from their stroke with plans to go home vs CIR at discharge.  Past Medical History:  Diagnosis Date   CVA (cerebral vascular accident) [facility])    Hypertension      Surgical History: History reviewed. No pertinent surgical history.   Medications Prior to Admission  Medication Sig Dispense Refill Last Dose   acetaminophen (TYLENOL) 650 MG CR tablet Take 650 mg by mouth every 8 (eight) hours as needed for pain.   Past Month at Unknown time   amLODipine (NORVASC) 10 MG tablet Take 10 mg by mouth at bedtime.    08/13/2018 at 2200   aspirin EC 81 MG  tablet Take 81 mg by mouth every morning.    08/13/2018 at 0800   atorvastatin (LIPITOR) 80 MG tablet Take 80 mg by mouth at bedtime.   08/13/2018 at 2200   clopidogrel (PLAVIX) 75 MG tablet Take 75 mg by mouth at bedtime.    08/13/2018 at 2200   gabapentin (NEURONTIN) 300 MG capsule Take 300 mg by mouth 2 (two) times daily. Morning and bedtime   08/13/2018 at pm   losartan (COZAAR) 50 MG tablet Take 50 mg by mouth every morning.    08/13/2018 at am   Multiple Vitamins-Minerals (CENTRUM SILVER 50+MEN) TABS Take 1 tablet by mouth daily with breakfast.   08/13/2018 at am    Inpatient Medications:   aspirin  300 mg Rectal Daily   Or   aspirin  325 mg Oral Daily   atorvastatin  80 mg Oral QHS   clopidogrel  75 mg Oral Daily   enoxaparin (LOVENOX) injection  40 mg Subcutaneous Q24H   multivitamin with minerals  1 tablet Oral Q breakfast   senna-docusate  1 tablet Oral QHS    Allergies: No Known Allergies  Social History   Socioeconomic History   Marital status: Single    Spouse name: Not on file   Number of children: Not on file   Years of education: Not on file   Highest education level: Not on file  Occupational History   Not on file  Social Needs   Financial resource strain: Not on file   Food insecurity    Worry: Not on file    Inability: Not on file  Transportation needs    Medical: Not on file    Non-medical: Not on file  Tobacco Use   Smoking status: Current Every Day Smoker    Packs/day: 0.25   Smokeless tobacco: Never Used  Substance and Sexual Activity   Alcohol use: Yes    Comment: social rare   Drug use: Yes    Types: Marijuana    Comment: last used yesterday   Sexual activity: Not on file  Lifestyle   Physical activity    Days per week: Not on file    Minutes per session: Not on file   Stress: Not on file  Relationships   Social connections    Talks on phone: Not on file    Gets together: Not on file    Attends religious service: Not  on file    Active member of club or organization: Not on file    Attends meetings of clubs or organizations: Not on file    Relationship status: Not on file   Intimate partner violence    Fear of current or ex partner: Not on file    Emotionally abused: Not on file    Physically abused: Not on file    Forced sexual activity: Not on file  Other Topics Concern   Not on file  Social History Narrative   Not on file     History reviewed. No pertinent family history.    Review of Systems: All other systems reviewed and are otherwise negative except as noted above.  Physical Exam: Vitals:   08/15/18 1957 08/15/18 2341 08/16/18 0357 08/16/18 0741  BP: (!) 137/98 (!) 139/110 (!) 150/107 (!) 133/104  Pulse: 61 66 75 76  Resp: 16 17 17    Temp: 98.9 F (37.2 C) 98.4 F (36.9 C) 98.1 F (36.7 C) 98.3 F (36.8 C)  TempSrc: Oral Oral Oral Oral  SpO2: 100% 100% 100% 100%  Weight:      Height:        GEN- The patient is well appearing, alert and oriented x 3 today.   Head- normocephalic, atraumatic Eyes-  Sclera clear, conjunctiva pink Ears- hearing intact Oropharynx- clear Neck- supple Lungs- Clear to ausculation bilaterally, normal work of breathing Heart- Regular rate and rhythm, no murmurs, rubs or gallops  GI- soft, NT, ND, + BS Extremities- no clubbing, cyanosis, or edema. Left sided weakness MS- no significant deformity or atrophy Skin- no rash or lesion Psych- euthymic mood, full affect   Labs:   Lab Results  Component Value Date   WBC 12.7 (H) 08/14/2018   HGB 15.3 08/14/2018   HCT 45.0 08/14/2018   MCV 88.3 08/14/2018   PLT 185 08/14/2018    Recent Labs  Lab 08/14/18 1106 08/14/18 1123  NA 141 143  K 3.6 3.7  CL 108 107  CO2 24  --   BUN 19 21*  CREATININE 1.07 1.00  CALCIUM 9.3  --   PROT 7.2  --   BILITOT 0.7  --   ALKPHOS 111  --   ALT 30  --   AST 18  --   GLUCOSE 155* 149*     Radiology/Studies: Ct Code Stroke Cta Head W/wo  Contrast  Result Date: 08/14/2018 CLINICAL DATA:  43 year old male code stroke. Left arm weakness, altered mental status. Clinically, a tip of the basilar occlusion is being question. EXAM: CT ANGIOGRAPHY HEAD AND NECK CT PERFUSION BRAIN TECHNIQUE: Multidetector CT imaging of the head and neck was performed using the standard  protocol during bolus administration of intravenous contrast. Multiplanar CT image reconstructions and MIPs were obtained to evaluate the vascular anatomy. Carotid stenosis measurements (when applicable) are obtained utilizing NASCET criteria, using the distal internal carotid diameter as the denominator. Multiphase CT imaging of the brain was performed following IV bolus contrast injection. Subsequent parametric perfusion maps were calculated using RAPID software. CONTRAST:  90mL OMNIPAQUE IOHEXOL 350 MG/ML SOLN COMPARISON:  Plain head CT 1144 hours today. FINDINGS: CT Brain Perfusion Findings: ASPECTS: 10 CBF (<30%) Volume: None Perfusion (Tmax>6.0s) volume: None Mismatch Volume: Not applicable Infarction Location:Not applicable CTA NECK Skeleton: No acute osseous abnormality identified. Scoliosis. Reversal of cervical lordosis. Upper chest: Negative lung apices and superior mediastinum. Other neck: Negative. Aortic arch: 3 vessel arch configuration. No arch atherosclerosis or great vessel origin stenosis. Right carotid system: Negative. Left carotid system: The left CCA and left ICA are somewhat smaller than the right side, but otherwise negative. The left carotid bifurcation is negative. Vertebral arteries: Normal proximal right subclavian artery and right vertebral artery origin. Normal right vertebral artery to the skull base. Normal proximal left subclavian artery and left vertebral artery origin. The left vertebral artery appears mildly non dominant but otherwise normal to the skull base. CTA HEAD Posterior circulation: Dominant distal right vertebral artery. Patent PICA origins. No  distal vertebral stenosis. Mildly dolichoectatic distal vertebral arteries and vertebrobasilar junction. Dolichoectatic basilar artery (5 millimeters diameter). The basilar tip is mildly tortuous. The right PCA is occluded at its origin (series 12, image 22, with chronic encephalomalacia noted throughout the right PCA territory. Both SCA and the left PCA origins are patent. There is no right PCA enhancement. The left PCA is mildly irregular at its origin and in the P2 segment, but there is distal left PCA enhancement. Anterior circulation: Both ICA siphons are patent. The left appears non dominant, and the left ACA A1 segment is absent. The right A1 is dominant. The left ICA siphon is mildly irregular without significant stenosis. The right ICA siphon is also mildly irregular, but without discrete plaque. No right ICA siphon stenosis. Normal right ICA terminus, right MCA and dominant right A1 origin. The anterior communicating artery and bilateral ACA branches are within normal limits. Left MCA M1 segment and bifurcation are patent without stenosis. Left MCA branches are within normal limits. Right MCA M1 and bifurcation are patent without stenosis. Right MCA branches are within normal limits. Venous sinuses: Early contrast timing, not evaluated. Anatomic variants: Mildly dominant right vertebral artery. Dominant right and absent left ACA A1 segments with associated dominance of the right carotid. Review of the MIP images confirms the above findings IMPRESSION: 1. Negative for emergent large vessel occlusion, and no ischemia or core infarct detected by CT Perfusion. 2. Positive for chronic Right PCA occlusion at its origin, corresponding to chronic right PCA territory encephalomalacia. 3. Ectatic basilar artery. Mild irregularity of both ICA siphons and the Left PCA without significant stenosis. 4. No carotid or vertebral artery abnormality in the neck. These results were communicated to Dr. Otelia LimesLindzen at 12:20 pmon  08/14/2018 by text page via the [facility] messaging system. Electronically Signed   By: Odessa FlemingH  Hall M.D.   On: 08/14/2018 12:20   Ct Code Stroke Cta Neck W/wo Contrast  Result Date: 08/14/2018 CLINICAL DATA:  43 year old male code stroke. Left arm weakness, altered mental status. Clinically, a tip of the basilar occlusion is being question. EXAM: CT ANGIOGRAPHY HEAD AND NECK CT PERFUSION BRAIN TECHNIQUE: Multidetector CT imaging of the head and  neck was performed using the standard protocol during bolus administration of intravenous contrast. Multiplanar CT image reconstructions and MIPs were obtained to evaluate the vascular anatomy. Carotid stenosis measurements (when applicable) are obtained utilizing NASCET criteria, using the distal internal carotid diameter as the denominator. Multiphase CT imaging of the brain was performed following IV bolus contrast injection. Subsequent parametric perfusion maps were calculated using RAPID software. CONTRAST:  90mL OMNIPAQUE IOHEXOL 350 MG/ML SOLN COMPARISON:  Plain head CT 1144 hours today. FINDINGS: CT Brain Perfusion Findings: ASPECTS: 10 CBF (<30%) Volume: None Perfusion (Tmax>6.0s) volume: None Mismatch Volume: Not applicable Infarction Location:Not applicable CTA NECK Skeleton: No acute osseous abnormality identified. Scoliosis. Reversal of cervical lordosis. Upper chest: Negative lung apices and superior mediastinum. Other neck: Negative. Aortic arch: 3 vessel arch configuration. No arch atherosclerosis or great vessel origin stenosis. Right carotid system: Negative. Left carotid system: The left CCA and left ICA are somewhat smaller than the right side, but otherwise negative. The left carotid bifurcation is negative. Vertebral arteries: Normal proximal right subclavian artery and right vertebral artery origin. Normal right vertebral artery to the skull base. Normal proximal left subclavian artery and left vertebral artery origin. The left vertebral artery appears mildly  non dominant but otherwise normal to the skull base. CTA HEAD Posterior circulation: Dominant distal right vertebral artery. Patent PICA origins. No distal vertebral stenosis. Mildly dolichoectatic distal vertebral arteries and vertebrobasilar junction. Dolichoectatic basilar artery (5 millimeters diameter). The basilar tip is mildly tortuous. The right PCA is occluded at its origin (series 12, image 22, with chronic encephalomalacia noted throughout the right PCA territory. Both SCA and the left PCA origins are patent. There is no right PCA enhancement. The left PCA is mildly irregular at its origin and in the P2 segment, but there is distal left PCA enhancement. Anterior circulation: Both ICA siphons are patent. The left appears non dominant, and the left ACA A1 segment is absent. The right A1 is dominant. The left ICA siphon is mildly irregular without significant stenosis. The right ICA siphon is also mildly irregular, but without discrete plaque. No right ICA siphon stenosis. Normal right ICA terminus, right MCA and dominant right A1 origin. The anterior communicating artery and bilateral ACA branches are within normal limits. Left MCA M1 segment and bifurcation are patent without stenosis. Left MCA branches are within normal limits. Right MCA M1 and bifurcation are patent without stenosis. Right MCA branches are within normal limits. Venous sinuses: Early contrast timing, not evaluated. Anatomic variants: Mildly dominant right vertebral artery. Dominant right and absent left ACA A1 segments with associated dominance of the right carotid. Review of the MIP images confirms the above findings IMPRESSION: 1. Negative for emergent large vessel occlusion, and no ischemia or core infarct detected by CT Perfusion. 2. Positive for chronic Right PCA occlusion at its origin, corresponding to chronic right PCA territory encephalomalacia. 3. Ectatic basilar artery. Mild irregularity of both ICA siphons and the Left PCA  without significant stenosis. 4. No carotid or vertebral artery abnormality in the neck. These results were communicated to Dr. Otelia Limes at 12:20 pmon 08/14/2018 by text page via the [facility] messaging system. Electronically Signed   By: Odessa Fleming M.D.   On: 08/14/2018 12:20   Mr Brain Wo Contrast  Result Date: 08/14/2018 CLINICAL DATA:  Dizziness which began yesterday has increased this morning. Imbalance. Recent stroke February 2020. Difficulty opening RIGHT eye. Recently stopped taking Plavix in preparation for oral surgery. EXAM: MRI HEAD WITHOUT CONTRAST TECHNIQUE: Multiplanar, multiecho  pulse sequences of the brain and surrounding structures were obtained without intravenous contrast. COMPARISON:  Code stroke CT and CTA head neck earlier today. FINDINGS: Brain: Pleomorphic contiguous area(s) of restricted diffusion, corresponding low ADC, affect the RIGHT cerebral peduncle, RIGHT paramedian midbrain extending to the aqueduct, medial RIGHT thalamus, as well as posterior limb and genu internal capsule, also on the RIGHT consistent with acute nonhemorrhagic infarction. Large area of encephalomalacia, representing a chronic PCA infarct affects the RIGHT occipital lobe and RIGHT posterior temporal lobe. No other areas of chronic infarction. Normal for age cerebral volume. T2 and FLAIR hyperintensities in the white matter, likely small vessel disease. Hemosiderin deposition in the chronic RIGHT occipital infarct, with additional punctate areas of susceptibility LEFT frontal white matter. Vascular: Flow voids are maintained. Marked dolichoectasia, likely hypertensive in origin. Skull and upper cervical spine: Normal marrow signal. Sinuses/Orbits: Negative. Other: None. IMPRESSION: Acute nonhemorrhagic RIGHT thalamic, midbrain, and internal capsule infarcts. Chronic RIGHT occipital and posterior temporal PCA territory infarcts. Mild small vessel disease. Electronically Signed   By: Elsie Stain M.D.   On: 08/14/2018 15:59    Ct Code Stroke Cta Cerebral Perfusion W/wo Contrast  Result Date: 08/14/2018 CLINICAL DATA:  43 year old male code stroke. Left arm weakness, altered mental status. Clinically, a tip of the basilar occlusion is being question. EXAM: CT ANGIOGRAPHY HEAD AND NECK CT PERFUSION BRAIN TECHNIQUE: Multidetector CT imaging of the head and neck was performed using the standard protocol during bolus administration of intravenous contrast. Multiplanar CT image reconstructions and MIPs were obtained to evaluate the vascular anatomy. Carotid stenosis measurements (when applicable) are obtained utilizing NASCET criteria, using the distal internal carotid diameter as the denominator. Multiphase CT imaging of the brain was performed following IV bolus contrast injection. Subsequent parametric perfusion maps were calculated using RAPID software. CONTRAST:  90mL OMNIPAQUE IOHEXOL 350 MG/ML SOLN COMPARISON:  Plain head CT 1144 hours today. FINDINGS: CT Brain Perfusion Findings: ASPECTS: 10 CBF (<30%) Volume: None Perfusion (Tmax>6.0s) volume: None Mismatch Volume: Not applicable Infarction Location:Not applicable CTA NECK Skeleton: No acute osseous abnormality identified. Scoliosis. Reversal of cervical lordosis. Upper chest: Negative lung apices and superior mediastinum. Other neck: Negative. Aortic arch: 3 vessel arch configuration. No arch atherosclerosis or great vessel origin stenosis. Right carotid system: Negative. Left carotid system: The left CCA and left ICA are somewhat smaller than the right side, but otherwise negative. The left carotid bifurcation is negative. Vertebral arteries: Normal proximal right subclavian artery and right vertebral artery origin. Normal right vertebral artery to the skull base. Normal proximal left subclavian artery and left vertebral artery origin. The left vertebral artery appears mildly non dominant but otherwise normal to the skull base. CTA HEAD Posterior circulation: Dominant distal  right vertebral artery. Patent PICA origins. No distal vertebral stenosis. Mildly dolichoectatic distal vertebral arteries and vertebrobasilar junction. Dolichoectatic basilar artery (5 millimeters diameter). The basilar tip is mildly tortuous. The right PCA is occluded at its origin (series 12, image 22, with chronic encephalomalacia noted throughout the right PCA territory. Both SCA and the left PCA origins are patent. There is no right PCA enhancement. The left PCA is mildly irregular at its origin and in the P2 segment, but there is distal left PCA enhancement. Anterior circulation: Both ICA siphons are patent. The left appears non dominant, and the left ACA A1 segment is absent. The right A1 is dominant. The left ICA siphon is mildly irregular without significant stenosis. The right ICA siphon is also mildly irregular, but without discrete  plaque. No right ICA siphon stenosis. Normal right ICA terminus, right MCA and dominant right A1 origin. The anterior communicating artery and bilateral ACA branches are within normal limits. Left MCA M1 segment and bifurcation are patent without stenosis. Left MCA branches are within normal limits. Right MCA M1 and bifurcation are patent without stenosis. Right MCA branches are within normal limits. Venous sinuses: Early contrast timing, not evaluated. Anatomic variants: Mildly dominant right vertebral artery. Dominant right and absent left ACA A1 segments with associated dominance of the right carotid. Review of the MIP images confirms the above findings IMPRESSION: 1. Negative for emergent large vessel occlusion, and no ischemia or core infarct detected by CT Perfusion. 2. Positive for chronic Right PCA occlusion at its origin, corresponding to chronic right PCA territory encephalomalacia. 3. Ectatic basilar artery. Mild irregularity of both ICA siphons and the Left PCA without significant stenosis. 4. No carotid or vertebral artery abnormality in the neck. These results  were communicated to Dr. Otelia Limes at 12:20 pmon 08/14/2018 by text page via the [facility] messaging system. Electronically Signed   By: Odessa Fleming M.D.   On: 08/14/2018 12:20   Vas [place] Transcranial Doppler W Bubbles  Result Date: 08/15/2018  Transcranial Doppler with Bubble Indications: Stroke. Performing Technologist: Blanch Media RVS  Examination Guidelines: A complete evaluation includes B-mode imaging, spectral Doppler, color Doppler, and power Doppler as needed of all accessible portions of each vessel. Bilateral testing is considered an integral part of a complete examination. Limited examinations for reoccurring indications may be performed as noted.  Summary:  A vascular evaluation was performed. The left middle cerebral artery was studied. An IV was inserted into the patient's right hand. Verbal informed consent was obtained.  No HITS heard during rest. No HITS heard during valsalva. *See table(s) above for measurements and observations.    Preliminary    Ct Head Code Stroke Wo Contrast  Result Date: 08/14/2018 CLINICAL DATA:  Code stroke. 43 year old male with left arm weakness. EXAM: CT HEAD WITHOUT CONTRAST TECHNIQUE: Contiguous axial images were obtained from the base of the skull through the vertex without intravenous contrast. COMPARISON:  None available. FINDINGS: Brain: Chronic encephalomalacia in the right PCA territory, including the right thalamus. No cortically based acute infarct identified. Outside of the right PCA territory gray-white matter differentiation appears symmetric and within normal limits. No acute intracranial hemorrhage identified. No midline shift, mass effect, or evidence of intracranial mass lesion. No ventriculomegaly. Vascular: Questionable asymmetric hyperdensity at the right ICA terminus and MCA origin on series 3, image 10. Skull: Negative. Congenital incomplete ossification of the posterior C1 ring. Sinuses/Orbits: Visualized paranasal sinuses and mastoids are stable and well  pneumatized. Other: There is some rightward gaze deviation. No acute scalp soft tissue findings. ASPECTS [facility] Stroke Program Early CT Score) Total score (0-10 with 10 being normal): 10 IMPRESSION: 1. Questionable asymmetric hyperdensity at the right ICA terminus and MCA origin, but no acute cortically based infarct or acute intracranial hemorrhage identified. ASPECTS 10. 2. Chronic right PCA territory infarct. 3. These results were communicated to Dr. Otelia Limes at 11:55 amon 8/2/2020by text page via the [facility] messaging system. Electronically Signed   By: Odessa Fleming M.D.   On: 08/14/2018 11:56   Vas [place] Lower Extremity Venous (dvt)  Result Date: 08/15/2018  Lower Venous Study Indications: Stroke.  Comparison Study: No prior study on file for comparison. Performing Technologist: Sherren Kerns RVS  Examination Guidelines: A complete evaluation includes B-mode imaging, spectral Doppler, color Doppler, and power  Doppler as needed of all accessible portions of each vessel. Bilateral testing is considered an integral part of a complete examination. Limited examinations for reoccurring indications may be performed as noted.  +---------+---------------+---------+-----------+----------+-------+  RIGHT     Compressibility Phasicity Spontaneity Properties Summary  +---------+---------------+---------+-----------+----------+-------+  CFV       Full            Yes       Yes                             +---------+---------------+---------+-----------+----------+-------+  SFJ       Full                                                      +---------+---------------+---------+-----------+----------+-------+  FV Prox   Full                                                      +---------+---------------+---------+-----------+----------+-------+  FV Mid    Full                                                      +---------+---------------+---------+-----------+----------+-------+  FV Distal Full                                                       +---------+---------------+---------+-----------+----------+-------+  PFV       Full                                                      +---------+---------------+---------+-----------+----------+-------+  POP       Full            Yes       Yes                             +---------+---------------+---------+-----------+----------+-------+  PTV       Full                                                      +---------+---------------+---------+-----------+----------+-------+  PERO      Full                                                      +---------+---------------+---------+-----------+----------+-------+   +---------+---------------+---------+-----------+----------+-------+  LEFT  Compressibility Phasicity Spontaneity Properties Summary  +---------+---------------+---------+-----------+----------+-------+  CFV       Full            Yes       Yes                             +---------+---------------+---------+-----------+----------+-------+  SFJ       Full                                                      +---------+---------------+---------+-----------+----------+-------+  FV Prox   Full                                                      +---------+---------------+---------+-----------+----------+-------+  FV Mid    Full                                                      +---------+---------------+---------+-----------+----------+-------+  FV Distal Full                                                      +---------+---------------+---------+-----------+----------+-------+  PFV       Full                                                      +---------+---------------+---------+-----------+----------+-------+  POP       Full            Yes       Yes                             +---------+---------------+---------+-----------+----------+-------+  PTV       Full                                                      +---------+---------------+---------+-----------+----------+-------+   PERO      Full                                                      +---------+---------------+---------+-----------+----------+-------+     Summary: Right: There is no evidence of deep vein thrombosis in the lower extremity. Left: There is no evidence of deep vein thrombisis in the lower extremity.  *See table(s) above for measurements and observations.    Preliminary     12-lead  ECG NSR 69 bpm (personally reviewed) All prior EKG's in EPIC reviewed with no documented atrial fibrillation  Telemetry NSR in 60-70s mostly, appears to have occasional ? AT with clear p waves (personally reviewed)  Assessment and Plan:  1. Cryptogenic stroke The patient presents with cryptogenic stroke.  The patient has a TEE tenatively planned for this AM if he consents.  I spoke at length with the patient about monitoring for afib with an event monitor. Pt is not a good candidate for a loop recorder with no insurance and a monthly fee.  Risks, benefits, and alteratives to an event monitor were discussed with the patient today. Pt refuses loop recorder consideration.  He states he had wore an 30 day event monitor through [facility] after his stroke in February which was negative. Report not available.   Discussed with Dr. Ladona Ridgelaylor. With recent negative (per patient) event monitor, would not repeat at his time.  Pt can follow up as needed with cards. Stress importance of regular PCP follow up.      Pt made aware and has no further questions.  Graciella FreerMichael Andrew Tillery, PA-C 08/16/2018 8:00 AM   EP Attending  Agree with above. No indication for another event monitor. We will be available if patient changes his mind regarding insertion of an ILR.  Leonia ReevesGregg Jhonathan Desroches,M.D.

## 2018-08-16 NOTE — Progress Notes (Signed)
  Echocardiogram Echocardiogram Transesophageal has been performed.  Burnett Kanaris 08/16/2018, 11:28 AM

## 2018-08-16 NOTE — Progress Notes (Signed)
Pt refuses LOOP due to invasiveness and cost. He has medicaid pending. He states he had event monitor placed after his stroke in February, which was negative to his knowledge.  Will discuss with Dr. Lovena Le if warrants second event monitor or continued outpatient follow up.      Full note vs addendum to follow.

## 2018-08-16 NOTE — Progress Notes (Signed)
Inpatient Rehab Admissions:  Inpatient Rehab Consult received.  I met with pt at the bedside for rehabilitation assessment and to discuss goals and expectations of an inpatient rehab admission.  Pt interested in CIR but would like to speak to financial counselor and his mother prior to making decision. Pt is currently uninsured. AC has placed call to [facility] to request assistance with this case. AC will follow up tomorrow. AC will attempt to reach pt's family to confirm caregiver support at DC.   Jhonnie Garner, OTR/L  Rehab Admissions Coordinator  [phone] 08/16/2018 3:14 PM

## 2018-08-16 NOTE — Progress Notes (Signed)
PT Cancellation Note  Patient Details Name: Todd Hood MRN: [id number] DOB: Nov 02, 1975   Cancelled Treatment:    Reason Eval/Treat Not Completed: Patient at procedure or test/unavailable Second attempt to see pt today. Pt off unit for CT. PT will continue to follow acutely.    Earney Navy, PTA Acute Rehabilitation Services Pager: [phone] Office: [phone]   08/16/2018, 3:59 PM

## 2018-08-16 NOTE — Progress Notes (Signed)
STROKE TEAM PROGRESS NOTE   INTERVAL HISTORY Patient has just returned from TEE and is sleepy and sedated postprocedure.  TEE was negative for PFO or clot but he does have atrial septal aneurysm.  Patient did sign consent for participation in the sleep SMART study and had overnight [place] 3 monitor but tested negative for sleep apnea.  His mother is at the bedside.  Vitals:   08/16/18 1100 08/16/18 1110 08/16/18 1125 08/16/18 1156  BP: (!) 154/95 134/90 (!) 139/102 (!) 146/94  Pulse: 73   68  Resp: 19 (!) 25 (!) 22 18  Temp:  (!) 97.4 F (36.3 C)  98.5 F (36.9 C)  TempSrc:  Temporal  Oral  SpO2: 98% 95% 96% 97%  Weight:      Height:        CBC:  Recent Labs  Lab 08/14/18 1106 08/14/18 1123  WBC 12.7*  --   NEUTROABS 10.3*  --   HGB 15.4 15.3  HCT 43.2 45.0  MCV 88.3  --   PLT 185  --     Basic Metabolic Panel:  Recent Labs  Lab 08/14/18 1106 08/14/18 1123  NA 141 143  K 3.6 3.7  CL 108 107  CO2 24  --   GLUCOSE 155* 149*  BUN 19 21*  CREATININE 1.07 1.00  CALCIUM 9.3  --    Lipid Panel:     Component Value Date/Time   CHOL 136 08/16/2018 0423   TRIG 112 08/16/2018 0423   HDL 30 (L) 08/16/2018 0423   CHOLHDL 4.5 08/16/2018 0423   VLDL 22 08/16/2018 0423   LDLCALC 84 08/16/2018 0423   HgbA1c:  Lab Results  Component Value Date   HGBA1C 6.2 (H) 08/15/2018   Urine Drug Screen:     Component Value Date/Time   LABOPIA NONE DETECTED 08/14/2018 1133   COCAINSCRNUR NONE DETECTED 08/14/2018 1133   LABBENZ NONE DETECTED 08/14/2018 1133   AMPHETMU NONE DETECTED 08/14/2018 1133   THCU POSITIVE (A) 08/14/2018 1133   LABBARB NONE DETECTED 08/14/2018 1133    Alcohol Level     Component Value Date/Time   ETH <10 08/14/2018 1106    IMAGING Mr Brain Wo Contrast  Result Date: 08/14/2018 CLINICAL DATA:  Dizziness which began yesterday has increased this morning. Imbalance. Recent stroke February 2020. Difficulty opening RIGHT eye. Recently stopped taking  Plavix in preparation for oral surgery. EXAM: MRI HEAD WITHOUT CONTRAST TECHNIQUE: Multiplanar, multiecho pulse sequences of the brain and surrounding structures were obtained without intravenous contrast. COMPARISON:  Code stroke CT and CTA head neck earlier today. FINDINGS: Brain: Pleomorphic contiguous area(s) of restricted diffusion, corresponding low ADC, affect the RIGHT cerebral peduncle, RIGHT paramedian midbrain extending to the aqueduct, medial RIGHT thalamus, as well as posterior limb and genu internal capsule, also on the RIGHT consistent with acute nonhemorrhagic infarction. Large area of encephalomalacia, representing a chronic PCA infarct affects the RIGHT occipital lobe and RIGHT posterior temporal lobe. No other areas of chronic infarction. Normal for age cerebral volume. T2 and FLAIR hyperintensities in the white matter, likely small vessel disease. Hemosiderin deposition in the chronic RIGHT occipital infarct, with additional punctate areas of susceptibility LEFT frontal white matter. Vascular: Flow voids are maintained. Marked dolichoectasia, likely hypertensive in origin. Skull and upper cervical spine: Normal marrow signal. Sinuses/Orbits: Negative. Other: None. IMPRESSION: Acute nonhemorrhagic RIGHT thalamic, midbrain, and internal capsule infarcts. Chronic RIGHT occipital and posterior temporal PCA territory infarcts. Mild small vessel disease. Electronically Signed   By: Jonny RuizJohn  Abelino Derrick Curnes M.D.   On: 08/14/2018 15:59   Vas [place]as Transcranial Doppler W Bubbles  Result Date: 08/16/2018  Transcranial Doppler with Bubble Indications: Stroke. Performing Technologist: Blanch MediaMegan Riddle RVS  Examination Guidelines: A complete evaluation includes B-mode imaging, spectral Doppler, color Doppler, and power Doppler as needed of all accessible portions of each vessel. Bilateral testing is considered an integral part of a complete examination. Limited examinations for reoccurring indications may be performed as  noted.  Summary:  A vascular evaluation was performed. The left middle cerebral artery was studied. An IV was inserted into the patient's right hand. Verbal informed consent was obtained.  No HITS heard during rest. No HITS heard during valsalva. Negative TCD Bubble study *See table(s) above for measurements and observations.  Diagnosing physician: Delia HeadyPramod Karlos Scadden MD Electronically signed by Delia HeadyPramod Lenox Bink MD on 08/16/2018 at 1:11:31 PM.    Final    Vas Koreas Lower Extremity Venous (dvt)  Result Date: 08/15/2018  Lower Venous Study Indications: Stroke.  Comparison Study: No prior study on file for comparison. Performing Technologist: Sherren Kernsandace Kanady RVS  Examination Guidelines: A complete evaluation includes B-mode imaging, spectral Doppler, color Doppler, and power Doppler as needed of all accessible portions of each vessel. Bilateral testing is considered an integral part of a complete examination. Limited examinations for reoccurring indications may be performed as noted.  +---------+---------------+---------+-----------+----------+-------+ RIGHT    CompressibilityPhasicitySpontaneityPropertiesSummary +---------+---------------+---------+-----------+----------+-------+ CFV      Full           Yes      Yes                          +---------+---------------+---------+-----------+----------+-------+ SFJ      Full                                                 +---------+---------------+---------+-----------+----------+-------+ FV Prox  Full                                                 +---------+---------------+---------+-----------+----------+-------+ FV Mid   Full                                                 +---------+---------------+---------+-----------+----------+-------+ FV DistalFull                                                 +---------+---------------+---------+-----------+----------+-------+ PFV      Full                                                  +---------+---------------+---------+-----------+----------+-------+ POP      Full           Yes      Yes                          +---------+---------------+---------+-----------+----------+-------+  PTV      Full                                                 +---------+---------------+---------+-----------+----------+-------+ PERO     Full                                                 +---------+---------------+---------+-----------+----------+-------+   +---------+---------------+---------+-----------+----------+-------+ LEFT     CompressibilityPhasicitySpontaneityPropertiesSummary +---------+---------------+---------+-----------+----------+-------+ CFV      Full           Yes      Yes                          +---------+---------------+---------+-----------+----------+-------+ SFJ      Full                                                 +---------+---------------+---------+-----------+----------+-------+ FV Prox  Full                                                 +---------+---------------+---------+-----------+----------+-------+ FV Mid   Full                                                 +---------+---------------+---------+-----------+----------+-------+ FV DistalFull                                                 +---------+---------------+---------+-----------+----------+-------+ PFV      Full                                                 +---------+---------------+---------+-----------+----------+-------+ POP      Full           Yes      Yes                          +---------+---------------+---------+-----------+----------+-------+ PTV      Full                                                 +---------+---------------+---------+-----------+----------+-------+ PERO     Full                                                 +---------+---------------+---------+-----------+----------+-------+  Summary:  Right: There is no evidence of deep vein thrombosis in the lower extremity. Left: There is no evidence of deep vein thrombisis in the lower extremity.  *See table(s) above for measurements and observations.    Preliminary     PHYSICAL EXAM Pleasant young African-American male not in distress. . Afebrile. Head is nontraumatic. Neck is supple without bruit.    Cardiac exam no murmur or gallop. Lungs are clear to auscultation. Distal pulses are well felt. Neurological Exam :  Patient is is sleeping and resting post procedure.  His eyes are closed due to mechanical ptosis of the right eye and partial drooping of the left eye which he can overcome with forced eye opening.  He has disconjugate eyes with skew eye deviation with left eye deviated down.  He has right gaze paresis in both eyes cannot move to the right and move only partially.  Is able to look to the left past midline but has some restriction of lateral gaze in the left eye.  He has restriction of vertical gaze in both eyes left more than right.  His inferior gaze seems to be better preserved.  He denies diplopia and there is no nystagmus.  He has a dense left homonymous hemianopsia which is old from his previous stroke.  Face is symmetric without weakness Motor system exam shows left hemiparesis with 4/5 strength on the left with significant weakness of left grip intrinsic hand muscles.  Reflexes 2+ symmetric.  Touch pinprick sensation appears intact.  Coordination slightly impaired on the left.  Gait severely ataxic needs 2 person assist to stand and unable to walk and lift his left leg ASSESSMENT/PLAN Todd Hood is a 43 y.o. male with history of HTN, HLD, Vit D deficiency, prediabetic, hx R PCA stroke Feb 2020 with residual deficits of visual impairment and L sided weakness presenting with left sided weakness, gait unsteadiness, worsening somnolence and ocular motility deficit.  Neurological Exam :  Stroke:   R thalamic, midbtrain and  internal capsule infarct likely of cryptogenic etiology in setting of recent embolic right posterior cerebral artery infarct in Feb of - both embolic secondary to unknown source  Code Stroke CT head ? hyperdensity R ICA terminus/MCA origin. No acute abnormality. Old R PCA infarct. ASPECTS 10.     CTA head & neck no ELVO. Chronic R PCA occlusion. estatic BA. Mild irregularity B ICAs.  CT perfusion No acute stroke  MRI  R thalamic, midbrain and internal capsule infarcts. Old R occipital and posterior temporal PCA infarcts. Mild SVD.  TCD w/ bubble negative for PFO  2D Echo normal  TEE to look for embolic source. Arranged with [facility] for tomorrow.  If positive for PFO (patent foramen ovale), check bilateral lower extremity venous dopplers to rule out DVT as possible source of stroke. (I have made patient NPO after midnight tonight).   Recent 30d monitor neg for AF  Check LE dopplers for DVT no DVT  loop recorder placement pending   LDL 84 mg %  HgbA1c 6.2  Lovenox 40 mg sq daily for VTE prophylaxis  aspirin 81 mg daily prior to admission (had stopped taking prescribed plavix from Feb d/t planned dental procedure), now on aspirin 325 mg daily and clopidogrel 75 mg daily following load.   Therapy recommendations:  CIR  Disposition:  pending   Hypertension  Elevated on arrival 152/115  Stable now . Permissive hypertension (OK if < 220/120) but gradually normalize in 5-7  days . Long-term BP goal normotensive  Hyperlipidemia  Home meds:  lipitor 80, resumed in hospital  LDL pending, goal < 70  Continue statin at discharge  PreDiabetes  HgbA1c 6.2  Other Stroke Risk Factors  UDS positive for THC. advised to stop using  Cigarette smoker, advised to stop smoking  ETOH use, advised to drink no more than 2 drink(s) a day  Obesity, Body mass index is 29.82 kg/m., recommend weight loss, diet and exercise as appropriate   Hx  stroke/TIA  02/2018 R PCA infarct. D/c on aspirin and plavix. 30d monitor neg for AF.   Family hx stroke (paternal grandmother, paternal uncle)  Other Active Problems  Mild AKI  Hospital day # 2   He presented with sudden onset of increased left-sided weakness, unsteady gait and worsening somnolence and eye movement abnormalities secondary to large right thalamic and midbrain infarct.  He had a recent right PCA infarct of cryptogenic origin in February 2020.  Recommend  loop recorder for paroxysmal A. fib.  Check antiphospholipid antibodies, sickle cell screen and ANA panel.  Aspirin and Plavix for 3 weeks followed by Plavix alone.  Patient was a screen failure in  sleep smart trial if interested. Greater than 50% time during this 25-minute visit was spent on counseling and coordination of care about his recurrent cryptogenic stroke and answering questions and discussing evaluation and treatment plan.  Discussed with Dr. Roger Shelter. And patient`s mother  Antony Contras, MD Medical Director [place] Pager: [phone] 08/16/2018 1:24 PM   To contact Stroke Continuity provider, please refer to [email or web address] After hours, contact General Neurology

## 2018-08-16 NOTE — Progress Notes (Signed)
  Speech Language Pathology Treatment: Cognitive-Linquistic  Patient Details Name: Quitman Norberto MRN: [id number] DOB: [birth year] Today's Date: 08/16/2018 Time: 7741-4239 SLP Time Calculation (min) (ACUTE ONLY): 18 min  Assessment / Plan / Recommendation Clinical Impression  Pt was seen for cognitive-linguistic treatment but the session was abbreviated due to the arrival of dinner. Pt was cooperative throughout the session and verbalized understanding regarding the need to target his cognitive-linguistic deficits if his goal is to return to work. He required mod cues during memory tasks and min. support was needed for complex reasoning. He achieved 67% accuracy with problem solving related to safety and 60% accuracy with time management problems increasing to 100% with min-mod cues. SLP will continue to follow pt.    HPI HPI: Pt is a 43 year old with history of hypertension Impaired glucose tolerance, and daily marijuana use who presented to the ED secondary to weakness and fall. MRI of the brain revealed acute nonhemorrhagic right thalamic, midbrain, and internal capsule infarcts.      SLP Plan  Continue with current plan of care  Patient needs continued Speech Lanaguage Pathology Services    Recommendations                   Follow up Recommendations: Inpatient Rehab SLP Visit Diagnosis: Cognitive communication deficit (R32.023) Plan: Continue with current plan of care       Kasey Ewings I. Hardin Negus, [place], [place] Office number [phone] Pager [phone]                Horton Marshall 08/16/2018, 5:32 PM

## 2018-08-16 NOTE — Progress Notes (Signed)
SLP Cancellation Note  Patient Details Name: Todd Hood MRN: [id number] DOB: 02-18-1975   Cancelled treatment:       Reason Eval/Treat Not Completed: Patient at procedure or test/unavailable(SLP attempted SL twice. Pt was initially with CIR's AC and subsequently with MD. SLP will follow up as able.)  Todd Hood I. Hardin Negus, [place], [place] Office number [phone] Pager [phone]  Todd Hood 08/16/2018, 2:16 PM

## 2018-08-16 NOTE — Evaluation (Signed)
Speech Language Pathology Evaluation Patient Details Name: Shireen QuanJoel Jones-Bey MRN: [id number] DOB: 03/03/1975 Today's Date: 08/16/2018 Time: [id number] SLP Time Calculation (min) (ACUTE ONLY): 17 min  Problem List:  Patient Active Problem List   Diagnosis Date Noted  . HTN (hypertension) 08/15/2018  . Prediabetes 08/15/2018  . Marijuana use 08/15/2018  . Acute ischemic stroke (HCC) 08/14/2018   Past Medical History:  Past Medical History:  Diagnosis Date  . CVA (cerebral vascular accident) (HCC)   . Hypertension    Past Surgical History: History reviewed. No pertinent surgical history. HPI:  Pt is a 43 year old with history of hypertension Impaired glucose tolerance, and daily marijuana use who presented to the ED secondary to weakness and fall. MRI of the brain revealed acute nonhemorrhagic right thalamic, midbrain, and internal capsule infarcts.   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  He denied any baseline deficits in speech or cognition but stated that he had occasional word retrieval difficulty since a prior CVA. Pt and his significant other who was present both denied any new changes related to speech, language or cognition. [facility] was completed to evaluate the pt's cognitive-linguistic skills. He achieved a score of 23/30 which is below the normal limits of 26 or more out of 30 and is suggestive of a mild impairment. He demonstrated deficits in the areas executive function, awareness, complex problem solving, divergent naming, abstract reasoning, and delayed recall. Skilled SLP services are clinically indicated at this time to improve cognition. Pt, his significant other, and nursing were educated regarding results and recommendations; all parties verbalized understanding as well as agreement with plan of care.    SLP Assessment  SLP  Recommendation/Assessment: Patient needs continued Speech Lanaguage Pathology Services SLP Visit Diagnosis: Cognitive communication deficit (R41.841)    Follow Up Recommendations  Inpatient Rehab    Frequency and Duration min 2x/week  2 weeks      SLP Evaluation Cognition  Overall Cognitive Status: Impaired/Different from baseline Arousal/Alertness: Awake/alert Orientation Level: Oriented X4 Attention: Focused;Sustained Focused Attention: Appears intact(Vigialnce WNL: 1/1) Sustained Attention: Appears intact(Serial 7s: 3/3) Memory: Impaired Memory Impairment: Storage deficit;Retrieval deficit;Decreased recall of new information(Immediate: 5/5; delayed: 4/5 with cues: 1/1) Awareness: Impaired Awareness Impairment: Intellectual impairment Problem Solving: Impaired Problem Solving Impairment: Verbal complex Executive Function: Reasoning;Sequencing;Organizing Reasoning: Impaired Reasoning Impairment: Verbal complex(Abstraction: 1/2) Sequencing: Impaired Sequencing Impairment: Verbal complex(Difficulty drtawing clock: 2/3) Organizing: Appears intact(Backward digt span: 1/2)       Comprehension  Auditory Comprehension Overall Auditory Comprehension: Appears within functional limits for tasks assessed Yes/No Questions: Within Functional Limits Commands: Within Functional Limits(Trail completion: 1/1) Conversation: Complex Reading Comprehension Reading Status: Not tested    Expression Expression Primary Mode of Expression: Verbal Verbal Expression Overall Verbal Expression: Appears within functional limits for tasks assessed Initiation: No impairment Level of Generative/Spontaneous Verbalization: Conversation Repetition: Impaired Level of Impairment: Sentence level(1/2) Naming: Impairment Responsive: Not tested Confrontation: Impaired(2/3) Convergent: Not tested Divergent: (0/1) Pragmatics: No impairment Written Expression Dominant Hand: Right Written Expression:  (Difficulty copying cube: 0/1)   Oral / Motor  Oral Motor/Sensory Function Overall Oral Motor/Sensory Function: Within functional limits Motor Speech Overall Motor Speech: Appears within functional limits for tasks assessed Respiration: Within functional limits Phonation: Normal Resonance: Within functional limits Articulation: Within functional limitis Intelligibility: Intelligible Motor Planning: Witnin functional limits Motor Speech Errors: Not applicable   Twila Rappa I. Vear ClockPhillips, MS, CCC-SLP Acute Rehabilitation Services Office  number [phone] Pager [place] 08/16/2018, 5:24 PM

## 2018-08-16 NOTE — Progress Notes (Addendum)
   Patient tentatively scheduled for TEE this morning. However, he refused to consent for procedure yesterday when I came by. Returned this morning to check back in and answer any remaining questions. Patient again stated that he is not comfortable having this procedure done because he does not want anything going down his throat. I again tried to reassure him and explain that he would receive sedation and would not be fully aware of this. He wants to talk to his mother about the procedure. He tried calling her but she did not answer. She is supposed to be stopping by this morning. Asked RN to page me when she arrives.  Darreld Mclean, PA-C 08/16/2018 7:41 AM  Addendum:  Spoke with patient and his mother. Explained procedure to his mother. She has actually had a TEE before and encouraged patient to proceed with the procedure. After discussing with his mother, he has now agreed to proceed. Answered any remaining questions.  Darreld Mclean, PA-C 08/16/2018 9:29 AM Pager: [phone]

## 2018-08-16 NOTE — Interval H&P Note (Signed)
History and Physical Interval Note:  08/16/2018 9:37 AM  Todd Hood  has presented today for surgery, with the diagnosis of stroke.  The various methods of treatment have been discussed with the patient and family. After consideration of risks, benefits and other options for treatment, the patient has consented to  Procedure(s): TRANSESOPHAGEAL ECHOCARDIOGRAM (TEE) (N/A) as a surgical intervention.  The patient's history has been reviewed, patient examined, no change in status, stable for surgery.  I have reviewed the patient's chart and labs.  Questions were answered to the patient's satisfaction.     Kirk Ruths

## 2018-08-17 ENCOUNTER — Encounter (HOSPITAL_COMMUNITY): Payer: Self-pay | Admitting: Cardiology

## 2018-08-17 ENCOUNTER — Encounter (HOSPITAL_COMMUNITY): Payer: Self-pay

## 2018-08-17 ENCOUNTER — Inpatient Hospital Stay (HOSPITAL_COMMUNITY)
Admission: RE | Admit: 2018-08-17 | Discharge: 2018-09-07 | DRG: 057 | Disposition: A | Discharge status: Home Health | Payer: Medicaid Other | Source: Intra-hospital | Attending: Physical Medicine & Rehabilitation | Admitting: Physical Medicine & Rehabilitation

## 2018-08-17 ENCOUNTER — Other Ambulatory Visit: Payer: Self-pay

## 2018-08-17 DIAGNOSIS — R414 Neurologic neglect syndrome: Secondary | ICD-10-CM | POA: Diagnosis present

## 2018-08-17 DIAGNOSIS — I69322 Dysarthria following cerebral infarction: Secondary | ICD-10-CM | POA: Diagnosis not present

## 2018-08-17 DIAGNOSIS — I10 Essential (primary) hypertension: Secondary | ICD-10-CM | POA: Diagnosis present

## 2018-08-17 DIAGNOSIS — F17210 Nicotine dependence, cigarettes, uncomplicated: Secondary | ICD-10-CM | POA: Diagnosis present

## 2018-08-17 DIAGNOSIS — Z79899 Other long term (current) drug therapy: Secondary | ICD-10-CM | POA: Diagnosis not present

## 2018-08-17 DIAGNOSIS — Z833 Family history of diabetes mellitus: Secondary | ICD-10-CM

## 2018-08-17 DIAGNOSIS — I69392 Facial weakness following cerebral infarction: Secondary | ICD-10-CM

## 2018-08-17 DIAGNOSIS — M21372 Foot drop, left foot: Secondary | ICD-10-CM | POA: Diagnosis present

## 2018-08-17 DIAGNOSIS — Z7982 Long term (current) use of aspirin: Secondary | ICD-10-CM | POA: Diagnosis not present

## 2018-08-17 DIAGNOSIS — R7989 Other specified abnormal findings of blood chemistry: Secondary | ICD-10-CM

## 2018-08-17 DIAGNOSIS — Z7902 Long term (current) use of antithrombotics/antiplatelets: Secondary | ICD-10-CM

## 2018-08-17 DIAGNOSIS — F1910 Other psychoactive substance abuse, uncomplicated: Secondary | ICD-10-CM | POA: Diagnosis present

## 2018-08-17 DIAGNOSIS — F1290 Cannabis use, unspecified, uncomplicated: Secondary | ICD-10-CM | POA: Diagnosis present

## 2018-08-17 DIAGNOSIS — K5901 Slow transit constipation: Secondary | ICD-10-CM | POA: Diagnosis present

## 2018-08-17 DIAGNOSIS — E876 Hypokalemia: Secondary | ICD-10-CM

## 2018-08-17 DIAGNOSIS — I639 Cerebral infarction, unspecified: Secondary | ICD-10-CM | POA: Diagnosis not present

## 2018-08-17 DIAGNOSIS — R7303 Prediabetes: Secondary | ICD-10-CM | POA: Diagnosis present

## 2018-08-17 DIAGNOSIS — H5462 Unqualified visual loss, left eye, normal vision right eye: Secondary | ICD-10-CM | POA: Diagnosis present

## 2018-08-17 DIAGNOSIS — I69354 Hemiplegia and hemiparesis following cerebral infarction affecting left non-dominant side: Secondary | ICD-10-CM | POA: Diagnosis present

## 2018-08-17 DIAGNOSIS — H53462 Homonymous bilateral field defects, left side: Secondary | ICD-10-CM | POA: Diagnosis present

## 2018-08-17 DIAGNOSIS — I69390 Apraxia following cerebral infarction: Secondary | ICD-10-CM | POA: Diagnosis not present

## 2018-08-17 DIAGNOSIS — F482 Pseudobulbar affect: Secondary | ICD-10-CM | POA: Diagnosis present

## 2018-08-17 DIAGNOSIS — E785 Hyperlipidemia, unspecified: Secondary | ICD-10-CM | POA: Diagnosis present

## 2018-08-17 DIAGNOSIS — Z7151 Drug abuse counseling and surveillance of drug abuser: Secondary | ICD-10-CM | POA: Diagnosis not present

## 2018-08-17 DIAGNOSIS — Z823 Family history of stroke: Secondary | ICD-10-CM

## 2018-08-17 DIAGNOSIS — R739 Hyperglycemia, unspecified: Secondary | ICD-10-CM | POA: Diagnosis present

## 2018-08-17 DIAGNOSIS — G8194 Hemiplegia, unspecified affecting left nondominant side: Secondary | ICD-10-CM | POA: Diagnosis not present

## 2018-08-17 DIAGNOSIS — Z23 Encounter for immunization: Secondary | ICD-10-CM | POA: Diagnosis not present

## 2018-08-17 DIAGNOSIS — I6381 Other cerebral infarction due to occlusion or stenosis of small artery: Secondary | ICD-10-CM | POA: Diagnosis present

## 2018-08-17 DIAGNOSIS — G8114 Spastic hemiplegia affecting left nondominant side: Secondary | ICD-10-CM | POA: Diagnosis not present

## 2018-08-17 LAB — BASIC METABOLIC PANEL
Anion gap: 12 (ref 5–15)
BUN: 10 mg/dL (ref 6–20)
CO2: 22 mmol/L (ref 22–32)
Calcium: 9 mg/dL (ref 8.9–10.3)
Chloride: 106 mmol/L (ref 98–111)
Creatinine, Ser: 0.85 mg/dL (ref 0.61–1.24)
GFR calc Af Amer: >60 mL/min (ref >60)
GFR calc non Af Amer: >60 mL/min (ref >60)
Glucose, Bld: 118 mg/dL — ABNORMAL HIGH (ref 70–99)
Potassium: 3.2 mmol/L — ABNORMAL LOW (ref 3.5–5.1)
Sodium: 140 mmol/L (ref 135–145)

## 2018-08-17 MED ORDER — DIPHENHYDRAMINE HCL 12.5 MG/5ML PO ELIX: 12.5000 mg | ORAL_SOLUTION | Freq: Four times a day (QID) | ORAL | Status: DC | PRN

## 2018-08-17 MED ORDER — ALUM & MAG HYDROXIDE-SIMETH 200-200-20 MG/5ML PO SUSP: 30.0000 mL | ORAL | Status: DC | PRN

## 2018-08-17 MED ORDER — PNEUMOCOCCAL VAC POLYVALENT 25 MCG/0.5ML IJ INJ
0.5000 mL | INJECTION | INTRAMUSCULAR | Status: AC
  Administered 2018-08-18: 0.5 mL via INTRAMUSCULAR

## 2018-08-17 MED ORDER — POTASSIUM CHLORIDE CRYS ER 20 MEQ PO TBCR
40.0000 meq | EXTENDED_RELEASE_TABLET | ORAL | Status: AC
  Administered 2018-08-17 (×2): 40 meq via ORAL
  Filled 2018-08-17 (×2): qty 2

## 2018-08-17 MED ORDER — POTASSIUM CHLORIDE CRYS ER 20 MEQ PO TBCR
20.0000 meq | EXTENDED_RELEASE_TABLET | Freq: Two times a day (BID) | ORAL | Status: AC
  Administered 2018-08-17 – 2018-08-18 (×3): 20 meq via ORAL
  Filled 2018-08-17 (×3): qty 1

## 2018-08-17 MED ORDER — PROCHLORPERAZINE EDISYLATE 10 MG/2ML IJ SOLN: 5.0000 mg | Freq: Four times a day (QID) | INTRAMUSCULAR | Status: DC | PRN

## 2018-08-17 MED ORDER — ASPIRIN 300 MG RE SUPP
300.0000 mg | Freq: Every day | RECTAL | Status: DC
  Filled 2018-08-17: qty 1

## 2018-08-17 MED ORDER — ATORVASTATIN CALCIUM 80 MG PO TABS
80.0000 mg | ORAL_TABLET | Freq: Every day | ORAL | Status: DC
  Administered 2018-08-17 – 2018-09-06 (×21): 80 mg via ORAL
  Filled 2018-08-17 (×22): qty 1

## 2018-08-17 MED ORDER — ENOXAPARIN SODIUM 40 MG/0.4ML ~~LOC~~ SOLN
40.0000 mg | SUBCUTANEOUS | Status: DC
  Administered 2018-08-17 – 2018-09-06 (×21): 40 mg via SUBCUTANEOUS
  Filled 2018-08-17 (×21): qty 0.4

## 2018-08-17 MED ORDER — BISACODYL 10 MG RE SUPP
10.0000 mg | Freq: Every day | RECTAL | Status: DC | PRN
  Administered 2018-08-21: 17:00:00 10 mg via RECTAL
  Filled 2018-08-17 (×2): qty 1

## 2018-08-17 MED ORDER — POLYETHYLENE GLYCOL 3350 17 G PO PACK
17.0000 g | PACK | Freq: Every day | ORAL | Status: DC | PRN
  Administered 2018-08-19: 17 g via ORAL
  Filled 2018-08-17 (×5): qty 1

## 2018-08-17 MED ORDER — ASPIRIN 325 MG PO TABS: 325.0000 mg | ORAL_TABLET | Freq: Every day | ORAL | Status: DC

## 2018-08-17 MED ORDER — ASPIRIN 325 MG PO TABS
325.0000 mg | ORAL_TABLET | Freq: Every day | ORAL | Status: DC
  Administered 2018-08-18 – 2018-09-06 (×20): 325 mg via ORAL
  Filled 2018-08-17 (×20): qty 1

## 2018-08-17 MED ORDER — CLOPIDOGREL BISULFATE 75 MG PO TABS
75.0000 mg | ORAL_TABLET | Freq: Every day | ORAL | Status: DC
  Administered 2018-08-18 – 2018-09-07 (×21): 75 mg via ORAL
  Filled 2018-08-17 (×21): qty 1

## 2018-08-17 MED ORDER — POLYETHYLENE GLYCOL 3350 17 G PO PACK
17.0000 g | PACK | Freq: Every day | ORAL | Status: DC | PRN
  Administered 2018-08-20 – 2018-08-25 (×3): 17 g via ORAL
  Filled 2018-08-17: qty 1

## 2018-08-17 MED ORDER — PROCHLORPERAZINE MALEATE 5 MG PO TABS: 5.0000 mg | ORAL_TABLET | Freq: Four times a day (QID) | ORAL | Status: DC | PRN

## 2018-08-17 MED ORDER — POLYETHYLENE GLYCOL 3350 17 G PO PACK
17.0000 g | PACK | Freq: Every day | ORAL | Status: DC | PRN
  Administered 2018-08-17: 17 g via ORAL

## 2018-08-17 MED ORDER — ACETAMINOPHEN 325 MG PO TABS
325.0000 mg | ORAL_TABLET | ORAL | Status: DC | PRN
  Administered 2018-09-05: 650 mg via ORAL
  Administered 2018-09-05: 325 mg via ORAL
  Administered 2018-09-05: 650 mg via ORAL
  Filled 2018-08-17 (×3): qty 2

## 2018-08-17 MED ORDER — FLEET ENEMA 7-19 GM/118ML RE ENEM
1.0000 | ENEMA | Freq: Once | RECTAL | Status: AC | PRN
  Administered 2018-08-29: 06:00:00 1 via RECTAL
  Filled 2018-08-17 (×2): qty 1

## 2018-08-17 MED ORDER — CLONIDINE HCL 0.1 MG PO TABS
0.1000 mg | ORAL_TABLET | Freq: Four times a day (QID) | ORAL | Status: DC | PRN
  Administered 2018-08-22: 0.1 mg via ORAL
  Filled 2018-08-17: qty 1

## 2018-08-17 MED ORDER — PROCHLORPERAZINE 25 MG RE SUPP: 12.5000 mg | Freq: Four times a day (QID) | RECTAL | Status: DC | PRN

## 2018-08-17 MED ORDER — SENNOSIDES-DOCUSATE SODIUM 8.6-50 MG PO TABS
2.0000 | ORAL_TABLET | Freq: Every day | ORAL | Status: DC
  Administered 2018-08-17 – 2018-09-06 (×21): 2 via ORAL
  Filled 2018-08-17 (×21): qty 2

## 2018-08-17 MED ORDER — GUAIFENESIN-DM 100-10 MG/5ML PO SYRP: 5.0000 mL | ORAL_SOLUTION | Freq: Four times a day (QID) | ORAL | Status: DC | PRN

## 2018-08-17 MED ORDER — TRAZODONE HCL 50 MG PO TABS
25.0000 mg | ORAL_TABLET | Freq: Every evening | ORAL | Status: DC | PRN
  Administered 2018-08-29: 50 mg via ORAL
  Filled 2018-08-17 (×4): qty 1

## 2018-08-17 MED ORDER — ADULT MULTIVITAMIN W/MINERALS CH
1.0000 | ORAL_TABLET | Freq: Every day | ORAL | Status: DC
  Administered 2018-08-18 – 2018-09-07 (×21): 1 via ORAL
  Filled 2018-08-17 (×21): qty 1

## 2018-08-17 NOTE — H&P (Signed)
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 History taken from chart review and patient. Patient had stopped taking his Plavix due to issues with dental pain.  CTA head neck/perfusion was negative for LVO ischemia and showed chronic right PCA occlusion with right-PCA territory encephalomalacia. MRI brain done revealing acute right thalamic, midbrain and internal capsule infarcts.  UDS was positive for THC. BLE Dopplers negative for DVT.   He had 30-day event monitor earlier this year that was negative for A. fib.  Dr. Leonie Man felt that stroke was embolic due to unknown source and loop recorder recommended however patient declined this.  He had TEE on 08/16/2018 showing EF of 50-60% with intra-atrial septal aneurysm.  No shunting or thrombus noted.   Patient continues to be limited by left hemiparesis with right proptosis, left hand anonymous hemianopsia and left lateral lean with standing.  Therapy ongoing and CIR recommended due to functional decline. Please see preadmission assessment from today as well.  Review of Systems  Constitutional: Negative for chills and fever.  HENT: Negative for hearing loss and tinnitus.   Eyes:       Left field cut.   Respiratory: Negative for cough and shortness of breath.   Cardiovascular: Negative for chest pain and palpitations.  Gastrointestinal: Positive for constipation. Negative for heartburn and nausea.  Genitourinary: Negative for dysuria.  Musculoskeletal: Negative for myalgias.  Skin: Negative for rash.  Neurological: Positive for speech change, focal weakness and weakness. Negative  for dizziness, sensory change and headaches.  Psychiatric/Behavioral: Negative for memory loss. The patient does not have insomnia.   All other systems reviewed and are negative.    Past Medical History:  Diagnosis Date   CVA (cerebral vascular accident) [facility])    Hypertension     Past Surgical History:  Procedure Laterality Date   BUBBLE STUDY  08/16/2018   Procedure: BUBBLE STUDY;  Surgeon: Lelon Perla, MD;  Location: [facility] ENDOSCOPY;  Service: Cardiovascular;;   TEE WITHOUT CARDIOVERSION N/A 08/16/2018   Procedure: TRANSESOPHAGEAL ECHOCARDIOGRAM (TEE);  Surgeon: Lelon Perla, MD;  Location: [facility] ENDOSCOPY;  Service: Cardiovascular;  Laterality: N/A;    Family History  Problem Relation Age of Onset   Diabetes Mother    Diabetes Father    Sickle cell trait Father    Stroke Father      Social History: Lives with girlfriend.  Was working as Metallurgist.  He reports that he has been smoking. He has been smoking about 0.25 packs per day. He has never used smokeless tobacco. He reports current alcohol use. He reports current drug use. Drug: Marijuana daily.    Allergies: No Known Allergies    Medications Prior to Admission  Medication Sig Dispense Refill   acetaminophen (TYLENOL) 650 MG CR tablet Take 650 mg by mouth every 8 (eight) hours as needed for pain.     amLODipine (NORVASC) 10 MG tablet Take 10 mg by mouth at bedtime.      aspirin EC 81 MG tablet Take 81 mg by mouth every morning.      atorvastatin (LIPITOR) 80 MG tablet Take 80  mg by mouth at bedtime.     clopidogrel (PLAVIX) 75 MG tablet Take 75 mg by mouth at bedtime.      gabapentin (NEURONTIN) 300 MG capsule Take 300 mg by mouth 2 (two) times daily. Morning and bedtime     losartan (COZAAR) 50 MG tablet Take 50 mg by mouth every morning.      Multiple Vitamins-Minerals (CENTRUM SILVER 50+MEN) TABS Take 1 tablet by mouth daily with breakfast.      Drug Regimen Review  Drug regimen  was reviewed and remains appropriate with no significant issues identified  Home: Home Living Family/patient expects to be discharged to:: Private residence Living Arrangements: Other relatives, Spouse/significant other Available Help at Discharge: Family, Available 24 hours/day Type of Home: Other(Comment)(townhouse) Home Access: Stairs to enter [employer] of Steps: (pt is unsure) Home Layout: Two level, Bed/bath upstairs Bathroom Shower/Tub: Chiropodist: Standard Home Equipment: Environmental consultant - 2 wheels, Bedside commode, Shower seat Additional Comments: information from OT interview  Lives With: Significant other, Family   Functional History: Prior Function Level of Independence: Independent Comments: was able to walk with no AD  Functional Status:  Mobility: Bed Mobility Overal bed mobility: Needs Assistance Bed Mobility: Supine to Sit Supine to sit: Mod assist Sit to supine: Mod assist General bed mobility comments: mod for lifting up trunk Transfers Overall transfer level: Needs assistance Equipment used: Rolling walker (2 wheeled) Transfers: Sit to/from Stand Sit to Stand: Mod assist, From elevated surface General transfer comment: min to mod assist standing at EOB with LLE instability, Lt lean in standing with no awareness Ambulation/Gait Ambulation/Gait assistance: Mod assist, Max assist, +2 physical assistance, +2 safety/equipment Gait Distance (Feet): 5 Feet Assistive device: Rolling walker (2 wheeled) Gait Pattern/deviations: Step-to pattern, Decreased stride length, Decreased stance time - left, Decreased step length - left, Decreased weight shift to right General Gait Details: pt with heavy L lateral bias; cues for weight shifting, sequencing, and L step length; assistance required for balance and guiding RW Gait velocity: reduced Gait velocity interpretation: <1.8 ft/sec, indicate of risk for recurrent falls    ADL: ADL Overall  ADL's : Needs assistance/impaired Grooming: Minimal assistance, Standing, Moderate assistance Grooming Details (indicate cue type and reason): Min assist for standing balance, Mod assist for dynamic standing when reaching outside BOS Upper Body Bathing: Minimal assistance, Sitting Lower Body Bathing: Moderate assistance, Sit to/from stand Upper Body Dressing : Minimal assistance, Sitting Lower Body Dressing: Sit to/from stand, Moderate assistance Toilet Transfer: Moderate assistance, +2 for physical assistance, RW, BSC Toilet Transfer Details (indicate cue type and reason): simulated stand pivot transfer with RW, +2 for weight shifting and safety during mobility due to LLE instability Functional mobility during ADLs: Moderate assistance, +2 for physical assistance General ADL Comments: +2 for transfers, mod assist standing balance  Cognition: Cognition Overall Cognitive Status: Impaired/Different from baseline Arousal/Alertness: Awake/alert Orientation Level: Oriented X4 Attention: Focused, Sustained Focused Attention: Appears intact(Vigialnce WNL: 1/1) Sustained Attention: Appears intact(Serial 7s: 3/3) Memory: Impaired Memory Impairment: Storage deficit, Retrieval deficit, Decreased recall of new information(Immediate: 5/5; delayed: 4/5 with cues: 1/1) Awareness: Impaired Awareness Impairment: Intellectual impairment Problem Solving: Impaired Problem Solving Impairment: Verbal complex Executive Function: Reasoning, Sequencing, Organizing Reasoning: Impaired Reasoning Impairment: Verbal complex(Abstraction: 1/2) Sequencing: Impaired Sequencing Impairment: Verbal complex(Difficulty drtawing clock: 2/3) Organizing: Appears intact(Backward digt span: 1/2) Cognition Arousal/Alertness: Awake/alert Behavior During Therapy: Flat affect Overall Cognitive Status: Impaired/Different from baseline Area of Impairment: Following commands, Safety/judgement, Awareness, Problem  solving Following Commands: Follows one  step commands consistently, Follows multi-step commands inconsistently, Follows multi-step commands with increased time Safety/Judgement: Decreased awareness of safety, Decreased awareness of deficits Awareness: Intellectual, Emergent Problem Solving: Difficulty sequencing, Requires verbal cues, Requires tactile cues General Comments: leans laterally with no correction to stop it  Physical Exam: Blood pressure (!) 144/104, pulse 72, temperature 98.8 F (37.1 C), temperature source Oral, resp. rate 17, height 5\' 9"  (1.753 m), weight 91.6 kg, SpO2 98 %. Physical Exam  Vitals reviewed. Constitutional: He appears well-developed and well-nourished.  HENT:  Right eye ptosis  Eyes: Right eye exhibits no discharge. Left eye exhibits no discharge.  Unable to open left eyelid  Respiratory: Effort normal. No stridor. No respiratory distress.  GI: He exhibits no distension.  Musculoskeletal:     Comments: No edema or tenderness in extremities  Neurological: He is alert.  Motor: RUE/RLE: 5/5 proximal to distal LUE: Should abduction 1+/5, elbow flex/ext 2-/5, hand grip 3/5 with apraxia LLE: HF, KE 3-/5 with apraxia, ADF 0/5 Sensation intact to light touch  Skin: Skin is warm and dry.  Psychiatric: He has a normal mood and affect. His behavior is normal.    Results for orders placed or performed during the hospital encounter of 08/14/18 (from the past 48 hour(s))  Lipid panel     Status: Abnormal   Collection Time: 08/16/18  4:23 AM  Result Value Ref Range   Cholesterol 136 0 - 200 mg/dL   Triglycerides 161112 <096<150 mg/dL   HDL 30 (L) >04>40 mg/dL   Total CHOL/HDL Ratio 4.5 RATIO   VLDL 22 0 - 40 mg/dL   LDL Cholesterol 84 0 - 99 mg/dL    Comment:        Total Cholesterol/HDL:CHD Risk Coronary Heart Disease Risk Table                     Men   Women  1/2 Average Risk   3.4   3.3  Average Risk       5.0   4.4  2 X Average Risk   9.6   7.1  3 X Average  Risk  23.4   11.0        Use the calculated Patient Ratio above and the CHD Risk Table to determine the patient's CHD Risk.        ATP III CLASSIFICATION (LDL):  <100     mg/dL   Optimal  [id number]  mg/dL   Near or Above                    Optimal  130-159  mg/dL   Borderline  191-478160-189  mg/dL   High  >295>190     mg/dL   Very High Performed at [facility], [address] [place], [place] [address]   Basic metabolic panel     Status: Abnormal   Collection Time: 08/17/18  4:35 AM  Result Value Ref Range   Sodium 140 135 - 145 mmol/L   Potassium 3.2 (L) 3.5 - 5.1 mmol/L   Chloride 106 98 - 111 mmol/L   CO2 22 22 - 32 mmol/L   Glucose, Bld 118 (H) 70 - 99 mg/dL   BUN 10 6 - 20 mg/dL   Creatinine, Ser 8.650.85 0.61 - 1.24 mg/dL   Calcium 9.0 8.9 - 78.410.3 mg/dL   GFR calc non Af Amer >60 >60 mL/min   GFR calc Af Amer >60 >60 mL/min   Anion gap 12 5 -  15    Comment: Performed at [facility], [address] [place], [place] [address]   Ct Head Wo Contrast  Result Date: 08/16/2018 CLINICAL DATA:  Stroke, follow-up, headache EXAM: CT HEAD WITHOUT CONTRAST TECHNIQUE: Contiguous axial images were obtained from the base of the skull through the vertex without intravenous contrast. COMPARISON:  08/14/2018 CT head and MR brain exams FINDINGS: Brain: Normal ventricular morphology. No midline shift. Old RIGHT occipital infarct. Evolving subacute infarct involving the RIGHT thalamus and midbrain. No hemorrhagic transformation. No new areas of infarction, intracranial hemorrhage, or mass lesion. Vascular: No definite hyperdense vessels Skull: Intact Sinuses/Orbits: Clear Other: N/A IMPRESSION: Old infarct RIGHT occipital lobe. Evolving subacute infarct RIGHT thalamus and midbrain. No new intracranial abnormalities. Electronically Signed   By: Ulyses SouthwardMark  Boles M.D.   On: 08/16/2018 15:55       Medical Problem List and Plan: 1. Left hemiparesis with right proptosis, left hand anonymous hemianopsia  and left lateral lean secondary to  acute right thalamic, midbrain and internal capsule infarcts.  Admit to CIR. 2.  Antithrombotics: -DVT/anticoagulation:  Pharmaceutical: Lovenox  -antiplatelet therapy: ASA and Plavix x3 weeks followed by Plavix alone 3. Pain Management: N/A 4. Mood: LCSW to follow for evaluation and support  -antipsychotic agents: N/A 5. Neuropsych: This patient is ?fully capable of making decisions on his own behalf. 6. Skin/Wound Care: Routine pressure relief measures 7. Fluids/Electrolytes/Nutrition: Monitor I/Os.    BMP ordered for tomorrow AM 8.  Prediabetes: Hemoglobin A1c is-6.2. Carb modified restrictions added to diet.  Will have dietitian educate patient on appropriate diet.  Monitor blood sugars AC at bedtime for 2 to 3 days  Monitor with increased mobility 9.  Hypokalemia: Will add K-Dur for supplement x2 days  Labs ordered for tomroow 10.  Dyslipidemia: On Lipitor 11.  HTN: Permissive hypertension < 220/120 for couple more days.  Long-term goal is normotensive  Monitor with increase mobility. Avoid hypoperfusion 12. Substance abuse: Counsel  Jacquelynn Creeamela S Love, [place] JerseyPA-C 08/17/2018

## 2018-08-17 NOTE — Plan of Care (Signed)
  Problem: Coping: Goal: Will verbalize positive feelings about self Outcome: Progressing   

## 2018-08-17 NOTE — Progress Notes (Addendum)
STROKE TEAM PROGRESS NOTE   INTERVAL HISTORY Patient had neurological worsening of his existing left hemiparesis following TEE yesterday.  He was given IV normal saline 1 g bolus and had a stat CT scan of the head which showed no new changes.  This morning he continues to have increased left arm weakness compared to his admission few days ago but is otherwise stable.  He has been evaluated by inpatient rehab and final decision is pending.  TEE yesterday showed no cardiac source of embolism or PFO but did show atrial septal aneurysm.  Patient refused loop recorder.  30-day heart monitor done previously at [facility] was negative for cardiac arrhythmias Vitals:   08/16/18 2024 08/17/18 0015 08/17/18 0428 08/17/18 0825  BP: (!) 150/105 (!) 150/93 (!) 148/97 (!) 145/101  Pulse: 72 74 65 78  Resp: 19 20 (!) 22 15  Temp: 98.5 F (36.9 C) 98.5 F (36.9 C) 98.4 F (36.9 C) 98.8 F (37.1 C)  TempSrc: Oral Oral Oral Axillary  SpO2: 100% 100% 100% 100%  Weight:      Height:        CBC:  Recent Labs  Lab 08/14/18 1106 08/14/18 1123  WBC 12.7*  --   NEUTROABS 10.3*  --   HGB 15.4 15.3  HCT 43.2 45.0  MCV 88.3  --   PLT 185  --     Basic Metabolic Panel:  Recent Labs  Lab 08/14/18 1106 08/14/18 1123 08/17/18 0435  NA 141 143 140  K 3.6 3.7 3.2*  CL 108 107 106  CO2 24  --  22  GLUCOSE 155* 149* 118*  BUN 19 21* 10  CREATININE 1.07 1.00 0.85  CALCIUM 9.3  --  9.0   Lipid Panel:     Component Value Date/Time   CHOL 136 08/16/2018 0423   TRIG 112 08/16/2018 0423   HDL 30 (L) 08/16/2018 0423   CHOLHDL 4.5 08/16/2018 0423   VLDL 22 08/16/2018 0423   LDLCALC 84 08/16/2018 0423   HgbA1c:  Lab Results  Component Value Date   HGBA1C 6.2 (H) 08/15/2018   Urine Drug Screen:     Component Value Date/Time   LABOPIA NONE DETECTED 08/14/2018 1133   COCAINSCRNUR NONE DETECTED 08/14/2018 1133   LABBENZ NONE DETECTED 08/14/2018 1133   AMPHETMU NONE DETECTED 08/14/2018 1133   THCU  POSITIVE (A) 08/14/2018 1133   LABBARB NONE DETECTED 08/14/2018 1133    Alcohol Level     Component Value Date/Time   ETH <10 08/14/2018 1106    IMAGING Ct Head Wo Contrast  Result Date: 08/16/2018 CLINICAL DATA:  Stroke, follow-up, headache EXAM: CT HEAD WITHOUT CONTRAST TECHNIQUE: Contiguous axial images were obtained from the base of the skull through the vertex without intravenous contrast. COMPARISON:  08/14/2018 CT head and MR brain exams FINDINGS: Brain: Normal ventricular morphology. No midline shift. Old RIGHT occipital infarct. Evolving subacute infarct involving the RIGHT thalamus and midbrain. No hemorrhagic transformation. No new areas of infarction, intracranial hemorrhage, or mass lesion. Vascular: No definite hyperdense vessels Skull: Intact Sinuses/Orbits: Clear Other: N/A IMPRESSION: Old infarct RIGHT occipital lobe. Evolving subacute infarct RIGHT thalamus and midbrain. No new intracranial abnormalities. Electronically Signed   By: Lavonia Dana M.D.   On: 08/16/2018 15:55   Vas [place] Transcranial Doppler W Bubbles  Result Date: 08/16/2018  Transcranial Doppler with Bubble Indications: Stroke. Performing Technologist: Abram Sander RVS  Examination Guidelines: A complete evaluation includes B-mode imaging, spectral Doppler, color Doppler, and power Doppler as  needed of all accessible portions of each vessel. Bilateral testing is considered an integral part of a complete examination. Limited examinations for reoccurring indications may be performed as noted.  Summary:  A vascular evaluation was performed. The left middle cerebral artery was studied. An IV was inserted into the patient's right hand. Verbal informed consent was obtained.  No HITS heard during rest. No HITS heard during valsalva. Negative TCD Bubble study *See table(s) above for measurements and observations.  Diagnosing physician: Delia Heady MD Electronically signed by Delia Heady MD on 08/16/2018 at 1:11:31 PM.    Final     Vas [place] Lower Extremity Venous (dvt)  Result Date: 08/16/2018  Lower Venous Study Indications: Stroke.  Comparison Study: No prior study on file for comparison. Performing Technologist: Sherren Kerns RVS  Examination Guidelines: A complete evaluation includes B-mode imaging, spectral Doppler, color Doppler, and power Doppler as needed of all accessible portions of each vessel. Bilateral testing is considered an integral part of a complete examination. Limited examinations for reoccurring indications may be performed as noted.  +---------+---------------+---------+-----------+----------+-------+ RIGHT    CompressibilityPhasicitySpontaneityPropertiesSummary +---------+---------------+---------+-----------+----------+-------+ CFV      Full           Yes      Yes                          +---------+---------------+---------+-----------+----------+-------+ SFJ      Full                                                 +---------+---------------+---------+-----------+----------+-------+ FV Prox  Full                                                 +---------+---------------+---------+-----------+----------+-------+ FV Mid   Full                                                 +---------+---------------+---------+-----------+----------+-------+ FV DistalFull                                                 +---------+---------------+---------+-----------+----------+-------+ PFV      Full                                                 +---------+---------------+---------+-----------+----------+-------+ POP      Full           Yes      Yes                          +---------+---------------+---------+-----------+----------+-------+ PTV      Full                                                 +---------+---------------+---------+-----------+----------+-------+  PERO     Full                                                  +---------+---------------+---------+-----------+----------+-------+   +---------+---------------+---------+-----------+----------+-------+ LEFT     CompressibilityPhasicitySpontaneityPropertiesSummary +---------+---------------+---------+-----------+----------+-------+ CFV      Full           Yes      Yes                          +---------+---------------+---------+-----------+----------+-------+ SFJ      Full                                                 +---------+---------------+---------+-----------+----------+-------+ FV Prox  Full                                                 +---------+---------------+---------+-----------+----------+-------+ FV Mid   Full                                                 +---------+---------------+---------+-----------+----------+-------+ FV DistalFull                                                 +---------+---------------+---------+-----------+----------+-------+ PFV      Full                                                 +---------+---------------+---------+-----------+----------+-------+ POP      Full           Yes      Yes                          +---------+---------------+---------+-----------+----------+-------+ PTV      Full                                                 +---------+---------------+---------+-----------+----------+-------+ PERO     Full                                                 +---------+---------------+---------+-----------+----------+-------+     Summary: Right: There is no evidence of deep vein thrombosis in the lower extremity. Left: There is no evidence of deep vein thrombisis in the lower extremity.  *See table(s) above for measurements and observations. Electronically signed by Waverly Ferrarihristopher Dickson MD on 08/16/2018 at 1:34:47 PM.  Final     PHYSICAL EXAM Pleasant young African-American male not in distress. . Afebrile. Head is nontraumatic. Neck is supple  without bruit.    Cardiac exam no murmur or gallop. Lungs are clear to auscultation. Distal pulses are well felt. Neurological Exam :  Patient is is awake alert and interactive.  He continues to have mechanical ptosis of the right eye and partial drooping of the left eye which he can overcome with forced eye opening.  He has dysconjugate eyes with skew eye deviation with left eye deviated down.  He has right gaze paresis in both eyes cannot move to the right and move only partially  Is able to look to the left past midline but has some restriction of lateral gaze in the left eye.  He has restriction of vertical gaze in both eyes left more than right.  His down gaze seems to be better preserved.  And again left eye moves greater than right he denies diplopia and there is no nystagmus.  He has a dense left homonymous hemianopsia which is old from his previous stroke.  Face is symmetric without weakness Motor system exam shows left hemiparesis with 2/5 strength on the left upper extremity with significant weakness of left grip intrinsic hand muscles.  Left lower extremity strength is 3/5.  Reflexes 2+ symmetric.  Touch pinprick sensation appears intact.  Coordination slightly impaired on the left.  Gait severely ataxic needs 2 person assist to stand and unable to walk and lift his left leg ASSESSMENT/PLAN Mr. Todd Hood is a 43 y.o. male with history of HTN, HLD, Vit D deficiency, prediabetic, hx R PCA stroke Feb 2020 with residual deficits of visual impairment and L sided weakness presenting with left sided weakness, gait unsteadiness, worsening somnolence and ocular motility deficit.  Neurological Exam :  Stroke:   R thalamic, midbtrain and internal capsule infarct likely of cryptogenic etiology in setting of recent embolic right posterior cerebral artery infarct in Feb of - both embolic secondary to unknown source  Code Stroke CT head ? hyperdensity R ICA terminus/MCA origin. No acute abnormality. Old R  PCA infarct. ASPECTS 10.     CTA head & neck no ELVO. Chronic R PCA occlusion. estatic BA. Mild irregularity B ICAs.  CT perfusion No acute stroke  MRI  R thalamic, midbrain and internal capsule infarcts. Old R occipital and posterior temporal PCA infarcts. Mild SVD.  TCD w/ bubble negative for PFO  2D Echo normal  TEE to look for embolic source. Arranged with [facility] for tomorrow.  If positive for PFO (patent foramen ovale), check bilateral lower extremity venous dopplers to rule out DVT as possible source of stroke. (I have made patient NPO after midnight tonight).   Recent 30d monitor neg for AF  Check LE dopplers for DVT no DVT  loop recorder placement pending   LDL 84 mg %  HgbA1c 6.2  Lovenox 40 mg sq daily for VTE prophylaxis  aspirin 81 mg daily prior to admission (had stopped taking prescribed plavix from Feb d/t planned dental procedure), now on aspirin 325 mg daily and clopidogrel 75 mg daily following load.   Therapy recommendations:  CIR  Disposition:  pending   Hypertension  Elevated on arrival 152/115  Stable now . Permissive hypertension (OK if < 220/120) but gradually normalize in 5-7 days . Long-term BP goal normotensive  Hyperlipidemia  Home meds:  lipitor 80, resumed in hospital  LDL pending, goal < 70  Continue statin at discharge  PreDiabetes  HgbA1c 6.2  Other Stroke Risk Factors  UDS positive for THC. advised to stop using  Cigarette smoker, advised to stop smoking  ETOH use, advised to drink no more than 2 drink(s) a day  Obesity, Body mass index is 29.82 kg/m., recommend weight loss, diet and exercise as appropriate   Hx stroke/TIA  02/2018 R PCA infarct. D/c on aspirin and plavix. 30d monitor neg for AF.   Family hx stroke (paternal grandmother, paternal uncle)  Other Active Problems  Mild AKI  Hospital day # 3   He presented with sudden onset of increased left-sided weakness, unsteady gait  and worsening somnolence and eye movement abnormalities secondary to large right thalamic and midbrain infarct.  He had a recent right PCA infarct of cryptogenic origin in February 2020.  Patient has refused   loop recorder for paroxysmal A. fib.  Check antiphospholipid antibodies, sickle cell screen and ANA panel.  Aspirin and Plavix for 3 weeks followed by Plavix alone.  Patient was a screen failure in  sleep smart trial if interested. Greater than 50% time during this 25-minute visit was spent on counseling and coordination of care about his recurrent cryptogenic stroke and answering questions and discussing evaluation and treatment plan.  Discussed with Dr. Butler Denmarkizwan.follow-up as an outpatient in the stroke clinic in 6 weeks.  Stroke team will sign off.  Kindly call for questions.   Delia HeadyPramod Leshae Mcclay, MD Medical Director [facility] Pager: [phone] 08/17/2018 10:08 AM   To contact Stroke Continuity provider, please refer to [email or web address]. After hours, contact General Neurology

## 2018-08-17 NOTE — Progress Notes (Signed)
Jamse Arn, MD  Physician  Physical Medicine and Rehabilitation  PMR Pre-admission  Signed  Date of Service:  08/17/2018 5:02 PM      Related encounter: ED to Hosp-Admission (Discharged) from 08/14/2018 in [facility]      Signed         PMR Admission Coordinator Pre-Admission Assessment  Patient: Todd Hood is an 43 y.o., male MRN: [id number] DOB: [birth year] Height: 5' 9"  (175.3 cm) Weight: 91.6 kg  Insurance Information HMO:     PPO:      PCP:      IPA:      80/20:      OTHER:  PRIMARY: Uninsured (self pay)      Policy#:       Subscriber:  CM Name:       Phone#:      Fax#:  Pre-Cert#:       Employer:  Benefits:  Phone #:      Name:  Eff. Date:      Deduct:       Out of Pocket Max:       Life Max:  CIR: Pt is aware of estimated daily cost of care ($3,500). Pt has been in contact with financial counselor, Velora Heckler. Per Crystal she planned on submitting MAD application 8/5.       SNF: Outpatient:      Co-Pay:  Home Health:       Co-Pay:  DME:      Co-Pay:  Providers:  SECONDARY:       Policy#:       Subscriber:  CM Name:       Phone#:      Fax#:  Pre-Cert#:       Employer:  Benefits:  Phone #:      Name:  Eff. Date:     Deduct:       Out of Pocket Max:       Life Max:  CIR:       SNF:  Outpatient:      Co-Pay:  Home Health:       Co-Pay:  DME:      Co-Pay:   Medicaid Application Date:       Case Manager:  Disability Application Date:       Case Worker:   The "Data Collection Information Summary" for patients in Inpatient Rehabilitation Facilities with attached "Privacy Act [place] Records" was provided and verbally reviewed with: N/A  Emergency Contact Information         Contact Information    Name Relation Home Work [place] Mother [phone]  [phone]   Simmons,Toshia Significant other   [phone]      Current Medical History  Patient Admitting Diagnosis: Right thalamic,  midbrain, and internal capsule infarct.  History of Present Illness: Pt is a 43 yo M with history of large subacute right PCA stroke back in February of this year. He has residual left visual deficits and left sided weakness. Pt also has history of HTN, prediabetes, and HLD. He presented to [facility] on 8/2 with increasing left sided weakness and gait disturbance. An MRI on 8/2 shoed an acute right midbrain, thalamic, and internal capsule infarcts. It also showed chronic right occipital and posterior temporal PCA territory infarcts. His 2D echo was unrevealing and his bubble study was negative for PFO. He is to follow up as an outpatient for an intra-atrial septral aneurysm. Pt has been evaluated by therapies  with recommendations for CIR. Pt is to be admitted to CIR on 08/17/2018.   Complete NIHSS TOTAL: 8  Patient's medical record from [facility] has been reviewed by the rehabilitation admission coordinator and physician.  Past Medical History      Past Medical History:  Diagnosis Date  . CVA (cerebral vascular accident) ([place])   . Hypertension     Family History   family history is not on file.  Prior Rehab/Hospitalizations Has the patient had prior rehab or hospitalizations prior to admission? Yes  Has the patient had major surgery during 100 days prior to admission? Yes             Current Medications  Current Facility-Administered Medications:  .  acetaminophen (TYLENOL) tablet 650 mg, 650 mg, Oral, Q4H PRN, 650 mg at 08/14/18 1824 **OR** acetaminophen (TYLENOL) solution 650 mg, 650 mg, Per Tube, Q4H PRN **OR** acetaminophen (TYLENOL) suppository 650 mg, 650 mg, Rectal, Q4H PRN, Lelon Perla, MD .  aspirin suppository 300 mg, 300 mg, Rectal, Daily **OR** aspirin tablet 325 mg, 325 mg, Oral, Daily, Lelon Perla, MD, 325 mg at 08/17/18 1008 .  atorvastatin (LIPITOR) tablet 80 mg, 80 mg, Oral, QHS, Lelon Perla, MD, 80 mg at 08/16/18 2209 .  clopidogrel  (PLAVIX) tablet 75 mg, 75 mg, Oral, Daily, Lelon Perla, MD, 75 mg at 08/17/18 1008 .  enoxaparin (LOVENOX) injection 40 mg, 40 mg, Subcutaneous, Q24H, Crenshaw, Denice Bors, MD, 40 mg at 08/16/18 2209 .  multivitamin with minerals tablet 1 tablet, 1 tablet, Oral, Q breakfast, Crenshaw, Denice Bors, MD, 1 tablet at 08/17/18 1008 .  polyethylene glycol (MIRALAX / GLYCOLAX) packet 17 g, 17 g, Oral, Daily PRN, Debbe Odea, MD, 17 g at 08/17/18 1702 .  senna-docusate (Senokot-S) tablet 1 tablet, 1 tablet, Oral, QHS, Lelon Perla, MD, 1 tablet at 08/16/18 2209  Patients Current Diet:     Diet Order                  Diet Heart Room service appropriate? Yes; Fluid consistency: Thin  Diet effective now               Precautions / Restrictions Precautions Precautions: Fall Precaution Comments: R eye ptosis  Restrictions Weight Bearing Restrictions: No Other Position/Activity Restrictions: Can see better to L side as R eye is closed   Has the patient had 2 or more falls or a fall with injury in the past year? Yes  Prior Activity Level Community (5-7x/wk): very active PTA; worked at [employer] for state of [place]. Lives with gf and cousin. did not drive due to prior CVA vision issues  Prior Functional Level Self Care: Did the patient need help bathing, dressing, using the toilet or eating? Independent  Indoor Mobility: Did the patient need assistance with walking from room to room (with or without device)? Independent  Stairs: Did the patient need assistance with internal or external stairs (with or without device)? Independent  Functional Cognition: Did the patient need help planning regular tasks such as shopping or remembering to take medications? Needed some help  Home Assistive Devices / Equipment Home Equipment: Walker - 2 wheels, Bedside commode, Shower seat  Prior Device Use: Indicate devices/aids used by the patient prior to current illness,  exacerbation or injury? None of the above  Current Functional Level Cognition  Arousal/Alertness: Awake/alert Overall Cognitive Status: Impaired/Different from baseline Orientation Level: Oriented X4 Following Commands: Follows one step commands  consistently, Follows multi-step commands inconsistently, Follows multi-step commands with increased time Safety/Judgement: Decreased awareness of safety, Decreased awareness of deficits General Comments: leans laterally with no correction to stop it Attention: Focused, Sustained Focused Attention: Appears intact(Vigialnce WNL: 1/1) Sustained Attention: Appears intact(Serial 7s: 3/3) Memory: Impaired Memory Impairment: Storage deficit, Retrieval deficit, Decreased recall of new information(Immediate: 5/5; delayed: 4/5 with cues: 1/1) Awareness: Impaired Awareness Impairment: Intellectual impairment Problem Solving: Impaired Problem Solving Impairment: Verbal complex Executive Function: Reasoning, Sequencing, Organizing Reasoning: Impaired Reasoning Impairment: Verbal complex(Abstraction: 1/2) Sequencing: Impaired Sequencing Impairment: Verbal complex(Difficulty drtawing clock: 2/3) Organizing: Appears intact(Backward digt span: 1/2)    Extremity Assessment (includes Sensation/Coordination)  Upper Extremity Assessment: Defer to OT evaluation LUE Deficits / Details: grossly 3+/5 MMT, impaired coordination and sensation  LUE Sensation: decreased light touch, decreased proprioception LUE Coordination: decreased fine motor, decreased gross motor  Lower Extremity Assessment: LLE deficits/detail LLE Deficits / Details: pt has 2+ to 3- LE strength with L knee control and help to sidestep needed for a couple steps LLE Coordination: decreased fine motor, decreased gross motor    ADLs  Overall ADL's : Needs assistance/impaired Grooming: Minimal assistance, Standing, Moderate assistance Grooming Details (indicate cue type and reason): Min  assist for standing balance, Mod assist for dynamic standing when reaching outside BOS Upper Body Bathing: Minimal assistance, Sitting Lower Body Bathing: Moderate assistance, Sit to/from stand Upper Body Dressing : Minimal assistance, Sitting Lower Body Dressing: Sit to/from stand, Moderate assistance Toilet Transfer: Moderate assistance, +2 for physical assistance, RW, BSC Toilet Transfer Details (indicate cue type and reason): simulated stand pivot transfer with RW, +2 for weight shifting and safety during mobility due to LLE instability Functional mobility during ADLs: Moderate assistance, +2 for physical assistance General ADL Comments: +2 for transfers, mod assist standing balance    Mobility  Overal bed mobility: Needs Assistance Bed Mobility: Supine to Sit Supine to sit: Mod assist Sit to supine: Mod assist General bed mobility comments: mod for lifting up trunk    Transfers  Overall transfer level: Needs assistance Equipment used: Rolling walker (2 wheeled) Transfers: Sit to/from Stand Sit to Stand: Mod assist, From elevated surface General transfer comment: min to mod assist standing at EOB with LLE instability, Lt lean in standing with no awareness    Ambulation / Gait / Stairs / Wheelchair Mobility  Ambulation/Gait Ambulation/Gait assistance: Mod assist, Max assist, +2 physical assistance, +2 safety/equipment Gait Distance (Feet): 5 Feet Assistive device: Rolling walker (2 wheeled) Gait Pattern/deviations: Step-to pattern, Decreased stride length, Decreased stance time - left, Decreased step length - left, Decreased weight shift to right General Gait Details: pt with heavy L lateral bias; cues for weight shifting, sequencing, and L step length; assistance required for balance and guiding RW Gait velocity: reduced Gait velocity interpretation: <1.8 ft/sec, indicate of risk for recurrent falls    Posture / Balance Dynamic Sitting Balance Sitting balance - Comments:  L lateral lean most of the time Balance Overall balance assessment: Needs assistance Sitting-balance support: Feet supported Sitting balance-Leahy Scale: Poor Sitting balance - Comments: L lateral lean most of the time Postural control: Left lateral lean Standing balance support: Single extremity supported, During functional activity, Bilateral upper extremity supported Standing balance-Leahy Scale: Poor Standing balance comment: used walker for RUE and PT supported L elbow to give pt support to step    Special needs/care consideration BiPAP/CPAP : no CPM : no Continuous Drip IV : no Dialysis : no        Days :  no Life Vest : no Oxygen : no Special Bed : no Trach Size :no Wound Vac (area): no      Location : no Skin : no areas of concern                            Bowel mgmt: continent, last BM 08/14/2018 Bladder mgmt: external catheter in place Diabetic mgmt: prediabetic Behavioral consideration : no Chemo/radiation : no   Previous Home Environment (from acute therapy documentation) Living Arrangements: Other relatives, Spouse/significant other  Lives With: Significant other, Family Available Help at Discharge: Family, Available 24 hours/day Type of Home: Other(Comment)(townhouse) Home Layout: Two level, Bed/bath upstairs Home Access: Stairs to enter [employer] of Steps: (pt is unsure) Bathroom Shower/Tub: Chiropodist: Standard Additional Comments: information from OT interview  Discharge Living Setting Plans for Discharge Living Setting: Patient's home, Lives with (comment), Apartment, Other (Comment)(live with gf (Toshia) and cousin Yusef) Type of Home at Discharge: Apartment Discharge Home Layout: Two level Alternate Level Stairs-Rails: Right Alternate Level Stairs-Number of Steps: 16 Discharge Home Access: Stairs to enter Entrance Stairs-Rails: Right Entrance Stairs-Number of Steps: 5 Discharge Bathroom Shower/Tub: Tub/shower unit  Discharge Bathroom Toilet: Standard Discharge Bathroom Accessibility: Yes How Accessible: Accessible via walker Does the patient have any problems obtaining your medications?: No  Social/Family/Support Systems Patient Roles: Partner, Parent, 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 mother Almyra Free): [phone] Anticipated Caregiver's Contact Information: see above Ability/Limitations of Caregiver: Min/Mod A Caregiver Availability: 24/7 Discharge Plan Discussed with Primary Caregiver: Yes Is Caregiver In Agreement with Plan?: Yes Does Caregiver/Family have Issues with Lodging/Transportation while Pt is in Rehab?: No  Goals/Additional Needs Patient/Family Goal for Rehab: PT: Supervision; OT: Supervision/Min A; SLP: Mod I Expected length of stay: 7-10 days Cultural Considerations: NA Dietary Needs: heart healthy, thin liquids Equipment Needs: TBD Pt/Family Agrees to Admission and willing to participate: Yes Program Orientation Provided & Reviewed with Pt/Caregiver Including Roles  & Responsibilities: Yes(pt, his mother, father, girlfriend, and cousin)  Barriers to Discharge: Home environment Child psychotherapist, Insurance for SNF coverage  Barriers to Discharge Comments: steps to enter; bed/full bath upstairs.   Decrease burden of Care through IP rehab admission: Other   Possible need for SNF placement upon discharge: Not anticipated; pt has great social support and good prognosis for further recovery through rehab. Pt has the option to go stay with his mom in a more accessible home if needed at DC but the plan is to return to his apartment if he is able to navigate stairs.   Patient Condition: I have reviewed medical records from [facility], spoken with RN, MD, and patient and family member. I met with patient at the bedside for  inpatient rehabilitation assessment.  Patient will benefit from ongoing PT, OT and SLP, can actively participate in 3 hours of therapy a day 5 days of the week, and can make measurable gains during the admission.  Patient will also benefit from the coordinated team approach during an Inpatient Acute Rehabilitation admission.  The patient will receive intensive therapy as well as Rehabilitation physician, nursing, social worker, and care management interventions.  Due to bladder management, bowel management, safety, skin/wound care, disease management, medication administration, pain management and patient education the patient requires 24 hour a day rehabilitation nursing.  The patient is currently Mod/Max A x2 with mobility and Min to Mod Ax2  for basic ADLs.  Discharge setting and therapy post discharge at home with home health is anticipated.  Patient has agreed to participate in the Acute Inpatient Rehabilitation Program and will admit today.  Preadmission Screen Completed By:  Jhonnie Garner, 08/17/2018 5:14 PM ______________________________________________________________________   Discussed status with Dr. Posey Pronto on 08/17/2018 at 5:14PM and received approval for admission today.  Admission Coordinator:  Jhonnie Garner, OT, time 5:14PM/Date 08/17/2018   Assessment/Plan: Diagnosis: Right thalamic, midbrain, and internal capsule infarct.   1. Does the need for close, 24 hr/day Medical supervision in concert with the patient's rehab needs make it unreasonable for this patient to be served in a less intensive setting? Yes  2. Co-Morbidities requiring supervision/potential complications: large subacute right PCA stroke, HTN, prediabetes, and HLD. 3. Due to safety, disease management and patient education, does the patient require 24 hr/day rehab nursing? Yes 4. Does the patient require coordinated care of a physician, rehab nurse, PT (1-2 hrs/day, 5 days/week), OT (1-2 hrs/day, 5 days/week) and SLP (1-2  hrs/day, 5 days/week) to address physical and functional deficits in the context of the above medical diagnosis(es)? Yes Addressing deficits in the following areas: balance, endurance, locomotion, strength, transferring, bathing, dressing, toileting, cognition, speech and psychosocial support 5. Can the patient actively participate in an intensive therapy program of at least 3 hrs of therapy 5 days a week? Yes 6. The potential for patient to make measurable gains while on inpatient rehab is excellent 7. Anticipated functional outcomes upon discharge from inpatients are: min assist and mod assist PT, min assist and mod assist OT, modified independent and supervision SLP 8. Estimated rehab length of stay to reach the above functional goals is: 17-21 days. 9. Anticipated D/C setting: Home 10. Anticipated post D/C treatments: HH therapy and Home excercise program 11. Overall Rehab/Functional Prognosis: good  MD Signature: Delice Lesch, MD, ABPMR        Revision History Date/Time User Provider Type Action  08/17/2018 5:21 PM Jamse Arn, MD Physician Sign  08/17/2018 5:14 PM Jhonnie Garner, OT Rehab Admission Coordinator Share  View Details Report

## 2018-08-17 NOTE — Progress Notes (Signed)
Physical Therapy Treatment Patient Details Name: Todd Hood MRN: [id number] DOB: 08-23-1975 Today's Date: 08/17/2018    History of Present Illness Pt is a 43 y/o male presenting after falling out of a chair while watching tv, with slurred speech and difficulty opening R eye. MRI reveals "Acute nonhemorrhagic RIGHT thalamic, midbrain, and internal capsule infarcts; Chronic RIGHT occipital and posterior temporal PCA territory infarcts.". PMH: CVA, HTN.     PT Comments    Patient seen for mobility progression. Pt is making progress toward PT goals and tolerated increased mobility well. Pt requires +2 assist for functional transfer and short distance gait training this session. Continue to recommend CIR for further skilled PT services to maximize independence and safety with mobility.     Follow Up Recommendations  CIR     Equipment Recommendations  None recommended by PT    Recommendations for Other Services       Precautions / Restrictions Precautions Precautions: Fall Precaution Comments: R eye ptosis  Restrictions Weight Bearing Restrictions: No    Mobility  Bed Mobility Overal bed mobility: Needs Assistance Bed Mobility: Supine to Sit     Supine to sit: Mod assist     General bed mobility comments: mod for lifting up trunk  Transfers Overall transfer level: Needs assistance Equipment used: Rolling walker (2 wheeled) Transfers: Sit to/from Stand Sit to Stand: Mod assist;From elevated surface         General transfer comment: min to mod assist standing at EOB with LLE instability, Lt lean in standing with no awareness  Ambulation/Gait Ambulation/Gait assistance: Mod assist;Max assist;+2 physical assistance;+2 safety/equipment Gait Distance (Feet): 5 Feet Assistive device: Rolling walker (2 wheeled) Gait Pattern/deviations: Step-to pattern;Decreased stride length;Decreased stance time - left;Decreased step length - left;Decreased weight shift to right Gait  velocity: reduced   General Gait Details: pt with heavy L lateral bias; cues for weight shifting, sequencing, and L step length; assistance required for balance and guiding RW   Stairs             Wheelchair Mobility    Modified Rankin (Stroke Patients Only) Modified Rankin (Stroke Patients Only) Modified Rankin: Moderately severe disability     Balance Overall balance assessment: Needs assistance Sitting-balance support: Feet supported Sitting balance-Leahy Scale: Poor   Postural control: Left lateral lean Standing balance support: Single extremity supported;During functional activity;Bilateral upper extremity supported Standing balance-Leahy Scale: Poor                              Cognition Arousal/Alertness: Awake/alert Behavior During Therapy: Flat affect Overall Cognitive Status: Impaired/Different from baseline Area of Impairment: Following commands;Safety/judgement;Awareness;Problem solving                       Following Commands: Follows one step commands consistently;Follows multi-step commands inconsistently;Follows multi-step commands with increased time Safety/Judgement: Decreased awareness of safety;Decreased awareness of deficits Awareness: Intellectual;Emergent Problem Solving: Difficulty sequencing;Requires verbal cues;Requires tactile cues General Comments: leans laterally with no correction to stop it      Exercises      General Comments        Pertinent Vitals/Pain Pain Assessment: No/denies pain    Home Living                      Prior Function            PT Goals (current goals can now be found in the  care plan section) Acute Rehab PT Goals Patient Stated Goal: get stronger again Progress towards PT goals: Progressing toward goals    Frequency    Min 4X/week      PT Plan Current plan remains appropriate    Co-evaluation PT/OT/SLP Co-Evaluation/Treatment: Yes Reason for Co-Treatment:  For patient/therapist safety;To address functional/ADL transfers;Complexity of the patient's impairments (multi-system involvement);Necessary to address cognition/behavior during functional activity PT goals addressed during session: Mobility/safety with mobility;Balance        AM-PAC PT "6 Clicks" Mobility   Outcome Measure  Help needed turning from your back to your side while in a flat bed without using bedrails?: A Little Help needed moving from lying on your back to sitting on the side of a flat bed without using bedrails?: A Lot Help needed moving to and from a bed to a chair (including a wheelchair)?: A Lot Help needed standing up from a chair using your arms (e.g., wheelchair or bedside chair)?: A Lot Help needed to walk in hospital room?: A Lot Help needed climbing 3-5 steps with a railing? : Total 6 Click Score: 12    End of Session Equipment Utilized During Treatment: Gait belt Activity Tolerance: Patient tolerated treatment well Patient left: with call bell/phone within reach;in chair;with chair alarm set;with family/visitor present Nurse Communication: Mobility status PT Visit Diagnosis: Unsteadiness on feet (R26.81);Muscle weakness (generalized) (M62.81);Difficulty in walking, not elsewhere classified (R26.2);Hemiplegia and hemiparesis Hemiplegia - Right/Left: Left Hemiplegia - dominant/non-dominant: Non-dominant Hemiplegia - caused by: Cerebral infarction     Time: 1444-1520 PT Time Calculation (min) (ACUTE ONLY): 36 min  Charges:  $Gait Training: 8-22 mins                     Todd Hood, PTA Acute Rehabilitation Services Pager: [phone] Office: [phone]     Todd Hood 08/17/2018, 4:31 PM

## 2018-08-17 NOTE — PMR Pre-admission (Signed)
PMR Admission Coordinator Pre-Admission Assessment  Patient: Todd Hood is an 43 y.o., male MRN: [id number] DOB: 12/22/1975 Height: 5' 9"  (175.3 cm) Weight: 91.6 kg  Insurance Information HMO:     PPO:      PCP:      IPA:      80/20:      OTHER:  PRIMARY: Uninsured (self pay)      Policy#:       Subscriber:  CM Name:       Phone#:      Fax#:  Pre-Cert#:       Employer:  Benefits:  Phone #:      Name:  Eff. Date:      Deduct:       Out of Pocket Max:       Life Max:  CIR: Pt is aware of estimated daily cost of care ($3,500). Pt has been in contact with financial counselor, Velora Heckler. Per Crystal she planned on submitting MAD application 8/5.       SNF: Outpatient:      Co-Pay:  Home Health:       Co-Pay:  DME:      Co-Pay:  Providers:  SECONDARY:       Policy#:       Subscriber:  CM Name:       Phone#:      Fax#:  Pre-Cert#:       Employer:  Benefits:  Phone #:      Name:  Eff. Date:     Deduct:       Out of Pocket Max:       Life Max:  CIR:       SNF:  Outpatient:      Co-Pay:  Home Health:       Co-Pay:  DME:      Co-Pay:   Medicaid Application Date:       Case Manager:  Disability Application Date:       Case Worker:   The "Data Collection Information Summary" for patients in Inpatient Rehabilitation Facilities with attached "Privacy Act [place] Records" was provided and verbally reviewed with: N/A  Emergency Contact Information Contact Information    Name Relation Home Work [place] Mother [phone]  [phone]   Simmons,Toshia Significant other   [phone]      Current Medical History  Patient Admitting Diagnosis: Right thalamic, midbrain, and internal capsule infarct.  History of Present Illness: Pt is a 43 yo M with history of large subacute right PCA stroke back in February of this year. He has residual left visual deficits and left sided weakness. Pt also has history of HTN, prediabetes, and HLD. He presented to [facility] on  8/2 with increasing left sided weakness and gait disturbance. An MRI on 8/2 shoed an acute right midbrain, thalamic, and internal capsule infarcts. It also showed chronic right occipital and posterior temporal PCA territory infarcts. His 2D echo was unrevealing and his bubble study was negative for PFO. He is to follow up as an outpatient for an intra-atrial septral aneurysm. Pt has been evaluated by therapies with recommendations for CIR. Pt is to be admitted to CIR on 08/17/2018.   Complete NIHSS TOTAL: 8  Patient's medical record from [facility] has been reviewed by the rehabilitation admission coordinator and physician.  Past Medical History  Past Medical History:  Diagnosis Date  . CVA (cerebral vascular accident) ([place])   . Hypertension     Family  History   family history is not on file.  Prior Rehab/Hospitalizations Has the patient had prior rehab or hospitalizations prior to admission? Yes  Has the patient had major surgery during 100 days prior to admission? Yes   Current Medications  Current Facility-Administered Medications:  .  acetaminophen (TYLENOL) tablet 650 mg, 650 mg, Oral, Q4H PRN, 650 mg at 08/14/18 1824 **OR** acetaminophen (TYLENOL) solution 650 mg, 650 mg, Per Tube, Q4H PRN **OR** acetaminophen (TYLENOL) suppository 650 mg, 650 mg, Rectal, Q4H PRN, Lelon Perla, MD .  aspirin suppository 300 mg, 300 mg, Rectal, Daily **OR** aspirin tablet 325 mg, 325 mg, Oral, Daily, Lelon Perla, MD, 325 mg at 08/17/18 1008 .  atorvastatin (LIPITOR) tablet 80 mg, 80 mg, Oral, QHS, Lelon Perla, MD, 80 mg at 08/16/18 2209 .  clopidogrel (PLAVIX) tablet 75 mg, 75 mg, Oral, Daily, Lelon Perla, MD, 75 mg at 08/17/18 1008 .  enoxaparin (LOVENOX) injection 40 mg, 40 mg, Subcutaneous, Q24H, Crenshaw, Denice Bors, MD, 40 mg at 08/16/18 2209 .  multivitamin with minerals tablet 1 tablet, 1 tablet, Oral, Q breakfast, Crenshaw, Denice Bors, MD, 1 tablet at  08/17/18 1008 .  polyethylene glycol (MIRALAX / GLYCOLAX) packet 17 g, 17 g, Oral, Daily PRN, Debbe Odea, MD, 17 g at 08/17/18 1702 .  senna-docusate (Senokot-S) tablet 1 tablet, 1 tablet, Oral, QHS, Lelon Perla, MD, 1 tablet at 08/16/18 2209  Patients Current Diet:  Diet Order            Diet Heart Room service appropriate? Yes; Fluid consistency: Thin  Diet effective now              Precautions / Restrictions Precautions Precautions: Fall Precaution Comments: R eye ptosis  Restrictions Weight Bearing Restrictions: No Other Position/Activity Restrictions: Can see better to L side as R eye is closed   Has the patient had 2 or more falls or a fall with injury in the past year? Yes  Prior Activity Level Community (5-7x/wk): very active PTA; worked at [employer] for state of [place]. Lives with gf and cousin. did not drive due to prior CVA vision issues  Prior Functional Level Self Care: Did the patient need help bathing, dressing, using the toilet or eating? Independent  Indoor Mobility: Did the patient need assistance with walking from room to room (with or without device)? Independent  Stairs: Did the patient need assistance with internal or external stairs (with or without device)? Independent  Functional Cognition: Did the patient need help planning regular tasks such as shopping or remembering to take medications? Needed some help  Home Assistive Devices / Equipment Home Equipment: Walker - 2 wheels, Bedside commode, Shower seat  Prior Device Use: Indicate devices/aids used by the patient prior to current illness, exacerbation or injury? None of the above  Current Functional Level Cognition  Arousal/Alertness: Awake/alert Overall Cognitive Status: Impaired/Different from baseline Orientation Level: Oriented X4 Following Commands: Follows one step commands consistently, Follows multi-step commands inconsistently, Follows multi-step commands with  increased time Safety/Judgement: Decreased awareness of safety, Decreased awareness of deficits General Comments: leans laterally with no correction to stop it Attention: Focused, Sustained Focused Attention: Appears intact(Vigialnce WNL: 1/1) Sustained Attention: Appears intact(Serial 7s: 3/3) Memory: Impaired Memory Impairment: Storage deficit, Retrieval deficit, Decreased recall of new information(Immediate: 5/5; delayed: 4/5 with cues: 1/1) Awareness: Impaired Awareness Impairment: Intellectual impairment Problem Solving: Impaired Problem Solving Impairment: Verbal complex Executive Function: Reasoning, Sequencing, Organizing Reasoning: Impaired Reasoning Impairment: Verbal complex(Abstraction:  1/2) Sequencing: Impaired Sequencing Impairment: Verbal complex(Difficulty drtawing clock: 2/3) Organizing: Appears intact(Backward digt span: 1/2)    Extremity Assessment (includes Sensation/Coordination)  Upper Extremity Assessment: Defer to OT evaluation LUE Deficits / Details: grossly 3+/5 MMT, impaired coordination and sensation  LUE Sensation: decreased light touch, decreased proprioception LUE Coordination: decreased fine motor, decreased gross motor  Lower Extremity Assessment: LLE deficits/detail LLE Deficits / Details: pt has 2+ to 3- LE strength with L knee control and help to sidestep needed for a couple steps LLE Coordination: decreased fine motor, decreased gross motor    ADLs  Overall ADL's : Needs assistance/impaired Grooming: Minimal assistance, Standing, Moderate assistance Grooming Details (indicate cue type and reason): Min assist for standing balance, Mod assist for dynamic standing when reaching outside BOS Upper Body Bathing: Minimal assistance, Sitting Lower Body Bathing: Moderate assistance, Sit to/from stand Upper Body Dressing : Minimal assistance, Sitting Lower Body Dressing: Sit to/from stand, Moderate assistance Toilet Transfer: Moderate assistance, +2 for  physical assistance, RW, BSC Toilet Transfer Details (indicate cue type and reason): simulated stand pivot transfer with RW, +2 for weight shifting and safety during mobility due to LLE instability Functional mobility during ADLs: Moderate assistance, +2 for physical assistance General ADL Comments: +2 for transfers, mod assist standing balance    Mobility  Overal bed mobility: Needs Assistance Bed Mobility: Supine to Sit Supine to sit: Mod assist Sit to supine: Mod assist General bed mobility comments: mod for lifting up trunk    Transfers  Overall transfer level: Needs assistance Equipment used: Rolling walker (2 wheeled) Transfers: Sit to/from Stand Sit to Stand: Mod assist, From elevated surface General transfer comment: min to mod assist standing at EOB with LLE instability, Lt lean in standing with no awareness    Ambulation / Gait / Stairs / Wheelchair Mobility  Ambulation/Gait Ambulation/Gait assistance: Mod assist, Max assist, +2 physical assistance, +2 safety/equipment Gait Distance (Feet): 5 Feet Assistive device: Rolling walker (2 wheeled) Gait Pattern/deviations: Step-to pattern, Decreased stride length, Decreased stance time - left, Decreased step length - left, Decreased weight shift to right General Gait Details: pt with heavy L lateral bias; cues for weight shifting, sequencing, and L step length; assistance required for balance and guiding RW Gait velocity: reduced Gait velocity interpretation: <1.8 ft/sec, indicate of risk for recurrent falls    Posture / Balance Dynamic Sitting Balance Sitting balance - Comments: L lateral lean most of the time Balance Overall balance assessment: Needs assistance Sitting-balance support: Feet supported Sitting balance-Leahy Scale: Poor Sitting balance - Comments: L lateral lean most of the time Postural control: Left lateral lean Standing balance support: Single extremity supported, During functional activity, Bilateral upper  extremity supported Standing balance-Leahy Scale: Poor Standing balance comment: used walker for RUE and PT supported L elbow to give pt support to step    Special needs/care consideration BiPAP/CPAP : no CPM : no Continuous Drip IV : no Dialysis : no        Days : no Life Vest : no Oxygen : no Special Bed : no Trach Size :no Wound Vac (area): no      Location : no Skin : no areas of concern                            Bowel mgmt: continent, last BM 08/14/2018 Bladder mgmt: external catheter in place Diabetic mgmt: prediabetic Behavioral consideration : no Chemo/radiation : no   Previous Home Environment (from  acute therapy documentation) Living Arrangements: Other relatives, Spouse/significant other  Lives With: Significant other, Family Available Help at Discharge: Family, Available 24 hours/day Type of Home: Other(Comment)(townhouse) Home Layout: Two level, Bed/bath upstairs Home Access: Stairs to enter [employer] of Steps: (pt is unsure) Bathroom Shower/Tub: Chiropodist: Standard Additional Comments: information from OT interview  Discharge Living Setting Plans for Discharge Living Setting: Patient's home, Lives with (comment), Apartment, Other (Comment)(live with gf (Toshia) and cousin Yusef) Type of Home at Discharge: Apartment Discharge Home Layout: Two level Alternate Level Stairs-Rails: Right Alternate Level Stairs-Number of Steps: 16 Discharge Home Access: Stairs to enter Entrance Stairs-Rails: Right Entrance Stairs-Number of Steps: 5 Discharge Bathroom Shower/Tub: Tub/shower unit Discharge Bathroom Toilet: Standard Discharge Bathroom Accessibility: Yes How Accessible: Accessible via walker Does the patient have any problems obtaining your medications?: No  Social/Family/Support Systems Patient Roles: Partner, Parent, 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 mother Almyra Free): [phone] Anticipated Caregiver's Contact Information: see above Ability/Limitations of Caregiver: Min/Mod A Caregiver Availability: 24/7 Discharge Plan Discussed with Primary Caregiver: Yes Is Caregiver In Agreement with Plan?: Yes Does Caregiver/Family have Issues with Lodging/Transportation while Pt is in Rehab?: No  Goals/Additional Needs Patient/Family Goal for Rehab: PT: Supervision; OT: Supervision/Min A; SLP: Mod I Expected length of stay: 7-10 days Cultural Considerations: NA Dietary Needs: heart healthy, thin liquids Equipment Needs: TBD Pt/Family Agrees to Admission and willing to participate: Yes Program Orientation Provided & Reviewed with Pt/Caregiver Including Roles  & Responsibilities: Yes(pt, his mother, father, girlfriend, and cousin)  Barriers to Discharge: Home environment Child psychotherapist, Insurance for SNF coverage  Barriers to Discharge Comments: steps to enter; bed/full bath upstairs.   Decrease burden of Care through IP rehab admission: Other   Possible need for SNF placement upon discharge: Not anticipated; pt has great social support and good prognosis for further recovery through rehab. Pt has the option to go stay with his mom in a more accessible home if needed at DC but the plan is to return to his apartment if he is able to navigate stairs.   Patient Condition: I have reviewed medical records from [facility], spoken with RN, MD, and patient and family member. I met with patient at the bedside for inpatient rehabilitation assessment.  Patient will benefit from ongoing PT, OT and SLP, can actively participate in 3 hours of therapy a day 5 days of the week, and can make measurable gains during the admission.  Patient will also benefit from the coordinated team approach during an Inpatient Acute Rehabilitation admission.  The patient will receive intensive  therapy as well as Rehabilitation physician, nursing, social worker, and care management interventions.  Due to bladder management, bowel management, safety, skin/wound care, disease management, medication administration, pain management and patient education the patient requires 24 hour a day rehabilitation nursing.  The patient is currently Mod/Max A x2 with mobility and Min to Mod Ax2 for basic ADLs.  Discharge setting and therapy post discharge at home with home health is anticipated.  Patient has agreed to participate in the Acute Inpatient Rehabilitation Program and will admit today.  Preadmission Screen Completed By:  Jhonnie Garner, 08/17/2018 5:14 PM ______________________________________________________________________   Discussed status with Dr. Posey Pronto on 08/17/2018 at 5:14PM and received approval for admission today.  Admission Coordinator:  Jhonnie Garner, OT, time 5:14PM/Date 08/17/2018   Assessment/Plan: Diagnosis: Right thalamic, midbrain, and internal capsule infarct.   1. Does the  need for close, 24 hr/day Medical supervision in concert with the patient's rehab needs make it unreasonable for this patient to be served in a less intensive setting? Yes  2. Co-Morbidities requiring supervision/potential complications: large subacute right PCA stroke, HTN, prediabetes, and HLD. 3. Due to safety, disease management and patient education, does the patient require 24 hr/day rehab nursing? Yes 4. Does the patient require coordinated care of a physician, rehab nurse, PT (1-2 hrs/day, 5 days/week), OT (1-2 hrs/day, 5 days/week) and SLP (1-2 hrs/day, 5 days/week) to address physical and functional deficits in the context of the above medical diagnosis(es)? Yes Addressing deficits in the following areas: balance, endurance, locomotion, strength, transferring, bathing, dressing, toileting, cognition, speech and psychosocial support 5. Can the patient actively participate in an intensive therapy program of at  least 3 hrs of therapy 5 days a week? Yes 6. The potential for patient to make measurable gains while on inpatient rehab is excellent 7. Anticipated functional outcomes upon discharge from inpatients are: min assist and mod assist PT, min assist and mod assist OT, modified independent and supervision SLP 8. Estimated rehab length of stay to reach the above functional goals is: 17-21 days. 9. Anticipated D/C setting: Home 10. Anticipated post D/C treatments: HH therapy and Home excercise program 11. Overall Rehab/Functional Prognosis: good  MD Signature: Delice Lesch, MD, ABPMR

## 2018-08-17 NOTE — Progress Notes (Signed)
Occupational Therapy Treatment Patient Details Name: Todd Hood MRN: [id number] DOB: 06/20/1975 Today's Date: 08/17/2018    History of present illness Pt is a 43 y/o male presenting after falling out of a chair while watching tv, with slurred speech and difficulty opening R eye. MRI reveals "Acute nonhemorrhagic RIGHT thalamic, midbrain, and internal capsule infarcts; Chronic RIGHT occipital and posterior temporal PCA territory infarcts.". PMH: CVA, HTN.    OT comments  Pt progressing towards goals with functional ambulation to sink with Mod assist +2 for weight shifting and advancement of LLE.  Pt with Lt lean in standing with no awareness, requiring multimodal cueing to correct.  Engaged in grooming tasks in standing with mod assist for weight shift to maintain midline posture.  Pt demonstrating good compensation with visual scanning to obtain items on sink.  Continue to recommend CIR for intensive rehab prior to d/c home.    Follow Up Recommendations  CIR;Supervision/Assistance - 24 hour    Equipment Recommendations  3 in 1 bedside commode       Precautions / Restrictions Precautions Precautions: Fall(Simultaneous filing. User may not have seen previous data.) Precaution Comments: L knee buckles with standing, R eye closed unless manually opened(Simultaneous filing. User may not have seen previous data.) Required Braces or Orthoses: (P) (none ordered but may need KAFO if LLE does not progress) Restrictions Weight Bearing Restrictions: (P) No Other Position/Activity Restrictions: (P) Can see better to L side as R eye is closed       Mobility Bed Mobility Overal bed mobility: Needs Assistance Bed Mobility: Supine to Sit     Supine to sit: Mod assist Sit to supine: (P) Mod assist   General bed mobility comments: mod for lifting up trunk  Transfers Overall transfer level: Needs assistance Equipment used: Rolling walker (2 wheeled) Transfers: Sit to/from Stand Sit to Stand:  Mod assist;From elevated surface         General transfer comment: min to mod assist standing at EOB with LLE instability, Lt lean in standing with no awareness    Balance Overall balance assessment: (P) Needs assistance Sitting-balance support: (P) Feet supported Sitting balance-Leahy Scale: (P) Poor Sitting balance - Comments: (P) L lateral lean most of the time Postural control: (P) Left lateral lean Standing balance support: (P) Single extremity supported;During functional activity;Bilateral upper extremity supported Standing balance-Leahy Scale: (P) Poor Standing balance comment: (P) used walker for RUE and PT supported L elbow to give pt support to step                           ADL either performed or assessed with clinical judgement   ADL Overall ADL's : Needs assistance/impaired     Grooming: Minimal assistance;Standing;Moderate assistance Grooming Details (indicate cue type and reason): Min assist for standing balance, Mod assist for dynamic standing when reaching outside BOS                 Toilet Transfer: Moderate assistance;+2 for physical assistance;RW;BSC Toilet Transfer Details (indicate cue type and reason): simulated stand pivot transfer with RW, +2 for weight shifting and safety during mobility due to LLE instability         Functional mobility during ADLs: Moderate assistance;+2 for physical assistance General ADL Comments: +2 for transfers, mod assist standing balance     Vision Patient Visual Report: Other (comment)(Lt eye from previous CVA, Rt eye from injury d/t fall) Additional Comments: continue to assess during functional tasks and  mobility.  Pt able to compensate well with self-care tasks at sink and short distance ambulation          Cognition Arousal/Alertness: Awake/alert Behavior During Therapy: Flat affect Overall Cognitive Status: Impaired/Different from baseline Area of Impairment: Following  commands;Safety/judgement;Awareness;Problem solving                       Following Commands: Follows one step commands consistently;Follows multi-step commands inconsistently;Follows multi-step commands with increased time Safety/Judgement: Decreased awareness of safety;Decreased awareness of deficits Awareness: Intellectual;Emergent Problem Solving: Difficulty sequencing;Requires verbal cues;Requires tactile cues General Comments: leans laterally with no correction to stop it        Exercises Exercises: (P) Other exercises(LLE is 2+ to 3- strength)            Frequency  Min 2X/week        Progress Toward Goals  OT Goals(current goals can now be found in the care plan section)  Progress towards OT goals: Progressing toward goals  Acute Rehab OT Goals Patient Stated Goal: get stronger again OT Goal Formulation: With patient Time For Goal Achievement: 08/29/18 Potential to Achieve Goals: Good  Plan Discharge plan remains appropriate       AM-PAC OT "6 Clicks" Daily Activity     Outcome Measure   Help from another person eating meals?: A Little Help from another person taking care of personal grooming?: A Little Help from another person toileting, which includes using toliet, bedpan, or urinal?: A Lot Help from another person bathing (including washing, rinsing, drying)?: A Lot Help from another person to put on and taking off regular upper body clothing?: A Little Help from another person to put on and taking off regular lower body clothing?: A Lot 6 Click Score: 15    End of Session Equipment Utilized During Treatment: Gait belt;Rolling walker  OT Visit Diagnosis: Other abnormalities of gait and mobility (R26.89);Muscle weakness (generalized) (M62.81);Other symptoms and signs involving cognitive function;Hemiplegia and hemiparesis Hemiplegia - Right/Left: Left Hemiplegia - dominant/non-dominant: Non-Dominant Hemiplegia - caused by: Cerebral infarction    Activity Tolerance Patient tolerated treatment well   Patient Left in chair;with call bell/phone within reach;with chair alarm set;with family/visitor present   Nurse Communication Mobility status        Time: 9030-0923 OT Time Calculation (min): 36 min  Charges: OT Treatments $Self Care/Home Management : 8-22 mins    Simonne Come, [phone] 08/17/2018, 4:29 PM

## 2018-08-17 NOTE — Progress Notes (Signed)
patient arrived from [facility] via bed, no complaints of pain at this time

## 2018-08-17 NOTE — Discharge Summary (Signed)
Physician Discharge Summary  Niraj Kudrna ZOX:[id number] DOB: Jun 09, 1975 DOA: 08/14/2018  PCP: Patient, No Pcp Per  Admit date: 08/14/2018 Discharge date: 08/17/2018  Admitted From: home Disposition:  CIR   Recommendations for Outpatient Follow-up:  1. F/u on A1c 2. He needs to f/u with Dr Pearlean Brownie as outpt in 4 wks.  Discharge Condition:  stable   CODE STATUS:  Full code   Diet recommendation:  Heart healthy, diabetic, low sodium Consultations:  neuro    Discharge Diagnoses:  Principal Problem:   Acute ischemic stroke (HCC) Active Problems:   HTN (hypertension)   Prediabetes   Marijuana use      Brief Summary: Todd Hood is a 43 y/o male with h/o Large subacute right PCA stroke treated at [facility] 03/01/18/2020 with residual left eye visual impairment and left sided weakness, HTN, prediabetes, HLD who presents with increasing left sided weakness and gait disturbance.   MRI on 8/2 > acute right midbrain, thalamic, internal capsule infarcts. Chronic RIGHT occipital and posterior temporal PCA territory infarcts.  Hospital Course:  Principal Problem:   Acute ischemic stroke - h/o prior infarct as mentioned above - now has worsening of prior left sided weakness.  - TCD with bubble study negative for PFO - 2 D ECHO unrevealing - TEE showed no cardiac source or PFO- found to have a septal aneurysm - he has declined a loop recorder due to cost- he had a 30 day event monitor previously which did not find any arrythmias  - A1c 6.2 - LDL 84 - per neuro, cont Aspirin and Plavix for 3 wks followed by Plavix alone - cont statin - awaiting CIR to confirm if they will accept him for rehab  Active Problems:   HTN (hypertension) - currently allowing permissive HTN and Amlodipine and Losartan on hold  Hypokalemia - replaced- recheck tomorrow    Prediabetes - A1c 6.2 - needs dietary changes and weight loss  Intra-atrial septal aneurysm - f/u as outpt    Marijuana  use - advised to quit      Discharge Exam: Vitals:   08/17/18 0825 08/17/18 1209  BP: (!) 145/101 (!) 149/104  Pulse: 78 77  Resp: 15 18  Temp: 98.8 F (37.1 C) 98.2 F (36.8 C)  SpO2: 100% 100%   Vitals:   08/17/18 0015 08/17/18 0428 08/17/18 0825 08/17/18 1209  BP: (!) 150/93 (!) 148/97 (!) 145/101 (!) 149/104  Pulse: 74 65 78 77  Resp: 20 (!) 22 15 18   Temp: 98.5 F (36.9 C) 98.4 F (36.9 C) 98.8 F (37.1 C) 98.2 F (36.8 C)  TempSrc: Oral Oral Axillary Axillary  SpO2: 100% 100% 100% 100%  Weight:      Height:        General exam: Appears comfortable  HEENT: PERRLA, oral mucosa moist, no sclera icterus or thrush Respiratory system: Clear to auscultation. Respiratory effort normal. Cardiovascular system: S1 & S2 heard, RRR.   Gastrointestinal system: Abdomen soft, non-tender, nondistended. Normal bowel sounds. Central nervous system: Alert and oriented. Left arm 0/5 strength- left leg 2/5. Unable to open right eye.  Extremities: No cyanosis, clubbing or edema Skin: No rashes or ulcers Psychiatry:  Mood & affect appropriate.    Discharge Instructions  Discharge Instructions    Increase activity slowly   Complete by: As directed      Allergies as of 08/17/2018   No Known Allergies     Medication List    STOP taking these medications   aspirin EC 81  MG tablet Replaced by: aspirin 325 MG tablet     TAKE these medications   acetaminophen 650 MG CR tablet Commonly known as: TYLENOL Take 650 mg by mouth every 8 (eight) hours as needed for pain.   amLODipine 10 MG tablet Commonly known as: NORVASC Take 10 mg by mouth at bedtime.   aspirin 325 MG tablet Take 1 tablet (325 mg total) by mouth daily. Start taking on: August 18, 2018 Replaces: aspirin EC 81 MG tablet   atorvastatin 80 MG tablet Commonly known as: LIPITOR Take 80 mg by mouth at bedtime.   Centrum Silver 50+Men Tabs Take 1 tablet by mouth daily with breakfast.   clopidogrel 75 MG  tablet Commonly known as: PLAVIX Take 75 mg by mouth at bedtime.   gabapentin 300 MG capsule Commonly known as: NEURONTIN Take 300 mg by mouth 2 (two) times daily. Morning and bedtime   losartan 50 MG tablet Commonly known as: COZAAR Take 50 mg by mouth every morning.       No Known Allergies   Procedures/Studies:  TTE  1. The left ventricle has a visually estimated ejection fraction of 55%. The cavity size was normal. There is mildly increased left ventricular wall thickness. Left ventricular diastolic Doppler parameters are consistent with pseudonormalization. No  evidence of left ventricular regional wall motion abnormalities. 2. The right ventricle has normal systolic function. The cavity was normal. There is no increase in right ventricular wall thickness. 3. Trivial pericardial effusion is present. 4. No evidence of mitral valve stenosis. No mitral regurgitation. 5. The aortic valve is tricuspid. No stenosis of the aortic valve. 6. The aorta is normal in size and structure. 7. There is mild dilatation of the aortic root measuring 37 mm. 8. The IVC was normal in size. No complete TR doppler jet so unable to estimate PA systolic pressure.   TEE 1. The left ventricle has normal systolic function, with an ejection fraction of 55-60%. No evidence of left ventricular regional wall motion abnormalities. 2. The right ventricle has normal systolc function. The cavity was normal. 3. The aortic valve is tricuspid No stenosis of the aortic valve. 4. There is evidence of mild plaque in the descending aorta. 5. The interatrial septum is aneurysmal. 6. Normal LV systolic function; atrial septal aneurysm; negative saline microcavitation study.  Ct Code Stroke Cta Head W/wo Contrast  Result Date: 08/14/2018 CLINICAL DATA:  43 year old male code stroke. Left arm weakness, altered mental status. Clinically, a tip of the basilar occlusion is being question. EXAM: CT  ANGIOGRAPHY HEAD AND NECK CT PERFUSION BRAIN TECHNIQUE: Multidetector CT imaging of the head and neck was performed using the standard protocol during bolus administration of intravenous contrast. Multiplanar CT image reconstructions and MIPs were obtained to evaluate the vascular anatomy. Carotid stenosis measurements (when applicable) are obtained utilizing NASCET criteria, using the distal internal carotid diameter as the denominator. Multiphase CT imaging of the brain was performed following IV bolus contrast injection. Subsequent parametric perfusion maps were calculated using RAPID software. CONTRAST:  90mL OMNIPAQUE IOHEXOL 350 MG/ML SOLN COMPARISON:  Plain head CT 1144 hours today. FINDINGS: CT Brain Perfusion Findings: ASPECTS: 10 CBF (<30%) Volume: None Perfusion (Tmax>6.0s) volume: None Mismatch Volume: Not applicable Infarction Location:Not applicable CTA NECK Skeleton: No acute osseous abnormality identified. Scoliosis. Reversal of cervical lordosis. Upper chest: Negative lung apices and superior mediastinum. Other neck: Negative. Aortic arch: 3 vessel arch configuration. No arch atherosclerosis or great vessel origin stenosis. Right carotid system: Negative. Left carotid  system: The left CCA and left ICA are somewhat smaller than the right side, but otherwise negative. The left carotid bifurcation is negative. Vertebral arteries: Normal proximal right subclavian artery and right vertebral artery origin. Normal right vertebral artery to the skull base. Normal proximal left subclavian artery and left vertebral artery origin. The left vertebral artery appears mildly non dominant but otherwise normal to the skull base. CTA HEAD Posterior circulation: Dominant distal right vertebral artery. Patent PICA origins. No distal vertebral stenosis. Mildly dolichoectatic distal vertebral arteries and vertebrobasilar junction. Dolichoectatic basilar artery (5 millimeters diameter). The basilar tip is mildly tortuous.  The right PCA is occluded at its origin (series 12, image 22, with chronic encephalomalacia noted throughout the right PCA territory. Both SCA and the left PCA origins are patent. There is no right PCA enhancement. The left PCA is mildly irregular at its origin and in the P2 segment, but there is distal left PCA enhancement. Anterior circulation: Both ICA siphons are patent. The left appears non dominant, and the left ACA A1 segment is absent. The right A1 is dominant. The left ICA siphon is mildly irregular without significant stenosis. The right ICA siphon is also mildly irregular, but without discrete plaque. No right ICA siphon stenosis. Normal right ICA terminus, right MCA and dominant right A1 origin. The anterior communicating artery and bilateral ACA branches are within normal limits. Left MCA M1 segment and bifurcation are patent without stenosis. Left MCA branches are within normal limits. Right MCA M1 and bifurcation are patent without stenosis. Right MCA branches are within normal limits. Venous sinuses: Early contrast timing, not evaluated. Anatomic variants: Mildly dominant right vertebral artery. Dominant right and absent left ACA A1 segments with associated dominance of the right carotid. Review of the MIP images confirms the above findings IMPRESSION: 1. Negative for emergent large vessel occlusion, and no ischemia or core infarct detected by CT Perfusion. 2. Positive for chronic Right PCA occlusion at its origin, corresponding to chronic right PCA territory encephalomalacia. 3. Ectatic basilar artery. Mild irregularity of both ICA siphons and the Left PCA without significant stenosis. 4. No carotid or vertebral artery abnormality in the neck. These results were communicated to Dr. Otelia LimesLindzen at 12:20 pmon 08/14/2018 by text page via the [facility] messaging system. Electronically Signed   By: Odessa FlemingH  Hall M.D.   On: 08/14/2018 12:20   Ct Head Wo Contrast  Result Date: 08/16/2018 CLINICAL DATA:  Stroke,  follow-up, headache EXAM: CT HEAD WITHOUT CONTRAST TECHNIQUE: Contiguous axial images were obtained from the base of the skull through the vertex without intravenous contrast. COMPARISON:  08/14/2018 CT head and MR brain exams FINDINGS: Brain: Normal ventricular morphology. No midline shift. Old RIGHT occipital infarct. Evolving subacute infarct involving the RIGHT thalamus and midbrain. No hemorrhagic transformation. No new areas of infarction, intracranial hemorrhage, or mass lesion. Vascular: No definite hyperdense vessels Skull: Intact Sinuses/Orbits: Clear Other: N/A IMPRESSION: Old infarct RIGHT occipital lobe. Evolving subacute infarct RIGHT thalamus and midbrain. No new intracranial abnormalities. Electronically Signed   By: Ulyses SouthwardMark  Boles M.D.   On: 08/16/2018 15:55   Ct Code Stroke Cta Neck W/wo Contrast  Result Date: 08/14/2018 CLINICAL DATA:  43 year old male code stroke. Left arm weakness, altered mental status. Clinically, a tip of the basilar occlusion is being question. EXAM: CT ANGIOGRAPHY HEAD AND NECK CT PERFUSION BRAIN TECHNIQUE: Multidetector CT imaging of the head and neck was performed using the standard protocol during bolus administration of intravenous contrast. Multiplanar CT image reconstructions and MIPs  were obtained to evaluate the vascular anatomy. Carotid stenosis measurements (when applicable) are obtained utilizing NASCET criteria, using the distal internal carotid diameter as the denominator. Multiphase CT imaging of the brain was performed following IV bolus contrast injection. Subsequent parametric perfusion maps were calculated using RAPID software. CONTRAST:  90mL OMNIPAQUE IOHEXOL 350 MG/ML SOLN COMPARISON:  Plain head CT 1144 hours today. FINDINGS: CT Brain Perfusion Findings: ASPECTS: 10 CBF (<30%) Volume: None Perfusion (Tmax>6.0s) volume: None Mismatch Volume: Not applicable Infarction Location:Not applicable CTA NECK Skeleton: No acute osseous abnormality identified.  Scoliosis. Reversal of cervical lordosis. Upper chest: Negative lung apices and superior mediastinum. Other neck: Negative. Aortic arch: 3 vessel arch configuration. No arch atherosclerosis or great vessel origin stenosis. Right carotid system: Negative. Left carotid system: The left CCA and left ICA are somewhat smaller than the right side, but otherwise negative. The left carotid bifurcation is negative. Vertebral arteries: Normal proximal right subclavian artery and right vertebral artery origin. Normal right vertebral artery to the skull base. Normal proximal left subclavian artery and left vertebral artery origin. The left vertebral artery appears mildly non dominant but otherwise normal to the skull base. CTA HEAD Posterior circulation: Dominant distal right vertebral artery. Patent PICA origins. No distal vertebral stenosis. Mildly dolichoectatic distal vertebral arteries and vertebrobasilar junction. Dolichoectatic basilar artery (5 millimeters diameter). The basilar tip is mildly tortuous. The right PCA is occluded at its origin (series 12, image 22, with chronic encephalomalacia noted throughout the right PCA territory. Both SCA and the left PCA origins are patent. There is no right PCA enhancement. The left PCA is mildly irregular at its origin and in the P2 segment, but there is distal left PCA enhancement. Anterior circulation: Both ICA siphons are patent. The left appears non dominant, and the left ACA A1 segment is absent. The right A1 is dominant. The left ICA siphon is mildly irregular without significant stenosis. The right ICA siphon is also mildly irregular, but without discrete plaque. No right ICA siphon stenosis. Normal right ICA terminus, right MCA and dominant right A1 origin. The anterior communicating artery and bilateral ACA branches are within normal limits. Left MCA M1 segment and bifurcation are patent without stenosis. Left MCA branches are within normal limits. Right MCA M1 and  bifurcation are patent without stenosis. Right MCA branches are within normal limits. Venous sinuses: Early contrast timing, not evaluated. Anatomic variants: Mildly dominant right vertebral artery. Dominant right and absent left ACA A1 segments with associated dominance of the right carotid. Review of the MIP images confirms the above findings IMPRESSION: 1. Negative for emergent large vessel occlusion, and no ischemia or core infarct detected by CT Perfusion. 2. Positive for chronic Right PCA occlusion at its origin, corresponding to chronic right PCA territory encephalomalacia. 3. Ectatic basilar artery. Mild irregularity of both ICA siphons and the Left PCA without significant stenosis. 4. No carotid or vertebral artery abnormality in the neck. These results were communicated to Dr. Otelia Limes at 12:20 pmon 08/14/2018 by text page via the [facility] messaging system. Electronically Signed   By: Odessa Fleming M.D.   On: 08/14/2018 12:20   Mr Brain Wo Contrast  Result Date: 08/14/2018 CLINICAL DATA:  Dizziness which began yesterday has increased this morning. Imbalance. Recent stroke February 2020. Difficulty opening RIGHT eye. Recently stopped taking Plavix in preparation for oral surgery. EXAM: MRI HEAD WITHOUT CONTRAST TECHNIQUE: Multiplanar, multiecho pulse sequences of the brain and surrounding structures were obtained without intravenous contrast. COMPARISON:  Code stroke CT and  CTA head neck earlier today. FINDINGS: Brain: Pleomorphic contiguous area(s) of restricted diffusion, corresponding low ADC, affect the RIGHT cerebral peduncle, RIGHT paramedian midbrain extending to the aqueduct, medial RIGHT thalamus, as well as posterior limb and genu internal capsule, also on the RIGHT consistent with acute nonhemorrhagic infarction. Large area of encephalomalacia, representing a chronic PCA infarct affects the RIGHT occipital lobe and RIGHT posterior temporal lobe. No other areas of chronic infarction. Normal for age  cerebral volume. T2 and FLAIR hyperintensities in the white matter, likely small vessel disease. Hemosiderin deposition in the chronic RIGHT occipital infarct, with additional punctate areas of susceptibility LEFT frontal white matter. Vascular: Flow voids are maintained. Marked dolichoectasia, likely hypertensive in origin. Skull and upper cervical spine: Normal marrow signal. Sinuses/Orbits: Negative. Other: None. IMPRESSION: Acute nonhemorrhagic RIGHT thalamic, midbrain, and internal capsule infarcts. Chronic RIGHT occipital and posterior temporal PCA territory infarcts. Mild small vessel disease. Electronically Signed   By: Elsie Stain M.D.   On: 08/14/2018 15:59   Ct Code Stroke Cta Cerebral Perfusion W/wo Contrast  Result Date: 08/14/2018 CLINICAL DATA:  43 year old male code stroke. Left arm weakness, altered mental status. Clinically, a tip of the basilar occlusion is being question. EXAM: CT ANGIOGRAPHY HEAD AND NECK CT PERFUSION BRAIN TECHNIQUE: Multidetector CT imaging of the head and neck was performed using the standard protocol during bolus administration of intravenous contrast. Multiplanar CT image reconstructions and MIPs were obtained to evaluate the vascular anatomy. Carotid stenosis measurements (when applicable) are obtained utilizing NASCET criteria, using the distal internal carotid diameter as the denominator. Multiphase CT imaging of the brain was performed following IV bolus contrast injection. Subsequent parametric perfusion maps were calculated using RAPID software. CONTRAST:  90mL OMNIPAQUE IOHEXOL 350 MG/ML SOLN COMPARISON:  Plain head CT 1144 hours today. FINDINGS: CT Brain Perfusion Findings: ASPECTS: 10 CBF (<30%) Volume: None Perfusion (Tmax>6.0s) volume: None Mismatch Volume: Not applicable Infarction Location:Not applicable CTA NECK Skeleton: No acute osseous abnormality identified. Scoliosis. Reversal of cervical lordosis. Upper chest: Negative lung apices and superior  mediastinum. Other neck: Negative. Aortic arch: 3 vessel arch configuration. No arch atherosclerosis or great vessel origin stenosis. Right carotid system: Negative. Left carotid system: The left CCA and left ICA are somewhat smaller than the right side, but otherwise negative. The left carotid bifurcation is negative. Vertebral arteries: Normal proximal right subclavian artery and right vertebral artery origin. Normal right vertebral artery to the skull base. Normal proximal left subclavian artery and left vertebral artery origin. The left vertebral artery appears mildly non dominant but otherwise normal to the skull base. CTA HEAD Posterior circulation: Dominant distal right vertebral artery. Patent PICA origins. No distal vertebral stenosis. Mildly dolichoectatic distal vertebral arteries and vertebrobasilar junction. Dolichoectatic basilar artery (5 millimeters diameter). The basilar tip is mildly tortuous. The right PCA is occluded at its origin (series 12, image 22, with chronic encephalomalacia noted throughout the right PCA territory. Both SCA and the left PCA origins are patent. There is no right PCA enhancement. The left PCA is mildly irregular at its origin and in the P2 segment, but there is distal left PCA enhancement. Anterior circulation: Both ICA siphons are patent. The left appears non dominant, and the left ACA A1 segment is absent. The right A1 is dominant. The left ICA siphon is mildly irregular without significant stenosis. The right ICA siphon is also mildly irregular, but without discrete plaque. No right ICA siphon stenosis. Normal right ICA terminus, right MCA and dominant right A1 origin. The anterior  communicating artery and bilateral ACA branches are within normal limits. Left MCA M1 segment and bifurcation are patent without stenosis. Left MCA branches are within normal limits. Right MCA M1 and bifurcation are patent without stenosis. Right MCA branches are within normal limits. Venous  sinuses: Early contrast timing, not evaluated. Anatomic variants: Mildly dominant right vertebral artery. Dominant right and absent left ACA A1 segments with associated dominance of the right carotid. Review of the MIP images confirms the above findings IMPRESSION: 1. Negative for emergent large vessel occlusion, and no ischemia or core infarct detected by CT Perfusion. 2. Positive for chronic Right PCA occlusion at its origin, corresponding to chronic right PCA territory encephalomalacia. 3. Ectatic basilar artery. Mild irregularity of both ICA siphons and the Left PCA without significant stenosis. 4. No carotid or vertebral artery abnormality in the neck. These results were communicated to Dr. Cheral Marker at 12:20 pmon 08/14/2018 by text page via the [facility] messaging system. Electronically Signed   By: Genevie Ann M.D.   On: 08/14/2018 12:20   Vas [place] Transcranial Doppler W Bubbles  Result Date: 08/16/2018  Transcranial Doppler with Bubble Indications: Stroke. Performing Technologist: Abram Sander RVS  Examination Guidelines: A complete evaluation includes B-mode imaging, spectral Doppler, color Doppler, and power Doppler as needed of all accessible portions of each vessel. Bilateral testing is considered an integral part of a complete examination. Limited examinations for reoccurring indications may be performed as noted.  Summary:  A vascular evaluation was performed. The left middle cerebral artery was studied. An IV was inserted into the patient's right hand. Verbal informed consent was obtained.  No HITS heard during rest. No HITS heard during valsalva. Negative TCD Bubble study *See table(s) above for measurements and observations.  Diagnosing physician: Antony Contras MD Electronically signed by Antony Contras MD on 08/16/2018 at 1:11:31 PM.    Final    Ct Head Code Stroke Wo Contrast  Result Date: 08/14/2018 CLINICAL DATA:  Code stroke. 43 year old male with left arm weakness. EXAM: CT HEAD WITHOUT CONTRAST TECHNIQUE:  Contiguous axial images were obtained from the base of the skull through the vertex without intravenous contrast. COMPARISON:  None available. FINDINGS: Brain: Chronic encephalomalacia in the right PCA territory, including the right thalamus. No cortically based acute infarct identified. Outside of the right PCA territory gray-white matter differentiation appears symmetric and within normal limits. No acute intracranial hemorrhage identified. No midline shift, mass effect, or evidence of intracranial mass lesion. No ventriculomegaly. Vascular: Questionable asymmetric hyperdensity at the right ICA terminus and MCA origin on series 3, image 10. Skull: Negative. Congenital incomplete ossification of the posterior C1 ring. Sinuses/Orbits: Visualized paranasal sinuses and mastoids are stable and well pneumatized. Other: There is some rightward gaze deviation. No acute scalp soft tissue findings. ASPECTS [facility] Stroke Program Early CT Score) Total score (0-10 with 10 being normal): 10 IMPRESSION: 1. Questionable asymmetric hyperdensity at the right ICA terminus and MCA origin, but no acute cortically based infarct or acute intracranial hemorrhage identified. ASPECTS 10. 2. Chronic right PCA territory infarct. 3. These results were communicated to Dr. Cheral Marker at 11:55 amon 8/2/2020by text page via the [facility] messaging system. Electronically Signed   By: Genevie Ann M.D.   On: 08/14/2018 11:56   Vas [place] Lower Extremity Venous (dvt)  Result Date: 08/16/2018  Lower Venous Study Indications: Stroke.  Comparison Study: No prior study on file for comparison. Performing Technologist: Sharion Dove RVS  Examination Guidelines: A complete evaluation includes B-mode imaging, spectral Doppler, color Doppler,  and power Doppler as needed of all accessible portions of each vessel. Bilateral testing is considered an integral part of a complete examination. Limited examinations for reoccurring indications may be performed as noted.   +---------+---------------+---------+-----------+----------+-------+ RIGHT    CompressibilityPhasicitySpontaneityPropertiesSummary +---------+---------------+---------+-----------+----------+-------+ CFV      Full           Yes      Yes                          +---------+---------------+---------+-----------+----------+-------+ SFJ      Full                                                 +---------+---------------+---------+-----------+----------+-------+ FV Prox  Full                                                 +---------+---------------+---------+-----------+----------+-------+ FV Mid   Full                                                 +---------+---------------+---------+-----------+----------+-------+ FV DistalFull                                                 +---------+---------------+---------+-----------+----------+-------+ PFV      Full                                                 +---------+---------------+---------+-----------+----------+-------+ POP      Full           Yes      Yes                          +---------+---------------+---------+-----------+----------+-------+ PTV      Full                                                 +---------+---------------+---------+-----------+----------+-------+ PERO     Full                                                 +---------+---------------+---------+-----------+----------+-------+   +---------+---------------+---------+-----------+----------+-------+ LEFT     CompressibilityPhasicitySpontaneityPropertiesSummary +---------+---------------+---------+-----------+----------+-------+ CFV      Full           Yes      Yes                          +---------+---------------+---------+-----------+----------+-------+ SFJ      Full                                                 +---------+---------------+---------+-----------+----------+-------+  FV Prox  Full                                                  +---------+---------------+---------+-----------+----------+-------+ FV Mid   Full                                                 +---------+---------------+---------+-----------+----------+-------+ FV DistalFull                                                 +---------+---------------+---------+-----------+----------+-------+ PFV      Full                                                 +---------+---------------+---------+-----------+----------+-------+ POP      Full           Yes      Yes                          +---------+---------------+---------+-----------+----------+-------+ PTV      Full                                                 +---------+---------------+---------+-----------+----------+-------+ PERO     Full                                                 +---------+---------------+---------+-----------+----------+-------+     Summary: Right: There is no evidence of deep vein thrombosis in the lower extremity. Left: There is no evidence of deep vein thrombisis in the lower extremity.  *See table(s) above for measurements and observations. Electronically signed by Waverly Ferrarihristopher Dickson MD on 08/16/2018 at 1:34:47 PM.    Final      The results of significant diagnostics from this hospitalization (including imaging, microbiology, ancillary and laboratory) are listed below for reference.     Microbiology: Recent Results (from the past 240 hour(s))  SARS CORONAVIRUS 2 Nasal Swab Aptima Multi Swab     Status: None   Collection Time: 08/14/18  1:48 PM   Specimen: Aptima Multi Swab; Nasal Swab  Result Value Ref Range Status   SARS Coronavirus 2 NEGATIVE NEGATIVE Final    Comment: (NOTE) SARS-CoV-2 target nucleic acids are NOT DETECTED. The SARS-CoV-2 RNA is generally detectable in upper and lower respiratory specimens during the acute phase of infection. Negative results do not preclude SARS-CoV-2  infection, do not rule out co-infections with other pathogens, and should not be used as the sole basis for treatment or other patient management decisions. Negative results must be combined with clinical observations, patient history, and epidemiological information. The expected result is Negative. Fact Sheet for Patients: HairSlick.nohttps://www.fda.gov/media/138098/download Fact Sheet for Healthcare  Providers: quierodirigir.comhttps://www.fda.gov/media/138095/download This test is not yet approved or cleared by the [place] FDA and  has been authorized for detection and/or diagnosis of SARS-CoV-2 by FDA under an Emergency Use Authorization (EUA). This EUA will remain  in effect (meaning this test can be used) for the duration of the COVID-19 declaration under Section 56 4(b)(1) of the Act, 21 U.S.C. section 360bbb-3(b)(1), unless the authorization is terminated or revoked sooner. Performed at [facility], [address] [place], [place] [address]      Labs: BNP (last 3 results) No results for input(s): BNP in the last 8760 hours. Basic Metabolic Panel: Recent Labs  Lab 08/14/18 1106 08/14/18 1123 08/17/18 0435  NA 141 143 140  K 3.6 3.7 3.2*  CL 108 107 106  CO2 24  --  22  GLUCOSE 155* 149* 118*  BUN 19 21* 10  CREATININE 1.07 1.00 0.85  CALCIUM 9.3  --  9.0   Liver Function Tests: Recent Labs  Lab 08/14/18 1106  AST 18  ALT 30  ALKPHOS 111  BILITOT 0.7  PROT 7.2  ALBUMIN 4.0   No results for input(s): LIPASE, AMYLASE in the last 168 hours. No results for input(s): AMMONIA in the last 168 hours. CBC: Recent Labs  Lab 08/14/18 1106 08/14/18 1123  WBC 12.7*  --   NEUTROABS 10.3*  --   HGB 15.4 15.3  HCT 43.2 45.0  MCV 88.3  --   PLT 185  --    Cardiac Enzymes: No results for input(s): CKTOTAL, CKMB, CKMBINDEX, TROPONINI in the last 168 hours. BNP: Invalid input(s): POCBNP CBG: Recent Labs  Lab 08/14/18 1131  GLUCAP 147*   D-Dimer No results for  input(s): DDIMER in the last 72 hours. Hgb A1c Recent Labs    08/15/18 0847  HGBA1C 6.2*   Lipid Profile Recent Labs    08/16/18 0423  CHOL 136  HDL 30*  LDLCALC 84  TRIG 604112  CHOLHDL 4.5   Thyroid function studies No results for input(s): TSH, T4TOTAL, T3FREE, THYROIDAB in the last 72 hours.  Invalid input(s): FREET3 Anemia work up No results for input(s): VITAMINB12, FOLATE, FERRITIN, TIBC, IRON, RETICCTPCT in the last 72 hours. Urinalysis    Component Value Date/Time   COLORURINE YELLOW 08/14/2018 1133   APPEARANCEUR CLEAR 08/14/2018 1133   LABSPEC 1.020 08/14/2018 1133   PHURINE 6.0 08/14/2018 1133   GLUCOSEU NEGATIVE 08/14/2018 1133   HGBUR SMALL (A) 08/14/2018 1133   BILIRUBINUR NEGATIVE 08/14/2018 1133   KETONESUR NEGATIVE 08/14/2018 1133   PROTEINUR NEGATIVE 08/14/2018 1133   NITRITE NEGATIVE 08/14/2018 1133   LEUKOCYTESUR NEGATIVE 08/14/2018 1133   Sepsis Labs Invalid input(s): PROCALCITONIN,  WBC,  LACTICIDVEN Microbiology Recent Results (from the past 240 hour(s))  SARS CORONAVIRUS 2 Nasal Swab Aptima Multi Swab     Status: None   Collection Time: 08/14/18  1:48 PM   Specimen: Aptima Multi Swab; Nasal Swab  Result Value Ref Range Status   SARS Coronavirus 2 NEGATIVE NEGATIVE Final    Comment: (NOTE) SARS-CoV-2 target nucleic acids are NOT DETECTED. The SARS-CoV-2 RNA is generally detectable in upper and lower respiratory specimens during the acute phase of infection. Negative results do not preclude SARS-CoV-2 infection, do not rule out co-infections with other pathogens, and should not be used as the sole basis for treatment or other patient management decisions. Negative results must be combined with clinical observations, patient history, and epidemiological information. The expected result is Negative. Fact Sheet for Patients:  HairSlick.no Fact Sheet for Healthcare  Providers: quierodirigir.com This test is not yet approved or cleared by the [place] FDA and  has been authorized for detection and/or diagnosis of SARS-CoV-2 by FDA under an Emergency Use Authorization (EUA). This EUA will remain  in effect (meaning this test can be used) for the duration of the COVID-19 declaration under Section 56 4(b)(1) of the Act, 21 U.S.C. section 360bbb-3(b)(1), unless the authorization is terminated or revoked sooner. Performed at [facility], [address], [place], [place] [address]      Time coordinating discharge in minutes: 65  SIGNED:   Calvert Cantor, MD  [facility] 08/17/2018, 4:40 PM Pager   If 7PM-7AM, please contact night-coverage [email or web address] Password [email or web address]

## 2018-08-17 NOTE — H&P (Signed)
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 History taken from chart review and patient. Patient had stopped taking his Plavix due to issues with dental pain.  CTA head neck/perfusion was negative for LVO ischemia and showed chronic right PCA occlusion with right-PCA territory encephalomalacia. MRI brain done revealing acute right thalamic, midbrain and internal capsule infarcts.  UDS was positive for THC. BLE Dopplers negative for DVT.   He had 30-day event monitor earlier this year that was negative for A. fib.  Dr. Leonie Man felt that stroke was embolic due to unknown source and loop recorder recommended however patient declined this.  He had TEE on 08/16/2018 showing EF of 50-60% with intra-atrial septal aneurysm.  No shunting or thrombus noted.   Patient continues to be limited by left hemiparesis with right proptosis, left hand anonymous hemianopsia and left lateral lean with standing.  Therapy ongoing and CIR recommended due to functional decline. Please see preadmission assessment from today as well.  Review of Systems  Constitutional: Negative for chills and fever.  HENT: Negative for hearing loss and tinnitus.   Eyes:       Left field cut.   Respiratory: Negative for cough and shortness of breath.   Cardiovascular: Negative for chest pain and palpitations.  Gastrointestinal: Positive for constipation. Negative for heartburn and nausea.  Genitourinary: Negative for dysuria.  Musculoskeletal: Negative for myalgias.  Skin: Negative for rash.  Neurological: Positive for speech change, focal weakness and weakness. Negative  for dizziness, sensory change and headaches.  Psychiatric/Behavioral: Negative for memory loss. The patient does not have insomnia.   All other systems reviewed and are negative.    Past Medical History:  Diagnosis Date   CVA (cerebral vascular accident) [facility])    Hypertension     Past Surgical History:  Procedure Laterality Date   BUBBLE STUDY  08/16/2018   Procedure: BUBBLE STUDY;  Surgeon: Lelon Perla, MD;  Location: [facility] ENDOSCOPY;  Service: Cardiovascular;;   TEE WITHOUT CARDIOVERSION N/A 08/16/2018   Procedure: TRANSESOPHAGEAL ECHOCARDIOGRAM (TEE);  Surgeon: Lelon Perla, MD;  Location: [facility] ENDOSCOPY;  Service: Cardiovascular;  Laterality: N/A;    Family History  Problem Relation Age of Onset   Diabetes Mother    Diabetes Father    Sickle cell trait Father    Stroke Father      Social History: Lives with girlfriend.  Was working as Metallurgist.  He reports that he has been smoking. He has been smoking about 0.25 packs per day. He has never used smokeless tobacco. He reports current alcohol use. He reports current drug use. Drug: Marijuana daily.    Allergies: No Known Allergies    Medications Prior to Admission  Medication Sig Dispense Refill   acetaminophen (TYLENOL) 650 MG CR tablet Take 650 mg by mouth every 8 (eight) hours as needed for pain.     amLODipine (NORVASC) 10 MG tablet Take 10 mg by mouth at bedtime.      aspirin EC 81 MG tablet Take 81 mg by mouth every morning.      atorvastatin (LIPITOR) 80 MG tablet Take 80  mg by mouth at bedtime.     clopidogrel (PLAVIX) 75 MG tablet Take 75 mg by mouth at bedtime.      gabapentin (NEURONTIN) 300 MG capsule Take 300 mg by mouth 2 (two) times daily. Morning and bedtime     losartan (COZAAR) 50 MG tablet Take 50 mg by mouth every morning.      Multiple Vitamins-Minerals (CENTRUM SILVER 50+MEN) TABS Take 1 tablet by mouth daily with breakfast.      Drug Regimen Review  Drug regimen  was reviewed and remains appropriate with no significant issues identified  Home: Home Living Family/patient expects to be discharged to:: Private residence Living Arrangements: Other relatives, Spouse/significant other Available Help at Discharge: Family, Available 24 hours/day Type of Home: Other(Comment)(townhouse) Home Access: Stairs to enter [employer] of Steps: (pt is unsure) Home Layout: Two level, Bed/bath upstairs Bathroom Shower/Tub: Chiropodist: Standard Home Equipment: Environmental consultant - 2 wheels, Bedside commode, Shower seat Additional Comments: information from OT interview  Lives With: Significant other, Family   Functional History: Prior Function Level of Independence: Independent Comments: was able to walk with no AD  Functional Status:  Mobility: Bed Mobility Overal bed mobility: Needs Assistance Bed Mobility: Supine to Sit Supine to sit: Mod assist Sit to supine: Mod assist General bed mobility comments: mod for lifting up trunk Transfers Overall transfer level: Needs assistance Equipment used: Rolling walker (2 wheeled) Transfers: Sit to/from Stand Sit to Stand: Mod assist, From elevated surface General transfer comment: min to mod assist standing at EOB with LLE instability, Lt lean in standing with no awareness Ambulation/Gait Ambulation/Gait assistance: Mod assist, Max assist, +2 physical assistance, +2 safety/equipment Gait Distance (Feet): 5 Feet Assistive device: Rolling walker (2 wheeled) Gait Pattern/deviations: Step-to pattern, Decreased stride length, Decreased stance time - left, Decreased step length - left, Decreased weight shift to right General Gait Details: pt with heavy L lateral bias; cues for weight shifting, sequencing, and L step length; assistance required for balance and guiding RW Gait velocity: reduced Gait velocity interpretation: <1.8 ft/sec, indicate of risk for recurrent falls    ADL: ADL Overall  ADL's : Needs assistance/impaired Grooming: Minimal assistance, Standing, Moderate assistance Grooming Details (indicate cue type and reason): Min assist for standing balance, Mod assist for dynamic standing when reaching outside BOS Upper Body Bathing: Minimal assistance, Sitting Lower Body Bathing: Moderate assistance, Sit to/from stand Upper Body Dressing : Minimal assistance, Sitting Lower Body Dressing: Sit to/from stand, Moderate assistance Toilet Transfer: Moderate assistance, +2 for physical assistance, RW, BSC Toilet Transfer Details (indicate cue type and reason): simulated stand pivot transfer with RW, +2 for weight shifting and safety during mobility due to LLE instability Functional mobility during ADLs: Moderate assistance, +2 for physical assistance General ADL Comments: +2 for transfers, mod assist standing balance  Cognition: Cognition Overall Cognitive Status: Impaired/Different from baseline Arousal/Alertness: Awake/alert Orientation Level: Oriented X4 Attention: Focused, Sustained Focused Attention: Appears intact(Vigialnce WNL: 1/1) Sustained Attention: Appears intact(Serial 7s: 3/3) Memory: Impaired Memory Impairment: Storage deficit, Retrieval deficit, Decreased recall of new information(Immediate: 5/5; delayed: 4/5 with cues: 1/1) Awareness: Impaired Awareness Impairment: Intellectual impairment Problem Solving: Impaired Problem Solving Impairment: Verbal complex Executive Function: Reasoning, Sequencing, Organizing Reasoning: Impaired Reasoning Impairment: Verbal complex(Abstraction: 1/2) Sequencing: Impaired Sequencing Impairment: Verbal complex(Difficulty drtawing clock: 2/3) Organizing: Appears intact(Backward digt span: 1/2) Cognition Arousal/Alertness: Awake/alert Behavior During Therapy: Flat affect Overall Cognitive Status: Impaired/Different from baseline Area of Impairment: Following commands, Safety/judgement, Awareness, Problem  solving Following Commands: Follows one  step commands consistently, Follows multi-step commands inconsistently, Follows multi-step commands with increased time Safety/Judgement: Decreased awareness of safety, Decreased awareness of deficits Awareness: Intellectual, Emergent Problem Solving: Difficulty sequencing, Requires verbal cues, Requires tactile cues General Comments: leans laterally with no correction to stop it  Physical Exam: Blood pressure (!) 144/104, pulse 72, temperature 98.8 F (37.1 C), temperature source Oral, resp. rate 17, height 5\' 9"  (1.753 m), weight 91.6 kg, SpO2 98 %. Physical Exam  Vitals reviewed. Constitutional: He appears well-developed and well-nourished.  HENT:  Right eye ptosis  Eyes: Right eye exhibits no discharge. Left eye exhibits no discharge.  Unable to open left eyelid  Respiratory: Effort normal. No stridor. No respiratory distress.  GI: He exhibits no distension.  Musculoskeletal:     Comments: No edema or tenderness in extremities  Neurological: He is alert.  Motor: RUE/RLE: 5/5 proximal to distal LUE: Should abduction 1+/5, elbow flex/ext 2-/5, hand grip 3/5 with apraxia LLE: HF, KE 3-/5 with apraxia, ADF 0/5 Sensation intact to light touch  Skin: Skin is warm and dry.  Psychiatric: He has a normal mood and affect. His behavior is normal.    Results for orders placed or performed during the hospital encounter of 08/14/18 (from the past 48 hour(s))  Lipid panel     Status: Abnormal   Collection Time: 08/16/18  4:23 AM  Result Value Ref Range   Cholesterol 136 0 - 200 mg/dL   Triglycerides 244112 <010<150 mg/dL   HDL 30 (L) >27>40 mg/dL   Total CHOL/HDL Ratio 4.5 RATIO   VLDL 22 0 - 40 mg/dL   LDL Cholesterol 84 0 - 99 mg/dL    Comment:        Total Cholesterol/HDL:CHD Risk Coronary Heart Disease Risk Table                     Men   Women  1/2 Average Risk   3.4   3.3  Average Risk       5.0   4.4  2 X Average Risk   9.6   7.1  3 X Average  Risk  23.4   11.0        Use the calculated Patient Ratio above and the CHD Risk Table to determine the patient's CHD Risk.        ATP III CLASSIFICATION (LDL):  <100     mg/dL   Optimal  [id number]  mg/dL   Near or Above                    Optimal  130-159  mg/dL   Borderline  [id number]  mg/dL   High  >259>190     mg/dL   Very High Performed at [facility], [address] [place], [place] [address]   Basic metabolic panel     Status: Abnormal   Collection Time: 08/17/18  4:35 AM  Result Value Ref Range   Sodium 140 135 - 145 mmol/L   Potassium 3.2 (L) 3.5 - 5.1 mmol/L   Chloride 106 98 - 111 mmol/L   CO2 22 22 - 32 mmol/L   Glucose, Bld 118 (H) 70 - 99 mg/dL   BUN 10 6 - 20 mg/dL   Creatinine, Ser 5.640.85 0.61 - 1.24 mg/dL   Calcium 9.0 8.9 - 33.210.3 mg/dL   GFR calc non Af Amer >60 >60 mL/min   GFR calc Af Amer >60 >60 mL/min   Anion gap 12 5 -  15    Comment: Performed at [facility], [address] [place], [place] [address]   Ct Head Wo Contrast  Result Date: 08/16/2018 CLINICAL DATA:  Stroke, follow-up, headache EXAM: CT HEAD WITHOUT CONTRAST TECHNIQUE: Contiguous axial images were obtained from the base of the skull through the vertex without intravenous contrast. COMPARISON:  08/14/2018 CT head and MR brain exams FINDINGS: Brain: Normal ventricular morphology. No midline shift. Old RIGHT occipital infarct. Evolving subacute infarct involving the RIGHT thalamus and midbrain. No hemorrhagic transformation. No new areas of infarction, intracranial hemorrhage, or mass lesion. Vascular: No definite hyperdense vessels Skull: Intact Sinuses/Orbits: Clear Other: N/A IMPRESSION: Old infarct RIGHT occipital lobe. Evolving subacute infarct RIGHT thalamus and midbrain. No new intracranial abnormalities. Electronically Signed   By: Ulyses SouthwardMark  Boles M.D.   On: 08/16/2018 15:55       Medical Problem List and Plan: 1. Left hemiparesis with right proptosis, left hand anonymous hemianopsia  and left lateral lean secondary to  acute right thalamic, midbrain and internal capsule infarcts.  Admit to CIR. 2.  Antithrombotics: -DVT/anticoagulation:  Pharmaceutical: Lovenox  -antiplatelet therapy: ASA and Plavix x3 weeks followed by Plavix alone 3. Pain Management: N/A 4. Mood: LCSW to follow for evaluation and support  -antipsychotic agents: N/A 5. Neuropsych: This patient is ?fully capable of making decisions on his own behalf. 6. Skin/Wound Care: Routine pressure relief measures 7. Fluids/Electrolytes/Nutrition: Monitor I/Os.    BMP ordered for tomorrow AM 8.  Prediabetes: Hemoglobin A1c is-6.2. Carb modified restrictions added to diet.  Will have dietitian educate patient on appropriate diet.  Monitor blood sugars AC at bedtime for 2 to 3 days  Monitor with increased mobility 9.  Hypokalemia: Will add K-Dur for supplement x2 days  Labs ordered for tomroow 10.  Dyslipidemia: On Lipitor 11.  HTN: Permissive hypertension < 220/120 for couple more days.  Long-term goal is normotensive  Monitor with increase mobility. Avoid hypoperfusion 12. Substance abuse: Counsel  Post Admission Physician Evaluation: 1. Preadmission assessment reviewed and changes made below. 2. Functional deficits secondary  to right thalamic, midbrain and internal capsule infarcts. 3. Patient is admitted to receive collaborative, interdisciplinary care between the physiatrist, rehab nursing staff, and therapy team. 4. Patient's level of medical complexity and substantial therapy needs in context of that medical necessity cannot be provided at a lesser intensity of care such as a SNF. 5. Patient has experienced substantial functional loss from his/her baseline which was documented above under the "Functional History" and "Functional Status" headings.  Judging by the patient's diagnosis, physical exam, and functional history, the patient has potential for functional progress which will result in measurable gains  while on inpatient rehab.  These gains will be of substantial and practical use upon discharge  in facilitating mobility and self-care at the household level. 6. Physiatrist will provide 24 hour management of medical needs as well as oversight of the therapy plan/treatment and provide guidance as appropriate regarding the interaction of the two. 7. 24 hour rehab nursing will assist with bladder management, safety, disease management, medication administration and patient education  and help integrate therapy concepts, techniques,education, etc. 8. PT will assess and treat for/with: Lower extremity strength, range of motion, stamina, balance, functional mobility, safety, adaptive techniques and equipment, wound care, coping skills, pain control, education. Goals are: Min A. 9. OT will assess and treat for/with: ADL's, functional mobility, safety, upper extremity strength, adaptive techniques and equipment, wound mgt, ego support, and community reintegration.   Goals are:  Min A. Therapy may proceed with showering this patient. 10. SLP will assess and treat for/with: speech, cognition.  Goals are: Supervision. 11. Case Management and Social Worker will assess and treat for psychological issues and discharge planning. 12. Team conference will be held weekly to assess progress toward goals and to determine barriers to discharge. 13. Patient will receive at least 3 hours of therapy per day at least 5 days per week. 14. ELOS: 17-21 days.       15. Prognosis:  good  I have personally performed a face to face diagnostic evaluation, including, but not limited to relevant history and physical exam findings, of this patient and developed relevant assessment and plan.  Additionally, I have reviewed and concur with the physician assistant's documentation above.  Maryla MorrowAnkit Tishawna Larouche, MD, ABPMR Jacquelynn CreePamela S Love, PA-C 08/17/2018

## 2018-08-17 NOTE — Progress Notes (Addendum)
PROGRESS NOTE    Todd Hood   ZOX:[id number]  DOB: 01/20/1975  DOA: 08/14/2018 PCP: Patient, No Pcp Per   Brief Narrative:  Todd Hood is a 43 y/o male with h/o Large subacute right PCA stroke treated at [facility] 03/01/18/2020 with residual left eye visual impairment and left sided weakness, HTN, prediabetes, HLD who presents with increasing left sided weakness and gait disturbance.   MRI on 8/2 > acute right midbrain, thalamic, internal capsule infarcts. Chronic RIGHT occipital and posterior temporal PCA territory infarcts.   Subjective: He has no new complaints today. Plan discussed with him and his father at bedside.     Assessment & Plan:   Principal Problem:   Acute ischemic stroke - h/o prior infarct as mentioned above - now has worsening of prior left sided weakness.  - TCD with bubble study negative for PFO - 2 D ECHO unrevealing - TEE showed no cardiac source or PFO- found to have a septal aneurysm - he has declined a loop recorder due to cost- he had a 30 day event monitor previously which did not find any arrythmias  - A1c 6.2 - LDL 84 - per neuro, cont Aspirin and Plavix for 3 wks followed by Plavix alone - cont statin - awaiting CIR to confirm if they will accept him for rehab  Active Problems:   HTN (hypertension) - currently allowing permissive HTN and Amlodipine and Losartan on hold  Hypokalemia - replaced- recheck tomorrow    Prediabetes - A1c 6.2 - needs dietary changes and weight loss  Intra-atrial septal aneurysm - f/u as outpt    Marijuana use - advised to quit   Time spent in minutes: 40 min- plan discussed with Neurology and SW DVT prophylaxis: Lovenox Code Status: Full code Family Communication: plan discussed with father at bedside Disposition Plan:  CIR Consultants:   neuro Procedures:   TTE   1. The left ventricle has a visually estimated ejection fraction of 55%. The cavity size was normal. There is mildly increased  left ventricular wall thickness. Left ventricular diastolic Doppler parameters are consistent with pseudonormalization. No  evidence of left ventricular regional wall motion abnormalities.  2. The right ventricle has normal systolic function. The cavity was normal. There is no increase in right ventricular wall thickness.  3. Trivial pericardial effusion is present.  4. No evidence of mitral valve stenosis. No mitral regurgitation.  5. The aortic valve is tricuspid. No stenosis of the aortic valve.  6. The aorta is normal in size and structure.  7. There is mild dilatation of the aortic root measuring 37 mm.  8. The IVC was normal in size. No complete TR doppler jet so unable to estimate PA systolic pressure.   TEE  1. The left ventricle has normal systolic function, with an ejection fraction of 55-60%. No evidence of left ventricular regional wall motion abnormalities.  2. The right ventricle has normal systolc function. The cavity was normal.  3. The aortic valve is tricuspid No stenosis of the aortic valve.  4. There is evidence of mild plaque in the descending aorta.  5. The interatrial septum is aneurysmal.  6. Normal LV systolic function; atrial septal aneurysm; negative saline microcavitation study.  Antimicrobials:  Anti-infectives (From admission, onward)   None       Objective: Vitals:   08/17/18 0015 08/17/18 0428 08/17/18 0825 08/17/18 1209  BP: (!) 150/93 (!) 148/97 (!) 145/101 (!) 149/104  Pulse: 74 65 78 77  Resp: 20 (!) 22  15 18  Temp: 98.5 F (36.9 C) 98.4 F (36.9 C) 98.8 F (37.1 C) 98.2 F (36.8 C)  TempSrc: Oral Oral Axillary Axillary  SpO2: 100% 100% 100% 100%  Weight:      Height:        Intake/Output Summary (Last 24 hours) at 08/17/2018 1551 Last data filed at 08/17/2018 0915 Gross per 24 hour  Intake 1600 ml  Output 1000 ml  Net 600 ml   Filed Weights   08/14/18 1051 08/14/18 2133  Weight: 87.1 kg 91.6 kg    Examination: General exam:  Appears comfortable  HEENT: PERRLA, oral mucosa moist, no sclera icterus or thrush Respiratory system: Clear to auscultation. Respiratory effort normal. Cardiovascular system: S1 & S2 heard, RRR.   Gastrointestinal system: Abdomen soft, non-tender, nondistended. Normal bowel sounds. Central nervous system: Alert and oriented. Left arm 0/5 strength- left leg 2/5. Unable to open right eye.  Extremities: No cyanosis, clubbing or edema Skin: No rashes or ulcers Psychiatry:  Mood & affect appropriate.     Data Reviewed: I have personally reviewed following labs and imaging studies  CBC: Recent Labs  Lab 08/14/18 1106 08/14/18 1123  WBC 12.7*  --   NEUTROABS 10.3*  --   HGB 15.4 15.3  HCT 43.2 45.0  MCV 88.3  --   PLT 185  --    Basic Metabolic Panel: Recent Labs  Lab 08/14/18 1106 08/14/18 1123 08/17/18 0435  NA 141 143 140  K 3.6 3.7 3.2*  CL 108 107 106  CO2 24  --  22  GLUCOSE 155* 149* 118*  BUN 19 21* 10  CREATININE 1.07 1.00 0.85  CALCIUM 9.3  --  9.0   GFR: Estimated Creatinine Clearance: 128 mL/min (by C-G formula based on SCr of 0.85 mg/dL). Liver Function Tests: Recent Labs  Lab 08/14/18 1106  AST 18  ALT 30  ALKPHOS 111  BILITOT 0.7  PROT 7.2  ALBUMIN 4.0   No results for input(s): LIPASE, AMYLASE in the last 168 hours. No results for input(s): AMMONIA in the last 168 hours. Coagulation Profile: Recent Labs  Lab 08/14/18 1106  INR 1.0   Cardiac Enzymes: No results for input(s): CKTOTAL, CKMB, CKMBINDEX, TROPONINI in the last 168 hours. BNP (last 3 results) No results for input(s): PROBNP in the last 8760 hours. HbA1C: Recent Labs    08/15/18 0847  HGBA1C 6.2*   CBG: Recent Labs  Lab 08/14/18 1131  GLUCAP 147*   Lipid Profile: Recent Labs    08/16/18 0423  CHOL 136  HDL 30*  LDLCALC 84  TRIG 161112  CHOLHDL 4.5   Thyroid Function Tests: No results for input(s): TSH, T4TOTAL, FREET4, T3FREE, THYROIDAB in the last 72 hours.  Anemia Panel: No results for input(s): VITAMINB12, FOLATE, FERRITIN, TIBC, IRON, RETICCTPCT in the last 72 hours. Urine analysis:    Component Value Date/Time   COLORURINE YELLOW 08/14/2018 1133   APPEARANCEUR CLEAR 08/14/2018 1133   LABSPEC 1.020 08/14/2018 1133   PHURINE 6.0 08/14/2018 1133   GLUCOSEU NEGATIVE 08/14/2018 1133   HGBUR SMALL (A) 08/14/2018 1133   BILIRUBINUR NEGATIVE 08/14/2018 1133   KETONESUR NEGATIVE 08/14/2018 1133   PROTEINUR NEGATIVE 08/14/2018 1133   NITRITE NEGATIVE 08/14/2018 1133   LEUKOCYTESUR NEGATIVE 08/14/2018 1133   Sepsis Labs: @LABRCNTIP (procalcitonin:4,lacticidven:4) ) Recent Results (from the past 240 hour(s))  SARS CORONAVIRUS 2 Nasal Swab Aptima Multi Swab     Status: None   Collection Time: 08/14/18  1:48 PM   Specimen:  Aptima Multi Swab; Nasal Swab  Result Value Ref Range Status   SARS Coronavirus 2 NEGATIVE NEGATIVE Final    Comment: (NOTE) SARS-CoV-2 target nucleic acids are NOT DETECTED. The SARS-CoV-2 RNA is generally detectable in upper and lower respiratory specimens during the acute phase of infection. Negative results do not preclude SARS-CoV-2 infection, do not rule out co-infections with other pathogens, and should not be used as the sole basis for treatment or other patient management decisions. Negative results must be combined with clinical observations, patient history, and epidemiological information. The expected result is Negative. Fact Sheet for Patients: SugarRoll.be Fact Sheet for Healthcare Providers: https://www.woods-mathews.com/ This test is not yet approved or cleared by the [place] FDA and  has been authorized for detection and/or diagnosis of SARS-CoV-2 by FDA under an Emergency Use Authorization (EUA). This EUA will remain  in effect (meaning this test can be used) for the duration of the COVID-19 declaration under Section 56 4(b)(1) of the Act, 21 U.S.C.  section 360bbb-3(b)(1), unless the authorization is terminated or revoked sooner. Performed at [facility], [place] [address] [place], [place] [address]          Radiology Studies: Ct Head Wo Contrast  Result Date: 08/16/2018 CLINICAL DATA:  Stroke, follow-up, headache EXAM: CT HEAD WITHOUT CONTRAST TECHNIQUE: Contiguous axial images were obtained from the base of the skull through the vertex without intravenous contrast. COMPARISON:  08/14/2018 CT head and MR brain exams FINDINGS: Brain: Normal ventricular morphology. No midline shift. Old RIGHT occipital infarct. Evolving subacute infarct involving the RIGHT thalamus and midbrain. No hemorrhagic transformation. No new areas of infarction, intracranial hemorrhage, or mass lesion. Vascular: No definite hyperdense vessels Skull: Intact Sinuses/Orbits: Clear Other: N/A IMPRESSION: Old infarct RIGHT occipital lobe. Evolving subacute infarct RIGHT thalamus and midbrain. No new intracranial abnormalities. Electronically Signed   By: Lavonia Dana M.D.   On: 08/16/2018 15:55      Scheduled Meds: . aspirin  300 mg Rectal Daily   Or  . aspirin  325 mg Oral Daily  . atorvastatin  80 mg Oral QHS  . clopidogrel  75 mg Oral Daily  . enoxaparin (LOVENOX) injection  40 mg Subcutaneous Q24H  . multivitamin with minerals  1 tablet Oral Q breakfast  . senna-docusate  1 tablet Oral QHS   Continuous Infusions:   LOS: 3 days      Debbe Odea, MD [facility] Pager: [email or web address] Password [email or web address] 08/17/2018, 3:51 PM

## 2018-08-17 NOTE — Progress Notes (Signed)
Inpatient Rehabilitation-Admissions Coordinator   Met with pt and his girlfriend at the bedside. Pt in agreement with CIR. Consent forms signed. AC has received medical clearance from Dr. Wynelle Cleveland for DC to CIR today.   RN, CM/SW aware of plan.   Jhonnie Garner, OTR/L  Rehab Admissions Coordinator  [phone] 08/17/2018 4:54 PM

## 2018-08-18 ENCOUNTER — Inpatient Hospital Stay (HOSPITAL_COMMUNITY): Payer: Self-pay | Admitting: Physical Therapy

## 2018-08-18 ENCOUNTER — Inpatient Hospital Stay (HOSPITAL_COMMUNITY): Payer: Self-pay | Admitting: Speech Pathology

## 2018-08-18 ENCOUNTER — Other Ambulatory Visit: Payer: Self-pay

## 2018-08-18 ENCOUNTER — Inpatient Hospital Stay (HOSPITAL_COMMUNITY): Payer: Self-pay | Admitting: Occupational Therapy

## 2018-08-18 DIAGNOSIS — I10 Essential (primary) hypertension: Secondary | ICD-10-CM

## 2018-08-18 DIAGNOSIS — E876 Hypokalemia: Secondary | ICD-10-CM

## 2018-08-18 DIAGNOSIS — I639 Cerebral infarction, unspecified: Secondary | ICD-10-CM

## 2018-08-18 DIAGNOSIS — G8194 Hemiplegia, unspecified affecting left nondominant side: Secondary | ICD-10-CM

## 2018-08-18 LAB — CBC WITH DIFFERENTIAL/PLATELET
Abs Immature Granulocytes: 0.03 10*3/uL (ref 0.00–0.07)
Basophils Absolute: 0 10*3/uL (ref 0.0–0.1)
Basophils Relative: 0 %
Eosinophils Absolute: 0.1 10*3/uL (ref 0.0–0.5)
Eosinophils Relative: 1 %
HCT: 43.2 % (ref 39.0–52.0)
Hemoglobin: 15.8 g/dL (ref 13.0–17.0)
Immature Granulocytes: 0 %
Lymphocytes Relative: 33 %
Lymphs Abs: 2.3 10*3/uL (ref 0.7–4.0)
MCH: 31.5 pg (ref 26.0–34.0)
MCHC: 36.6 g/dL — ABNORMAL HIGH (ref 30.0–36.0)
MCV: 86.1 fL (ref 80.0–100.0)
Monocytes Absolute: 0.8 10*3/uL (ref 0.1–1.0)
Monocytes Relative: 12 %
Neutro Abs: 3.8 10*3/uL (ref 1.7–7.7)
Neutrophils Relative %: 54 %
Platelets: 200 10*3/uL (ref 150–400)
RBC: 5.02 MIL/uL (ref 4.22–5.81)
RDW: 13.1 % (ref 11.5–15.5)
WBC: 7 10*3/uL (ref 4.0–10.5)
nRBC: 0 % (ref 0.0–0.2)

## 2018-08-18 LAB — COMPREHENSIVE METABOLIC PANEL
ALT: 21 U/L (ref 0–44)
AST: 14 U/L — ABNORMAL LOW (ref 15–41)
Albumin: 3.6 g/dL (ref 3.5–5.0)
Alkaline Phosphatase: 92 U/L (ref 38–126)
Anion gap: 9 (ref 5–15)
BUN: 14 mg/dL (ref 6–20)
CO2: 22 mmol/L (ref 22–32)
Calcium: 8.8 mg/dL — ABNORMAL LOW (ref 8.9–10.3)
Chloride: 106 mmol/L (ref 98–111)
Creatinine, Ser: 0.99 mg/dL (ref 0.61–1.24)
GFR calc Af Amer: >60 mL/min (ref >60)
GFR calc non Af Amer: >60 mL/min (ref >60)
Glucose, Bld: 128 mg/dL — ABNORMAL HIGH (ref 70–99)
Potassium: 3.8 mmol/L (ref 3.5–5.1)
Sodium: 137 mmol/L (ref 135–145)
Total Bilirubin: 1.2 mg/dL (ref 0.3–1.2)
Total Protein: 7 g/dL (ref 6.5–8.1)

## 2018-08-18 LAB — ANA W/REFLEX IF POSITIVE: Anti Nuclear Antibody (ANA): NEGATIVE

## 2018-08-18 LAB — SICKLE CELL SCREEN: Sickle Cell Screen: NEGATIVE

## 2018-08-18 NOTE — Evaluation (Signed)
Speech Language Pathology Assessment and Plan  Patient Details  Name: Todd Hood MRN: [id number] Date of Birth: 1975/09/07  Evaluation Only  Today's Date: 08/18/2018 SLP Individual Time: 1100-1147 SLP Individual Time Calculation (min): 47 min   Problem List:  Patient Active Problem List   Diagnosis Date Noted  . Right thalamic stroke ([place]) 08/17/2018  . Dyslipidemia   . Hypokalemia   . Hemiparesis affecting left side as late effect of stroke ([place])   . HTN (hypertension) 08/15/2018  . Prediabetes 08/15/2018  . Marijuana use 08/15/2018  . Acute ischemic stroke ([place]) 08/14/2018   Past Medical History:  Past Medical History:  Diagnosis Date  . CVA (cerebral vascular accident) ([place])   . Hypertension    Past Surgical History:  Past Surgical History:  Procedure Laterality Date  . BUBBLE STUDY  08/16/2018   Procedure: BUBBLE STUDY;  Surgeon: Lelon Perla, MD;  Location: [facility] ENDOSCOPY;  Service: Cardiovascular;;  . TEE WITHOUT CARDIOVERSION N/A 08/16/2018   Procedure: TRANSESOPHAGEAL ECHOCARDIOGRAM (TEE);  Surgeon: Lelon Perla, MD;  Location: [facility] ENDOSCOPY;  Service: Cardiovascular;  Laterality: N/A;    Assessment / Plan / Recommendation Clinical Impression Zeppelin Commisso is a 43 year old male with history of HTN, R-CVA 2/20 with residual mild left-sided weakness and left peripheral vision loss; who was admitted on 08/14/2018 after a fall, htting the back of his head with left-sided weakness, left facial weakness, and dysarthria. History taken from chart review and patient. Patient had stopped taking his Plavix due to issues with dental pain.  CTA head neck/perfusion was negative for LVO ischemia and showed chronic right PCA occlusion with right-PCA territory encephalomalacia. MRI brain done revealing acute right thalamic, midbrain and internal capsule infarcts.  UDS was positive for THC. BLE Dopplers negative for DVT. He had 30-day event monitor earlier this year that was negative  for A. fib.  Dr. Leonie Man felt that stroke was embolic due to unknown source and loop recorder recommended however patient declined this.  He had TEE on 08/16/2018 showing EF of 50-60% with intra-atrial septal aneurysm.  No shunting or thrombus noted.   Patient continues to be limited by left hemiparesis with right proptosis, left hand anonymous hemianopsia and left lateral lean with standing.  Therapy ongoing and CIR recommended due to functional decline.  During this cognitive linguistic evaluation, pt presents with functional cognitive linguistic abilities that are considered to be at baseline. Pt demonstrates appropriate orientation, attention to task, recall, ability to problem solve situation and appropriate insight into current deficits related to this stroke and safety awareness. No further acute needs were identified.    Skilled Therapeutic Interventions          Skilled treatment session focused on completion of above mentioned evaluation and education. Pt has great support system at baseline. SLP consulted with interdisciplinary team who are in agreement with discharging skilled ST.    SLP Assessment  Patient does not need any further Speech [place] Pathology Services    Recommendations  Recommendations for Other Services: Neuropsych consult Patient destination: Home Follow up Recommendations: None Equipment Recommended: None recommended by SLP           Pain Pain Assessment Pain Scale: 0-10 Pain Score: 0-No pain  Prior Functioning Cognitive/Linguistic Baseline: Baseline deficits Baseline deficit details: Word retrieval difficulty  Type of Home: Other(Comment)  Lives With: Significant other;Family Available Help at Discharge: Family;Available 24 hours/day Education: Bachelors Vocation: Part time employment  Short Term Goals: No short term goals set  Refer to Care Plan for Long Term Goals  Recommendations for other services: Neuropsych  Discharge Criteria: Patient will  be discharged from SLP if patient refuses treatment 3 consecutive times without medical reason, if treatment goals not met, if there is a change in medical status, if patient makes no progress towards goals or if patient is discharged from hospital.  The above assessment, treatment plan, treatment alternatives and goals were discussed and mutually agreed upon: by patient  Happi Overton 08/18/2018, 2:25 PM

## 2018-08-18 NOTE — Progress Notes (Signed)
[facility] PHYSICAL MEDICINE & REHABILITATION PROGRESS NOTE   Subjective/Complaints: Pt up with therapy already this morning  ROS: Patient denies fever, rash, sore throat, blurred vision, nausea, vomiting, diarrhea, cough, shortness of breath or chest pain, joint or back pain, headache, or mood change.    Objective:   Ct Head Wo Contrast  Result Date: 08/16/2018 CLINICAL DATA:  Stroke, follow-up, headache EXAM: CT HEAD WITHOUT CONTRAST TECHNIQUE: Contiguous axial images were obtained from the base of the skull through the vertex without intravenous contrast. COMPARISON:  08/14/2018 CT head and MR brain exams FINDINGS: Brain: Normal ventricular morphology. No midline shift. Old RIGHT occipital infarct. Evolving subacute infarct involving the RIGHT thalamus and midbrain. No hemorrhagic transformation. No new areas of infarction, intracranial hemorrhage, or mass lesion. Vascular: No definite hyperdense vessels Skull: Intact Sinuses/Orbits: Clear Other: N/A IMPRESSION: Old infarct RIGHT occipital lobe. Evolving subacute infarct RIGHT thalamus and midbrain. No new intracranial abnormalities. Electronically Signed   By: Ulyses SouthwardMark  Boles M.D.   On: 08/16/2018 15:55   Recent Labs    08/18/18 0554  WBC 7.0  HGB 15.8  HCT 43.2  PLT 200   Recent Labs    08/17/18 0435 08/18/18 0554  NA 140 137  K 3.2* 3.8  CL 106 106  CO2 22 22  GLUCOSE 118* 128*  BUN 10 14  CREATININE 0.85 0.99  CALCIUM 9.0 8.8*    Intake/Output Summary (Last 24 hours) at 08/18/2018 1058 Last data filed at 08/18/2018 0819 Gross per 24 hour  Intake 600 ml  Output 1000 ml  Net -400 ml     Physical Exam: Vital Signs Blood pressure (!) 144/103, pulse 74, temperature 98 F (36.7 C), temperature source Oral, resp. rate 16, height 5\' 9"  (1.753 m), weight 91.2 kg, SpO2 100 %.  Constitutional: No distress . Vital signs reviewed. HEENT: EOMI, oral membranes moist Neck: supple Cardiovascular: RRR without murmur. No JVD     Respiratory: CTA Bilaterally without wheezes or rales. Normal effort    GI: BS +, non-tender, non-distended  Musculoskeletal:     Comments: No edema or tenderness in extremities  Neurological: He is alert. Right lid ptosis. Ophthalmoplegia left. Left HH? Motor: RUE/RLE: 5/5 proximal to distal LUE: Should abduction 1 to 1+/5, elbow flex/ext 2-/5, hand grip tr to 1/5.  LLE: HF, KE 3-/5   ADF/APF 0/5 Sensation decreased to LT, sl hypersensitive  Skin: Skin is warm and dry.  Psychiatric: flat but cooperative   Assessment/Plan: 1. Functional deficits secondary to Right thalamic infarct which require 3+ hours per day of interdisciplinary therapy in a comprehensive inpatient rehab setting.  Physiatrist is providing close team supervision and 24 hour management of active medical problems listed below.  Physiatrist and rehab team continue to assess barriers to discharge/monitor patient progress toward functional and medical goals  Care Tool:  Bathing              Bathing assist       Upper Body Dressing/Undressing Upper body dressing   What is the patient wearing?: Hospital gown only    Upper body assist Assist Level: Minimal Assistance - Patient > 75%    Lower Body Dressing/Undressing Lower body dressing      What is the patient wearing?: Underwear/pull up     Lower body assist Assist for lower body dressing: Moderate Assistance - Patient 50 - 74%     Toileting Toileting    Toileting assist Assist for toileting: Moderate Assistance - Patient 50 - 74%  Transfers Chair/bed transfer  Transfers assist  Chair/bed transfer activity did not occur: Safety/medical concerns        Locomotion Ambulation   Ambulation assist              Walk 10 feet activity   Assist           Walk 50 feet activity   Assist           Walk 150 feet activity   Assist           Walk 10 feet on uneven surface  activity   Assist            Wheelchair     Assist               Wheelchair 50 feet with 2 turns activity    Assist            Wheelchair 150 feet activity     Assist          Medical Problem List and Plan: 1. Left hemiparesis with right proptosis, left hand homonymous hemianopsia and left lateral lean secondary to  acute right thalamic, midbrain and internal capsule infarcts.             Admit to CIR. 2.  Antithrombotics: -DVT/anticoagulation:  Pharmaceutical: Lovenox             -antiplatelet therapy: ASA and Plavix x3 weeks followed by Plavix alone 3. Pain Management: N/A 4. Mood: LCSW to follow for evaluation and support             -antipsychotic agents: N/A 5. Neuropsych: This patient is ?fully capable of making decisions on his own behalf. 6. Skin/Wound Care: Routine pressure relief measures 7. Fluids/Electrolytes/Nutrition: Monitor I/Os.               I personally reviewed the patient's labs today.   8.  Prediabetes: Hemoglobin A1c is-6.2. Carb modified restrictions added to diet.  Will have dietitian educate patient on appropriate diet.  Monitor blood sugars AC at bedtime for 2 to 3 days             -folow for pattern  -need am readings 9.  Hypokalemia: potassium 3.8, continue K+ supp 10.  Dyslipidemia: On Lipitor 11.  HTN: Permissive hypertension < 220/120 for today/tomorrow and then start meds 12. Substance abuse: Counsel    LOS: 1 days A FACE TO [place] EVALUATION WAS PERFORMED  Meredith Staggers 08/18/2018, 10:58 AM

## 2018-08-18 NOTE — Plan of Care (Signed)
Pt has on condom catheter goal is to discontinue in the morning Pt has not had BM since 08/14/18 medication ordered and given

## 2018-08-18 NOTE — Care Management Note (Signed)
Inpatient [facility] Individual Statement of Services  Patient Name:  Todd Hood  Date:  08/18/2018  Welcome to the [facility].  Our goal is to provide you with an individualized program based on your diagnosis and situation, designed to meet your specific needs.  With this comprehensive rehabilitation program, you will be expected to participate in at least 3 hours of rehabilitation therapies Monday-Friday, with modified therapy programming on the weekends.  Your rehabilitation program will include the following services:  Physical Therapy (PT), Occupational Therapy (OT), Speech Therapy (ST), 24 hour per day rehabilitation nursing, Therapeutic Recreaction (TR), Neuropsychology, Case Management (Social Worker), Rehabilitation Medicine, Nutrition Services and Pharmacy Services  Weekly team conferences will be held on Wednesday to discuss your progress.  Your Social Worker will talk with you frequently to get your input and to update you on team discussions.  Team conferences with you and your family in attendance may also be held.  Expected length of stay: 2.5-3 weeks  Overall anticipated outcome: CGA-ambulation & min assist with ADL's  Depending on your progress and recovery, your program may change. Your Social Worker will coordinate services and will keep you informed of any changes. Your Social Worker's name and contact numbers are listed  below.  The following services may also be recommended but are not provided by the [facility]:    [place] will be made to provide these services after discharge if needed.  Arrangements include referral to agencies that provide these services.  Your insurance has been verified to be:  Pending Medicaid Your primary doctor is:  Bartholome Bill  Pertinent information will be shared with your  doctor and your insurance company.  Social Worker:  Ovidio Kin, [place] or ([phone]  Information discussed with and copy given to patient by: Elease Hashimoto, 08/18/2018, 9:26 AM

## 2018-08-18 NOTE — Progress Notes (Signed)
Inpatient Rehabilitation  Patient information reviewed and entered into eRehab system by Yitta Gongaware M. Deserie Dirks, M.A., CCC/SLP, PPS Coordinator.  Information including medical coding, functional ability and quality indicators will be reviewed and updated through discharge.    

## 2018-08-18 NOTE — Evaluation (Signed)
Occupational Therapy Assessment and Plan  Patient Details  Name: Todd Hood MRN: [id number] Date of Birth: April 05, 1975  OT Diagnosis: abnormal posture, disturbance of vision, hemiplegia affecting non-dominant side, muscle weakness (generalized) and coordination disorder Rehab Potential: Rehab Potential (ACUTE ONLY): Good ELOS: 2.5- 3 weeks   Today's Date: 08/18/2018 OT Individual Time: 4132-4401 OT Individual Time Calculation (min): 58 min     Problem List:  Patient Active Problem List   Diagnosis Date Noted  . Right thalamic stroke ([place]) 08/17/2018  . Dyslipidemia   . Hypokalemia   . Hemiparesis affecting left side as late effect of stroke ([place])   . HTN (hypertension) 08/15/2018  . Prediabetes 08/15/2018  . Marijuana use 08/15/2018  . Acute ischemic stroke ([place]) 08/14/2018    Past Medical History:  Past Medical History:  Diagnosis Date  . CVA (cerebral vascular accident) ([place])   . Hypertension    Past Surgical History:  Past Surgical History:  Procedure Laterality Date  . BUBBLE STUDY  08/16/2018   Procedure: BUBBLE STUDY;  Surgeon: Lelon Perla, MD;  Location: [facility] ENDOSCOPY;  Service: Cardiovascular;;  . TEE WITHOUT CARDIOVERSION N/A 08/16/2018   Procedure: TRANSESOPHAGEAL ECHOCARDIOGRAM (TEE);  Surgeon: Lelon Perla, MD;  Location: [facility] ENDOSCOPY;  Service: Cardiovascular;  Laterality: N/A;    Assessment & Plan Clinical Impression: Patient is a 43 y.o. year old male with history of HTN, R-CVA 2/20 with residual mild left-sided weakness and left peripheral vision loss; who was admitted on 08/14/2018 after a fall, htting the back of his head with left-sided weakness, left facial weakness, and dysarthria. History taken from chart review and patient. Patient had stopped taking his Plavix due to issues with dental pain.  CTA head neck/perfusion was negative for LVO ischemia and showed chronic right PCA occlusion with right-PCA territory encephalomalacia. MRI brain done  revealing acute right thalamic, midbrain and internal capsule infarcts.  UDS was positive for THC. BLE Dopplers negative for DVT.   He had 30-day event monitor earlier this year that was negative for A. fib.  Dr. Leonie Man felt that stroke was embolic due to unknown source and loop recorder recommended however patient declined this.  He had TEE on 08/16/2018 showing EF of 50-60% with intra-atrial septal aneurysm.  No shunting or thrombus noted.   Patient continues to be limited by left hemiparesis with right proptosis, left hand anonymous hemianopsia and left lateral lean with standing.  Therapy ongoing and CIR recommended due to functional decline.  .  Patient transferred to CIR on 08/17/2018 .    Patient currently requires total with basic self-care skills and IADL secondary to muscle weakness, decreased cardiorespiratoy endurance, decreased coordination and decreased motor planning, field cut, decreased stength and decreased sitting balance, decreased standing balance, decreased postural control, hemiplegia and decreased balance strategies.  Prior to hospitalization, patient could complete ADLs and IADLs with independent .  Patient will benefit from skilled intervention to decrease level of assist with basic self-care skills prior to discharge home with care partner.  Anticipate patient will require 24 hour supervision and minimal physical assistance and follow up home health.  OT - End of Session Activity Tolerance: Decreased this session Endurance Deficit: Yes Endurance Deficit Description: multiple rest breaks OT Assessment Rehab Potential (ACUTE ONLY): Good OT Barriers to Discharge: Inaccessible home environment OT Barriers to Discharge Comments: 2nd floor bedroom OT Patient demonstrates impairments in the following area(s): Balance;Behavior;Endurance;Motor;Pain;Perception;Safety;Vision OT Basic ADL's Functional Problem(s): Grooming;Bathing;Dressing;Toileting OT Transfers Functional Problem(s):  Toilet;Tub/Shower OT Additional Impairment(s): Fuctional Use  of Upper Extremity OT Plan OT Intensity: Minimum of 1-2 x/day, 45 to 90 minutes OT Frequency: 5 out of 7 days OT Duration/Estimated Length of Stay: 2.5- 3 weeks OT Treatment/Interventions: Balance/vestibular training;Functional electrical stimulation;Self Care/advanced ADL retraining;UE/LE Coordination activities;Cognitive remediation/compensation;Functional mobility training;Neuromuscular re-education;Community reintegration;Wheelchair propulsion/positioning;Discharge planning;Therapeutic Activities;Therapeutic Exercise;Patient/family education;DME/adaptive equipment instruction;Psychosocial support;UE/LE Strength taining/ROM OT Self Feeding Anticipated Outcome(s): set up A OT Basic Self-Care Anticipated Outcome(s): S - min A OT Toileting Anticipated Outcome(s): min A OT Bathroom Transfers Anticipated Outcome(s): min A OT Recommendation Recommendations for Other Services: Neuropsych consult Patient destination: Home Follow Up Recommendations: Home health OT;24 hour supervision/assistance Equipment Recommended: To be determined   Skilled Therapeutic Intervention Upon entering the room, pt seated in recliner chair with no c/o pain this session. OT educated pt on OT purpose, POC, and goals with pt verbalizing understanding. Pt verbalized need for BM. Max A stand pivot transfer into wheelchair to the R. OT assisted pt into bathroom via wheelchair. Pt standing with strong lean to the L and total A transfer onto toilet. Pt sitting on toilet for 35 minutes but unable to have BM. Pt became diaphoretic and RN notified. BP taken with results of 158/114. Pt transferred to wheelchair with total A and returned to bed in same manner. Bed alarm activated and call bell within reach. RN present in room.   OT Evaluation Precautions/Restrictions  Precautions Precautions: Fall Precaution Comments: R eye ptosis  Restrictions Weight Bearing  Restrictions: No  Pain Pain Assessment Pain Scale: 0-10 Pain Score: 0-No pain Home Living/Prior Functioning Home Living Family/patient expects to be discharged to:: Private residence Living Arrangements: Spouse/significant other, Other relatives Available Help at Discharge: Family, Available 24 hours/day Type of Home: Other(Comment) Home Access: Stairs to enter Entrance Stairs-Number of Steps: 5-6 Entrance Stairs-Rails: Right Home Layout: Two level, Bed/bath upstairs Alternate Level Stairs-Rails: Right Bathroom Shower/Tub: Chiropodist: Standard  Lives With: Significant other, Family IADL History Education: Buyer, retail Prior Function Level of Independence: Independent with basic ADLs, Independent with homemaking with ambulation, Independent with transfers  Able to Take Stairs?: Yes Driving: No Vocation: Part time employment Comments: was able to walk with no AD Vision Baseline Vision/History: (visual deficits from CVA in Feb 2020) Vision Assessment?: Yes Alignment/Gaze Preference: Chin down;Head tilt Tracking/Visual Pursuits: Left eye does not track medially;Left eye does not track laterally Perception  Perception: Impaired Inattention/Neglect: Does not attend to left visual field Cognition Overall Cognitive Status: Within Functional Limits for tasks assessed Arousal/Alertness: Awake/alert Orientation Level: Person;Place;Situation Person: Oriented Place: Oriented Situation: Oriented Year: 2020 Month: August Day of Week: Correct Memory: Impaired Immediate Memory Recall: Blue;Sock;Bed Memory Recall Sock: Without Cue Memory Recall Blue: Without Cue Memory Recall Bed: Without Cue Sensation Sensation Light Touch: Impaired Detail Light Touch Impaired Details: Impaired LUE Coordination Gross Motor Movements are Fluid and Coordinated: No Fine Motor Movements are Fluid and Coordinated: No Coordination and Movement Description: L hemiplegia Motor   Motor Motor: Hemiplegia;Abnormal tone Motor - Skilled Clinical Observations: L sided hemiplegia and Tone in the LUE Mobility  Bed Mobility Bed Mobility: Rolling Right;Rolling Left;Sit to Supine;Supine to Sit Rolling Right: Moderate Assistance - Patient 50-74% Rolling Left: Minimal Assistance - Patient > 75% Supine to Sit: Moderate Assistance - Patient 50-74% Sit to Supine: Minimal Assistance - Patient > 75% Transfers Sit to Stand: Maximal Assistance - Patient 25-49% Stand to Sit: Maximal Assistance - Patient 25-49%  Trunk/Postural Assessment  Cervical Assessment Cervical Assessment: Exceptions to WFL(head tilt) Thoracic Assessment Thoracic Assessment: Exceptions to [facility] Lumbar Assessment  Lumbar Assessment: Exceptions to [facility] Postural Control Postural Control: Deficits on evaluation Righting Reactions: limited on L Protective Responses: delayed  Balance Balance Balance Assessed: Yes Static Sitting Balance Static Sitting - Balance Support: No upper extremity supported Static Sitting - Level of Assistance: 4: Min assist Dynamic Sitting Balance Dynamic Sitting - Balance Support: No upper extremity supported Dynamic Sitting - Level of Assistance: 3: Mod assist Static Standing Balance Static Standing - Balance Support: No upper extremity supported Static Standing - Level of Assistance: 2: Max assist Dynamic Standing Balance Dynamic Standing - Balance Support: No upper extremity supported Dynamic Standing - Level of Assistance: 2: Max assist Extremity/Trunk Assessment RUE Assessment RUE Assessment: Within Functional Limits LUE Assessment LUE Assessment: Exceptions to [facility] General Strength Comments: shoulder with trace movement and elbow,wrist, digits 2-/5     Refer to Care Plan for Long Term Goals  Recommendations for other services: Neuropsych   Discharge Criteria: Patient will be discharged from OT if patient refuses treatment 3 consecutive times without medical reason,  if treatment goals not met, if there is a change in medical status, if patient makes no progress towards goals or if patient is discharged from hospital.  The above assessment, treatment plan, treatment alternatives and goals were discussed and mutually agreed upon: by patient  Gypsy Decant 08/18/2018, 12:49 PM

## 2018-08-18 NOTE — Plan of Care (Signed)
Nutrition Education Note  RD consulted for nutrition education regarding pre-diabetes.  Lab Results  Component Value Date   HGBA1C 6.2 (H) 08/15/2018   Spoke with pt and pt's father at bedside. Pt reports that he currently has a good appetite. RD assisted pt in setting up lunch meal tray, and pt had completed ~75% of tray by end of RD visit and was still eating.  Pt reports that at home, he has been eating out a lot more due to [place]. Pt also reports he drinks "whatever is in the fridge" which may include water or soda. Pt states that he has not been watching what he is eating. Pt reports that he was working out with weights but was not engaging in cardiovascular workouts.  Pt's father at bedside reports he had a stroke about 9 years ago and that he has made a lot of changes in his diet and exercise to help prevent having another stroke. Pt's father is very supportive of pt and is stating, "we can do this together and keep each other accountable."  RD provided "Carbohydrate Counting for People with Diabetes" handout from the Academy of Nutrition and Dietetics. Discussed different food groups and their effects on blood sugar, emphasizing carbohydrate-containing foods. Provided list of carbohydrates and recommended serving sizes of common foods.  Discussed importance of controlled and consistent carbohydrate intake throughout the day. Provided examples of ways to balance meals/snacks and encouraged intake of high-fiber, whole grain complex carbohydrates. Teach back method used.  Provided examples on ways to decrease saturated fat intake in diet. Discouraged intake of processed foods and use of salt shaker. Teach back method used.  Expect good compliance.  Body mass index is 29.68 kg/m. Pt meets criteria for overweight based on current BMI.  Current diet order is Heart Healthy/Carb Modified, patient is consuming approximately 75% of meals at this time. Labs and medications reviewed. No further  nutrition interventions warranted at this time. RD contact information provided. If additional nutrition issues arise, please re-consult RD.   Gaynell Face, MS, RD, LDN Inpatient Clinical Dietitian Pager: [phone] Weekend/After Hours: [phone]
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
## 2018-08-18 NOTE — Evaluation (Signed)
Physical Therapy Assessment and Plan  Patient Details  Name: Todd Hood MRN: [id number] Date of Birth: 08-20-1975  PT Diagnosis: Abnormal posture, Abnormality of gait, Hemiplegia non-dominant, Hypertonia, Impaired sensation and Muscle weakness Rehab Potential: Good ELOS: 18-21 days   Today's Date: 08/18/2018 PT Individual Time: 0810-0905 AND 1650-1735 PT Individual Time Calculation (min): 55 min  And 45 min   Problem List:  Patient Active Problem List   Diagnosis Date Noted  . Right thalamic stroke ([place]) 08/17/2018  . Dyslipidemia   . Hypokalemia   . Hemiparesis affecting left side as late effect of stroke ([place])   . HTN (hypertension) 08/15/2018  . Prediabetes 08/15/2018  . Marijuana use 08/15/2018  . Acute ischemic stroke ([place]) 08/14/2018    Past Medical History:  Past Medical History:  Diagnosis Date  . CVA (cerebral vascular accident) ([place])   . Hypertension    Past Surgical History:  Past Surgical History:  Procedure Laterality Date  . BUBBLE STUDY  08/16/2018   Procedure: BUBBLE STUDY;  Surgeon: Lelon Perla, MD;  Location: [facility] ENDOSCOPY;  Service: Cardiovascular;;  . TEE WITHOUT CARDIOVERSION N/A 08/16/2018   Procedure: TRANSESOPHAGEAL ECHOCARDIOGRAM (TEE);  Surgeon: Lelon Perla, MD;  Location: [facility] ENDOSCOPY;  Service: Cardiovascular;  Laterality: N/A;    Assessment & Plan Clinical Impression: Patient is a7 year old male with history of HTN, R-CVA 2/20 with residual mild left-sided weakness and left peripheral vision loss; who was admitted on 08/14/2018 after a fall, htting the back of his head with left-sided weakness, left facial weakness, and dysarthria. History taken from chart review and patient. Patient had stopped taking his Plavix due to issues with dental pain.  CTA head neck/perfusion was negative for LVO ischemia and showed chronic right PCA occlusion with right-PCA territory encephalomalacia. MRI brain done revealing acute right thalamic, midbrain and  internal capsule infarcts.  UDS was positive for THC. BLE Dopplers negative for DVT.   He had 30-day event monitor earlier this year that was negative for A. fib.  Dr. Leonie Man felt that stroke was embolic due to unknown source and loop recorder recommended however patient declined this.  He had TEE on 08/16/2018 showing EF of 50-60% with intra-atrial septal aneurysm.  No shunting or thrombus noted.   Patient continues to be limited by left hemiparesis with right proptosis, left hand anonymous hemianopsia and left lateral lean with standing.    Patient transferred to CIR on 08/17/2018 .   Patient currently requires max with mobility secondary to muscle weakness, muscle joint tightness and muscle paralysis, decreased cardiorespiratoy endurance, impaired timing and sequencing, abnormal tone, unbalanced muscle activation, decreased coordination and decreased motor planning, decreased visual acuity, decreased visual perceptual skills, decreased visual motor skills and field cut, decreased attention to left and decreased sitting balance, decreased standing balance, decreased postural control, hemiplegia and decreased balance strategies.  Prior to hospitalization, patient was modified independent  with mobility and lived with   in a Other(Comment) home.  Home access is 5-6(pt is unsure)Stairs to enter.  Patient will benefit from skilled PT intervention to maximize safe functional mobility, minimize fall risk and decrease caregiver burden for planned discharge home with 24 hour assist.  Anticipate patient will benefit from follow up [facility] at discharge.  PT - End of Session Activity Tolerance: Tolerates 10 - 20 min activity with multiple rests Endurance Deficit: Yes PT Assessment Rehab Potential (ACUTE/IP ONLY): Good PT Barriers to Discharge: [place] home environment;Decreased caregiver support;Medical stability;Home environment access/layout;Insurance for SNF coverage;Lack of/limited family  support PT Patient  demonstrates impairments in the following area(s): Balance;Behavior;Edema;Endurance;Motor;Nutrition;Pain;Perception;Safety;Sensory;Skin Integrity PT Transfers Functional Problem(s): Bed Mobility;Bed to Chair;Car;Furniture;Floor PT Locomotion Functional Problem(s): Ambulation;Wheelchair Mobility;Stairs PT Plan PT Intensity: Minimum of 1-2 x/day ,45 to 90 minutes PT Frequency: 5 out of 7 days PT Duration Estimated Length of Stay: 18-21 days PT Treatment/Interventions: Ambulation/gait training;Balance/vestibular training;Cognitive remediation/compensation;Functional electrical stimulation;Community reintegration;DME/adaptive equipment instruction;Disease management/prevention;Discharge planning;Functional mobility training;Neuromuscular re-education;Pain management;Patient/family education;Splinting/orthotics;Skin care/wound management;Psychosocial support;Therapeutic Exercise;Therapeutic Activities;UE/LE Coordination activities;UE/LE Strength taining/ROM;Stair training;Wheelchair propulsion/positioning;Visual/perceptual remediation/compensation PT Transfers Anticipated Outcome(s): SUpervision assist with LRAD PT Locomotion Anticipated Outcome(s): CGA ambulation with LRAD. Mod I WC leve. PT Recommendation Recommendations for Other Services: Therapeutic Recreation consult Therapeutic Recreation Interventions: Stress management Follow Up Recommendations: Home health PT Patient destination: Home Equipment Recommended: Wheelchair (measurements);Wheelchair cushion (measurements);To be determined  Skilled Therapeutic Intervention Session 1  Pt received supine in bed and agreeable to PT. Supine>sit transfer with mod assist and moderate cues for safety . PT instructed patient in PT Evaluation and initiated treatment intervention; see below for results. PT educated patient in [place], rehab potential, rehab goals, and discharge recommendations. Gait training without AD in room x 17f as listed below. Patient  returned to room and left sitting in recliner with call bell in reach and all needs met.    Session 2.  Pt received supine in bed and agreeable to PT. Supine>sit transfer with mod assist at trunk and LLE  With moderate cues for attention to the LUE. PT assessed orthostatic vital signs. Supine: 141/105, HR 89. Sitting, 135/104, HR 111, mild orthostatic s/s with mild change after 2 min sitting EOB. PT attempted squat pivot transfer to the L, but unable to motor plan squat and required max assist as attempting to perform stand pivot transfer. Pt transported to rehab gym. Sit<>stand with RW x 5 with max progressing to mod assist and multimodal cues for improved LLE activation. pregait stepping R and L with RW and L hand orthotic with mod-max assist to prevent LLE buckling. Gait training with RW x 535f+1021fith max assist from PT to improve LLE stability and to improve R weight shift to advance the LLE. Patient returned to room and performed squat pivot transfer to the L with max assist, then left sitting in recliner with call bell in reach and all needs met.         PT Evaluation Precautions/Restrictions   fall . L field cut.  General   Vital SignsTherapy Vitals Temp: 98 F (36.7 C) Temp Source: Oral Pulse Rate: 74 Resp: 16 BP: (!) 144/103(RN notified) Patient Position (if appropriate): Lying Oxygen Therapy SpO2: 100 % O2 Device: Room Air Pain   denies Home Living/Prior Functioning Home Living Available Help at Discharge: Family;Available 24 hours/day Type of Home: Other(Comment) Home Access: Stairs to enter Entrance Stairs-Number of Steps: 5-6(pt is unsure) Entrance Stairs-Rails: Right Home Layout: Two level;Bed/bath upstairs Alternate Level Stairs-Rails: Right Bathroom Shower/Tub: TubChiropodisttandard Prior Function Level of Independence: Independent with basic ADLs;Independent with homemaking with ambulation;Independent with transfers  Able to Take  Stairs?: Yes Driving: No Vocation: Part time employment Comments: was able to walk with no AD Vision/Perception  Vision - Assessment Alignment/Gaze Preference: Chin down;Head tilt Tracking/Visual Pursuits: Left eye does not track medially;Left eye does not track laterally(unable to visualize R eye due to ptosis.) Perception Perception: Impaired Inattention/Neglect: Does not attend to left visual field  Cognition Overall Cognitive Status: Within Functional Limits for tasks assessed Arousal/Alertness: Awake/alert Orientation Level: Oriented X4 Memory: Impaired Safety/Judgment: Appears  intact Sensation Sensation Light Touch: Impaired Detail Light Touch Impaired Details: Impaired LUE Coordination Gross Motor Movements are Fluid and Coordinated: No Fine Motor Movements are Fluid and Coordinated: No Coordination and Movement Description: L hemiplegia Finger Nose Finger Test: unable to perform Heel Shin Test: unable to perform Motor  Motor Motor: Hemiplegia;Abnormal tone Motor - Skilled Clinical Observations: L sided hemiplegia and Tone in the LUE  Mobility Bed Mobility Bed Mobility: Rolling Right;Rolling Left;Sit to Supine;Supine to Sit Rolling Right: Moderate Assistance - Patient 50-74% Rolling Left: Minimal Assistance - Patient > 75% Supine to Sit: Moderate Assistance - Patient 50-74% Sit to Supine: Minimal Assistance - Patient > 75% Transfers Transfers: Sit to Stand;Stand to Sit;Stand Pivot Transfers Sit to Stand: Maximal Assistance - Patient 25-49% Stand to Sit: Maximal Assistance - Patient 25-49% Stand Pivot Transfers: Maximal Assistance - Patient 25 - 49% Stand Pivot Transfer Details: Tactile cues for placement;Verbal cues for sequencing;Verbal cues for technique;Verbal cues for precautions/safety;Manual facilitation for placement Transfer (Assistive device): None Locomotion  Gait Ambulation: Yes Gait Assistance: Maximal Assistance - Patient 25-49% Gait Distance  (Feet): 10 Feet Assistive device: None Gait Assistance Details: Manual facilitation for weight bearing;Manual facilitation for weight shifting;Manual facilitation for placement;Verbal cues for precautions/safety;Tactile cues for placement;Tactile cues for weight shifting;Verbal cues for sequencing;Visual cues/gestures for sequencing Gait Gait: Yes Gait Pattern: Impaired Gait Pattern: Left flexed knee in stance;Lateral hip instability;Lateral trunk lean to right;Left foot flat Stairs / Additional Locomotion Stairs: No Wheelchair Mobility Wheelchair Mobility: Yes Wheelchair Assistance: Minimal assistance - Patient >75% Wheelchair Propulsion: Right upper extremity;Right lower extremity Wheelchair Parts Management: Needs assistance Distance: 150  Trunk/Postural Assessment  Cervical Assessment Cervical Assessment: Exceptions to WFL(head tilt and L rotation to compensate for L field cut.) Thoracic Assessment Thoracic Assessment: Exceptions to [facility] Lumbar Assessment Lumbar Assessment: Exceptions to [facility] Postural Control Postural Control: Deficits on evaluation Righting Reactions: limited on L Protective Responses: delayed  Balance Balance Balance Assessed: Yes Static Sitting Balance Static Sitting - Balance Support: No upper extremity supported Static Sitting - Level of Assistance: 4: Min assist Dynamic Sitting Balance Dynamic Sitting - Balance Support: No upper extremity supported Dynamic Sitting - Level of Assistance: 3: Mod assist Static Standing Balance Static Standing - Balance Support: No upper extremity supported Static Standing - Level of Assistance: 2: Max assist Dynamic Standing Balance Dynamic Standing - Balance Support: No upper extremity supported Dynamic Standing - Level of Assistance: 2: Max assist Extremity Assessment      RLE Assessment RLE Assessment: Exceptions to [facility] General Strength Comments: 5/5 grossly LLE Assessment LLE Assessment: Exceptions to  [facility] General Strength Comments: delayed activation. grossly 4-/5 to 4/4 proximalto distal except hip flexion and ankle DF 3/5    Refer to Care Plan for Long Term Goals  Recommendations for other services: Therapeutic Recreation  Stress management  Discharge Criteria: Patient will be discharged from PT if patient refuses treatment 3 consecutive times without medical reason, if treatment goals not met, if there is a change in medical status, if patient makes no progress towards goals or if patient is discharged from hospital.  The above assessment, treatment plan, treatment alternatives and goals were discussed and mutually agreed upon: by patient  [name] 08/18/2018, 9:09 AM

## 2018-08-19 ENCOUNTER — Inpatient Hospital Stay (HOSPITAL_COMMUNITY): Payer: Self-pay | Admitting: Occupational Therapy

## 2018-08-19 ENCOUNTER — Inpatient Hospital Stay (HOSPITAL_COMMUNITY): Payer: Self-pay | Admitting: Physical Therapy

## 2018-08-19 MED ORDER — METOPROLOL TARTRATE 12.5 MG HALF TABLET
12.5000 mg | ORAL_TABLET | Freq: Two times a day (BID) | ORAL | Status: DC
  Administered 2018-08-19 – 2018-08-29 (×21): 12.5 mg via ORAL
  Filled 2018-08-19 (×21): qty 1

## 2018-08-19 NOTE — Plan of Care (Signed)
  Problem: RH BOWEL ELIMINATION Goal: RH STG MANAGE BOWEL W/MEDICATION W/ASSISTANCE Description: STG Manage Bowel with Medication with  [place]. Outcome: Progressing   Problem: RH BLADDER ELIMINATION Goal: RH STG MANAGE BLADDER WITH ASSISTANCE Description: STG Manage Bladder With Min Assistance Outcome: Progressing Goal: RH STG MANAGE BLADDER WITH EQUIPMENT WITH ASSISTANCE Description: STG Manage Bladder With Equipment With  Min Assistance Outcome: Progressing   Problem: RH PAIN MANAGEMENT Goal: RH STG PAIN MANAGED AT OR BELOW PT'S PAIN GOAL Description: Pt will be free of pain Outcome: Progressing   Problem: RH Vision Goal: RH LTG Vision (Specify) Outcome: Progressing

## 2018-08-19 NOTE — Progress Notes (Signed)
Physical Therapy Session Note  Patient Details  Name: Jakeim Sedore MRN: [id number] Date of Birth: [birth year]  Today's Date: 08/19/2018 PT Individual Time: 6767-2094 PT Individual Time Calculation (min): 70 min   Short Term Goals: Week 1:  PT Short Term Goal 1 (Week 1): Pt will perform bed mobility with min Assist PT Short Term Goal 2 (Week 1): Pt will transfer to [facility] with mod assist consistently PT Short Term Goal 3 (Week 1): Pt will ambulate 69ft with mod assist and LRAD PT Short Term Goal 4 (Week 1): Pt will propell WC 140ft with supervision A PT Short Term Goal 5 (Week 1): Pt will initiate stair training.  Skilled Therapeutic Interventions/Progress Updates:    pt rec'd in recliner, ready for therapy.  Attempt squat pivot transfers but pt with difficulty motor planning and performs stand pivot transfers each attempt with mod/max A.  Standing balance and NMR for Lt LE strength and control with mini squats, side stepping each direction, pregait stepping forward/backwards all with manual facilitation for posture, assist to prevent Lt knee buckling, mod A for wt shifts and Max A to advance Lt LE sideways.  Gait at railing in hallway 2 x 25' with mod A for wt shifts and max A for Lt LE advancement.  kinetron for bilat LE strengthening and Lt NMR 2 x 3 minutes with pt pleased he can move Lt LE.  W/c mobility with min/mod A hemi technique x 50'.  Pt left in recliner with alarm set, needs at hand.  Therapy Documentation Precautions:  Precautions Precautions: Fall Precaution Comments: R eye ptosis  Restrictions Weight Bearing Restrictions: No Pain: No c/o pain   Therapy/Group: Individual Therapy  Amedee Cerrone 08/19/2018, 2:15 PM

## 2018-08-19 NOTE — Progress Notes (Signed)
[place] PHYSICAL MEDICINE & REHABILITATION PROGRESS NOTE   Subjective/Complaints: Up in bed. Finished breakfast. No problems over night  ROS: Patient denies fever, rash, sore throat, blurred vision, nausea, vomiting, diarrhea, cough, shortness of breath or chest pain, joint or back pain, headache, or mood change.   Objective:   No results found. Recent Labs    08/18/18 0554  WBC 7.0  HGB 15.8  HCT 43.2  PLT 200   Recent Labs    08/17/18 0435 08/18/18 0554  NA 140 137  K 3.2* 3.8  CL 106 106  CO2 22 22  GLUCOSE 118* 128*  BUN 10 14  CREATININE 0.85 0.99  CALCIUM 9.0 8.8*    Intake/Output Summary (Last 24 hours) at 08/19/2018 1015 Last data filed at 08/18/2018 2100 Gross per 24 hour  Intake 720 ml  Output 825 ml  Net -105 ml     Physical Exam: Vital Signs Blood pressure (!) 152/106, pulse 70, temperature 98.2 F (36.8 C), temperature source Oral, resp. rate 20, height 5\' 9"  (1.753 m), weight 99.5 kg, SpO2 99 %.  Constitutional: No distress . Vital signs reviewed. HEENT: EOMI, oral membranes moist Neck: supple Cardiovascular: RRR without murmur. No JVD    Respiratory: CTA Bilaterally without wheezes or rales. Normal effort    GI: BS +, non-tender, non-distended  Musculoskeletal:     Comments: No edema or tenderness in extremities  Neurological: He is alert. Right lid ptosis. Ophthalmoplegia left. Left HH Motor: RUE/RLE: 5/5 proximal to distal LUE: Should abduction 1 to 1+/5, elbow flex/ext 2-/5, hand grip tr to 1/5.  LLE: HF, KE 3-/5   ADF/APF 0/5--no change Sensation decreased to LT, sl hypersensitive still Skin: Skin is warm and dry.  Psychiatric: pleasant and cooperative   Assessment/Plan: 1. Functional deficits secondary to Right thalamic infarct which require 3+ hours per day of interdisciplinary therapy in a comprehensive inpatient rehab setting.  Physiatrist is providing close team supervision and 24 hour management of active medical problems  listed below.  Physiatrist and rehab team continue to assess barriers to discharge/monitor patient progress toward functional and medical goals  Care Tool:  Bathing  Bathing activity did not occur: Refused           Bathing assist       Upper Body Dressing/Undressing Upper body dressing   What is the patient wearing?: Hospital gown only    Upper body assist Assist Level: Minimal Assistance - Patient > 75%    Lower Body Dressing/Undressing Lower body dressing    Lower body dressing activity did not occur: Refused What is the patient wearing?: Underwear/pull up     Lower body assist Assist for lower body dressing: Moderate Assistance - Patient 50 - 74%     Toileting Toileting    Toileting assist Assist for toileting: Total Assistance - Patient < 25%     Transfers Chair/bed transfer  Transfers assist  Chair/bed transfer activity did not occur: Safety/medical concerns  Chair/bed transfer assist level: Moderate Assistance - Patient 50 - 74%     Locomotion Ambulation   Ambulation assist      Assist level: Maximal Assistance - Patient 25 - 49% Assistive device: No Device Max distance: 5   Walk 10 feet activity   Assist  Walk 10 feet activity did not occur: Safety/medical concerns        Walk 50 feet activity   Assist Walk 50 feet with 2 turns activity did not occur: Safety/medical concerns  Walk 150 feet activity   Assist Walk 150 feet activity did not occur: Safety/medical concerns         Walk 10 feet on uneven surface  activity   Assist Walk 10 feet on uneven surfaces activity did not occur: Safety/medical concerns         Wheelchair     Assist   Type of Wheelchair: Manual    Wheelchair assist level: Minimal Assistance - Patient > 75% Max wheelchair distance: 150    Wheelchair 50 feet with 2 turns activity    Assist        Assist Level: Minimal Assistance - Patient > 75%   Wheelchair 150 feet  activity     Assist     Assist Level: Minimal Assistance - Patient > 75%    Medical Problem List and Plan: 1. Left hemiparesis with right proptosis, left hand homonymous hemianopsia and left lateral lean secondary to  acute right thalamic, midbrain and internal capsule infarcts.             Admit to CIR. 2.  Antithrombotics: -DVT/anticoagulation:  Pharmaceutical: Lovenox             -antiplatelet therapy: ASA and Plavix x3 weeks followed by Plavix alone 3. Pain Management: N/A 4. Mood: LCSW to follow for evaluation and support             -antipsychotic agents: N/A 5. Neuropsych: This patient is ?fully capable of making decisions on his own behalf. 6. Skin/Wound Care: Routine pressure relief measures 7. Fluids/Electrolytes/Nutrition: Monitor I/Os.                I personally reviewed the patient's labs today.   8.  Prediabetes: Hemoglobin A1c is-6.2. Carb modified restrictions added to diet.                -folow for pattern  -need to order qam cbg's 9.  Hypokalemia: potassium 3.8, continue K+ supp, recheck monday 10.  Dyslipidemia: On Lipitor 11.  HTN: Permissive hypertension < 220/120 for today/tomorrow and then start meds  -begin metoprolol 12.5 mg bid 12. Substance abuse: Counsel    LOS: 2 days A FACE TO FACE EVALUATION WAS PERFORMED  [name] 08/19/2018, 10:15 AM

## 2018-08-19 NOTE — Progress Notes (Signed)
Occupational Therapy Session Note  Patient Details  Name: Todd Hood MRN: [id number] Date of Birth: 1975-08-17  Today's Date: 08/19/2018 OT Individual Time: 7517-0017 OT Individual Time Calculation (min): 57 min   Short Term Goals: Week 1:  OT Short Term Goal 1 (Week 1): PT will perform mod A standing balance during LB clothing management. OT Short Term Goal 2 (Week 1): Pt will perform UB dressing with mod A. OT Short Term Goal 3 (Week 1): Pt will perform toilet transfer with mod A.  Skilled Therapeutic Interventions/Progress Updates:    Pt greeted in bed with no c/o pain. Wanting to get washed up. Supine<sit completed with Mod A for trunk elevation. Pt with Lt lean while sitting but no overt LOB during UB bathing EOB. OT facilitated HOH for using L UE to wash Rt side. Pt with a little activation in biceps, triceps, and IRs. Also weak hand grasp noted. Vcs required for attention to Lt side during bathing tasks. He was able to lift L LE into figure 4 and wash his foot given vcs. After OT donned gripper sock over toes, he was able to bring the rest of the fabric down over his foot. Increased difficultly with washing/dressing R LE. Pt had 1 major posterior and lateral LOB each when lifting this limb into figure 4. OT washed his Rt foot and donned gripper sock for safety. Sit<stand in [place] completed with steady assist. Had pt focus on midline and using Rt hand to maintain Lt hand grasp on bar for weightbearing. OT completed perihygiene at this time and also donned brief. He then brushed his teeth while semi perched and standing supported in [place]. Noted active Lt knee buckling during. He would sit with part of his buttocks off of the [place] paddles and required vcs for readjusting towards Rt side for safety. Unable to see well enough in mirror to gauge his own alignment. Mod A for standing balance in [place] while R UE was engaged functionally and OT facilitated inclusion of Lt as gross stabilizer. Pt then  requested to use the bathroom. [place] transfer to elevated toilet completed with Max A for clothing mgt. He was left to continue voiding bowels with NT present. Tx focus placed on midline orientation, sitting/standing balance, Lt attention, and visual scanning.   No c/o dizziness during tx.   Therapy Documentation Precautions:  Precautions Precautions: Fall Precaution Comments: R eye ptosis  Restrictions Weight Bearing Restrictions: No Pain: Pain Assessment Pain Scale: 0-10 Pain Score: 0-No pain ADL:       Therapy/Group: Individual Therapy  Martia Dalby A Justyne Roell 08/19/2018, 12:13 PM

## 2018-08-19 NOTE — Progress Notes (Signed)
Physical Therapy Session Note  Patient Details  Name: Todd Hood MRN: [id number] Date of Birth: 06/26/1975  Today's Date: 08/19/2018 PT Individual Time: 1420-1535 PT Individual Time Calculation (min): 75 min   Short Term Goals: Week 1:  PT Short Term Goal 1 (Week 1): Pt will perform bed mobility with min Assist PT Short Term Goal 2 (Week 1): Pt will transfer to [facility] with mod assist consistently PT Short Term Goal 3 (Week 1): Pt will ambulate 44ft with mod assist and LRAD PT Short Term Goal 4 (Week 1): Pt will propell WC 112ft with supervision A PT Short Term Goal 5 (Week 1): Pt will initiate stair training.  Skilled Therapeutic Interventions/Progress Updates: Pt presented in recliner requesting brief change as noted stated incontinent episode. Pt performed Stedy transfer to bathroom where PTA provided peri-care and changed brief/gown total A. Pt was able to maintain midline both while sitting in [place] and toilet with min cues. Pt transferred to w/c and pt propelled approx 178ft with supervision and verbal cues for object avoidance on L. Pt transported remaining distance to ortho gym and participated in standing frame activities for L NMR and increased wt bearing on L. Pt was able to perform L quad contraction to maintain full extension. Pt noted to be diaphoretic, BP checked 155/113 HR 80, pt stating only feeling fatigued. Pt transported to day room and performed squat pivot to mat modA. Performed STS from mat minA with pt performed L forward/backward stepping for NMR and coordination. Pt returned to w/c via squat pivot minA and transported to NuStep. Performed squat pivot to NuStep and participated in NuStep x 5 min L1 for reciprocal activity and general conditioning. Pt noted to require frequent re-direction due to being easily distracted. Pt returned to w/c in same manner as prior and pt transported back to room. Pt transported back to room and performed squat pivot to bed modA. Required modA for  LLE management to return to supine. Pt left in bed with bed alarm on, call bell within reach and father present.      Therapy Documentation Precautions:  Precautions Precautions: Fall Precaution Comments: R eye ptosis  Restrictions Weight Bearing Restrictions: No General:   Vital Signs: Therapy Vitals Temp: 98.3 F (36.8 C) Temp Source: Oral Pulse Rate: 78 Resp: 18 BP: (!) 145/116 Patient Position (if appropriate): Lying Oxygen Therapy SpO2: 100 % O2 Device: Room Air   Therapy/Group: Individual Therapy  Rhiannon Sassaman  Haden Suder, PTA  08/19/2018, 4:48 PM

## 2018-08-19 NOTE — IPOC Note (Signed)
Overall Plan of Care [facility](IPOC) Patient Details Name: Todd QuanJoel Hood MRN: [id number] DOB: 06/08/1975  Admitting Diagnosis: Right thalamic stroke [facility](HCC)  Hospital Problems: Principal Problem:   Right thalamic stroke [facility](HCC)     Functional Problem List: Nursing Bladder, Bowel, Endurance, Medication Management, Motor, Nutrition, Pain, Safety, Skin Integrity  PT Balance, Behavior, Edema, Endurance, Motor, Nutrition, Pain, Perception, Safety, Sensory, Skin Integrity  OT Balance, Behavior, Endurance, Motor, Pain, Perception, Safety, Vision  SLP    TR         Basic ADL's: OT Grooming, Bathing, Dressing, Toileting     Advanced  ADL's: OT       Transfers: PT Bed Mobility, Bed to Chair, Car, [employer] Corporation[employer]urniture, [employer] [place]  OT Toilet, Tub/Shower     Locomotion: PT Ambulation, Psychologist, prison and probation servicesWheelchair Mobility, Stairs     Additional Impairments: OT Fuctional Use of Upper Extremity  SLP None      TR      Anticipated Outcomes Item Anticipated Outcome  Self Feeding set up A  Swallowing      Basic self-care  S - min A  Toileting  min A   Bathroom Transfers min A  Bowel/Bladder  patient will be continent of bowel and bladder  Transfers  SUpervision assist with LRAD  Locomotion  CGA ambulation with LRAD. Mod I WC leve.  Communication     Cognition     Pain  pain less than or equal to 4/10 with min assist  Safety/Judgment  patient will be free from falls/injury and making appropriate safety decisions   Therapy Plan: PT Intensity: Minimum of 1-2 x/day ,45 to 90 minutes PT Frequency: 5 out of 7 days PT Duration Estimated Length of Stay: 18-21 days OT Intensity: Minimum of 1-2 x/day, 45 to 90 minutes OT Frequency: 5 out of 7 days OT Duration/Estimated Length of Stay: 2.5- 3 weeks     Due to the current state of emergency, patients may not be receiving their 3-hours of Medicare-mandated therapy.   Team Interventions: Nursing Interventions Patient/Family Education, Bladder Management, Bowel  Management, Disease Management/Prevention, Pain Management, Medication Management, Skin Care/Wound Management, Discharge Planning  PT interventions Ambulation/gait training, Balance/vestibular training, Cognitive remediation/compensation, Functional electrical stimulation, Community reintegration, DME/adaptive equipment instruction, Disease management/prevention, Discharge planning, Functional mobility training, Neuromuscular re-education, Pain management, Patient/family education, Splinting/orthotics, Skin care/wound management, Psychosocial support, Therapeutic Exercise, Therapeutic Activities, UE/LE Coordination activities, UE/LE Strength taining/ROM, Stair training, Wheelchair propulsion/positioning, Visual/perceptual remediation/compensation  OT Interventions Balance/vestibular training, Functional electrical stimulation, Self Care/advanced ADL retraining, UE/LE Coordination activities, Cognitive remediation/compensation, Functional mobility training, Neuromuscular re-education, Community reintegration, Wheelchair propulsion/positioning, Discharge planning, Therapeutic Activities, Therapeutic Exercise, Patient/family education, DME/adaptive equipment instruction, Psychosocial support, UE/LE Strength taining/ROM  SLP Interventions    TR Interventions    SW/CM Interventions Discharge Planning, Psychosocial Support, Patient/Family Education   Barriers to Discharge MD  Medical stability  Nursing      PT Inaccessible home environment, Decreased caregiver support, Medical stability, Home environment Best boyaccess/layout, Insurance for SNF coverage, Lack of/limited family support    OT Inaccessible home environment 2nd floor bedroom  SLP      SW Medication compliance, Other (comments) non-compliant with meds and unisured   Team Discharge Planning: Destination: PT-Home ,OT- Home , SLP-Home Projected Follow-up: PT-Home health PT, OT-  Home health OT, 24 hour supervision/assistance, SLP-None Projected  Equipment Needs: PT-Wheelchair (measurements), Wheelchair cushion (measurements), To be determined, OT- To be determined, SLP-None recommended by SLP Equipment Details: PT- , OT-  Patient/family involved in discharge planning: PT- Patient,  OT-Patient, SLP-Patient  MD  ELOS: 18-21 days Medical Rehab Prognosis:  Excellent Assessment: The patient has been admitted for CIR therapies with the diagnosis of right thalamic infarct. The team will be addressing functional mobility, strength, stamina, balance, safety, adaptive techniques and equipment, self-care, bowel and bladder mgt, patient and caregiver education, NMR, orthotics, cognition, communication, community reentry. Goals have been set at supervision to min assist with self-care and supervision with transfers, cga with ambulation and mod I with w/c mobiltiy  Due to the current state of emergency, patients may not be receiving their 3 hours per day of Medicare-mandated therapy.    Meredith Staggers, MD, FAAPMR      See Team Conference Notes for weekly updates to the plan of care

## 2018-08-20 ENCOUNTER — Inpatient Hospital Stay (HOSPITAL_COMMUNITY): Payer: Self-pay | Admitting: Occupational Therapy

## 2018-08-20 ENCOUNTER — Inpatient Hospital Stay (HOSPITAL_COMMUNITY): Payer: Self-pay | Admitting: Physical Therapy

## 2018-08-20 LAB — ANTIPHOSPHOLIPID SYNDROME EVAL, BLD
Anticardiolipin IgA: <9 APL U/mL (ref 0–11)
Anticardiolipin IgG: <9 GPL U/mL (ref 0–14)
Anticardiolipin IgM: <9 MPL U/mL (ref 0–12)
DRVVT: 38.2 s (ref 0.0–47.0)
PTT Lupus Anticoagulant: 32.1 s (ref 0.0–51.9)
Phosphatydalserine, IgA: 2 APS IgA (ref 0–20)
Phosphatydalserine, IgG: 15 GPS IgG — ABNORMAL HIGH (ref 0–11)
Phosphatydalserine, IgM: 13 MPS IgM (ref 0–25)

## 2018-08-20 NOTE — Progress Notes (Signed)
[place] PHYSICAL MEDICINE & REHABILITATION PROGRESS NOTE   Subjective/Complaints: Lying in bed. Slept last night. No new complaints  ROS: Patient denies fever, rash, sore throat, blurred vision, nausea, vomiting, diarrhea, cough, shortness of breath or chest pain, joint or back pain, headache, or mood change.    Objective:   No results found. Recent Labs    08/18/18 0554  WBC 7.0  HGB 15.8  HCT 43.2  PLT 200   Recent Labs    08/18/18 0554  NA 137  K 3.8  CL 106  CO2 22  GLUCOSE 128*  BUN 14  CREATININE 0.99  CALCIUM 8.8*    Intake/Output Summary (Last 24 hours) at 08/20/2018 0946 Last data filed at 08/20/2018 5456 Gross per 24 hour  Intake 684 ml  Output 1250 ml  Net -566 ml     Physical Exam: Vital Signs Blood pressure (!) 131/100, pulse 64, temperature 98.3 F (36.8 C), resp. rate 20, height 5\' 9"  (1.753 m), weight 99.5 kg, SpO2 99 %.  Constitutional: No distress . Vital signs reviewed. HEENT: EOMI, oral membranes moist Neck: supple Cardiovascular: RRR without murmur. No JVD    Respiratory: CTA Bilaterally without wheezes or rales. Normal effort    GI: BS +, non-tender, non-distended  Musculoskeletal:     Comments: No edema or tenderness in extremities  Neurological: He is alert. Right lid ptosis.  . Left HH Motor: RUE/RLE: 5/5 proximal to distal LUE: Should abduction 1 to 1+/5, elbow flex/ext 2-/5, hand grip tr to 1/5.  LLE: HF, KE 3-/5   ADF/APF 0/5- significant spastic left hemiparesis Sensation decreased to LT but hypersensitive Skin: Skin is warm and dry.  Psychiatric: pleasant and cooperative   Assessment/Plan: 1. Functional deficits secondary to Right thalamic infarct which require 3+ hours per day of interdisciplinary therapy in a comprehensive inpatient rehab setting.  Physiatrist is providing close team supervision and 24 hour management of active medical problems listed below.  Physiatrist and rehab team continue to assess barriers to  discharge/monitor patient progress toward functional and medical goals  Care Tool:  Bathing  Bathing activity did not occur: Refused Body parts bathed by patient: Chest, Abdomen, Front perineal area, Right upper leg, Left upper leg, Left lower leg, Face   Body parts bathed by helper: Right arm, Left arm, Buttocks, Right lower leg     Bathing assist Assist Level: Maximal Assistance - Patient 24 - 49%(Stedy used for sit<stand)     Upper Body Dressing/Undressing Upper body dressing   What is the patient wearing?: Hospital gown only    Upper body assist Assist Level: Minimal Assistance - Patient > 75%    Lower Body Dressing/Undressing Lower body dressing    Lower body dressing activity did not occur: Refused What is the patient wearing?: Incontinence brief     Lower body assist Assist for lower body dressing: Maximal Assistance - Patient 25 - 49%     Toileting Toileting    Toileting assist Assist for toileting: Total Assistance - Patient < 25%     Transfers Chair/bed transfer  Transfers assist  Chair/bed transfer activity did not occur: Safety/medical concerns  Chair/bed transfer assist level: Moderate Assistance - Patient 50 - 74%     Locomotion Ambulation   Ambulation assist      Assist level: Maximal Assistance - Patient 25 - 49% Assistive device: No Device Max distance: 5   Walk 10 feet activity   Assist  Walk 10 feet activity did not occur: Safety/medical concerns  Walk 50 feet activity   Assist Walk 50 feet with 2 turns activity did not occur: Safety/medical concerns         Walk 150 feet activity   Assist Walk 150 feet activity did not occur: Safety/medical concerns         Walk 10 feet on uneven surface  activity   Assist Walk 10 feet on uneven surfaces activity did not occur: Safety/medical concerns         Wheelchair     Assist   Type of Wheelchair: Manual    Wheelchair assist level: Minimal Assistance  - Patient > 75% Max wheelchair distance: 150    Wheelchair 50 feet with 2 turns activity    Assist        Assist Level: Minimal Assistance - Patient > 75%   Wheelchair 150 feet activity     Assist     Assist Level: Minimal Assistance - Patient > 75%    Medical Problem List and Plan: 1. Left hemiparesis with right proptosis, left hand homonymous hemianopsia and left lateral lean secondary to  acute right thalamic, midbrain and internal capsule infarcts.             -Continue CIR therapies including PT, OT, and SLP . 2.  Antithrombotics: -DVT/anticoagulation:  Pharmaceutical: Lovenox             -antiplatelet therapy: ASA and Plavix x3 weeks followed by Plavix alone 3. Pain Management: N/A 4. Mood: LCSW to follow for evaluation and support             -antipsychotic agents: N/A 5. Neuropsych: This patient is ?fully capable of making decisions on his own behalf. 6. Skin/Wound Care: Routine pressure relief measures 7. Fluids/Electrolytes/Nutrition: Monitor I/Os.                I personally reviewed the patient's labs today.   8.  Prediabetes: Hemoglobin A1c is-6.2. Carb modified restrictions added to diet.                -follow for pattern  -requested am cbg's and none today 9.  Hypokalemia: potassium 3.8, continue K+ supp, recheck monday 10.  Dyslipidemia: On Lipitor 11.  HTN: Permissive hypertension, bp's remain uncontrolled  -began metoprolol 12.5 mg bid 8/7---titrate further as needed  12. Substance abuse: Counsel    LOS: 3 days A FACE TO FACE EVALUATION WAS PERFORMED  Ranelle OysterZachary T Swartz 08/20/2018, 9:46 AM

## 2018-08-20 NOTE — Progress Notes (Signed)
Occupational Therapy Session Note  Patient Details  Name: Todd Hood MRN: [id number] Date of Birth: February 13, 1975  Today's Date: 08/20/2018 OT Individual Time: 0930-1045 and 1300-1400 OT Individual Time Calculation (min): 75 min and 60 min   Short Term Goals: Week 1:  OT Short Term Goal 1 (Week 1): PT will perform mod A standing balance during LB clothing management. OT Short Term Goal 2 (Week 1): Pt will perform UB dressing with mod A. OT Short Term Goal 3 (Week 1): Pt will perform toilet transfer with mod A.  Skilled Therapeutic Interventions/Progress Updates:    Session 1: Upon entering the room, pt seated in recliner chair and agreeable to OT intervention. Pt declined shower this session. He was able to call family member and requesting clothing and personal care items while this therapist is in the room. Pt donning pants with max A and use of figure four position. Pt standing with max A for standing balance while therapist pulls pants over B hips. Pt transferred into wheelchair with mod A stand pivot transfer towards the R. Pt seated in wheelchair at sink for grooming tasks at sink with supervision and min cuing for technique.  Pt propelled wheelchair 100' with hemiplegic technique and min A to not hit objects on L side. Pt standing for 13 minutes during table top activity. Pt needing min - mod A for standing balance with L knee blocked and L UE placed into weight bearing position. Pt needing mod cuing for upright posture and to return to midline. AAROM/PROM for shoulder and elbow and digits of L UE this session. Pt returning to room at end of session in same manner and transferred into bed with max A stand pivot transfer to the L. Sit >supine with mod A for LEs. Call bell and all needed items within reach. Bed alarm activated.   Session 2: Upon entering the room, pt supine in bed with father present in room. Pt with no c/o pain. Lunch tray in front of pt with him needing assistance to cut food  and open containers. Pt able to feed self with R UE. Pt performed supine >sit with mod A to EOB. Pt donning pull over shirt with mod A and mod cuing for hemiplegic technique. Stand pivot transfer to wheelchair with mod A. Pt engaged in dynavision task while seated with focus on visual scanning and engaging L UE to reach for lights with assistance from therapist. Pt engaged in sit <> stand x 7 reps with mod A overall with pt standing for 1 minute each time. Therapist positioned in front of pt and mirror placed for visual feedback. Pt propelled wheelchair back to room with hemiplegic technique and returned to bed in same manner as above. Call bell and all needed items within reach. Bed alarm activated and caregiver present in room.   Therapy Documentation Precautions:  Precautions Precautions: Fall Precaution Comments: R eye ptosis  Restrictions Weight Bearing Restrictions: No   Pain: Pain Assessment Pain Scale: 0-10 Pain Score: 0-No pain   Therapy/Group: Individual Therapy  Gypsy Decant 08/20/2018, 12:29 PM

## 2018-08-20 NOTE — Plan of Care (Signed)
  Problem: RH BOWEL ELIMINATION Goal: RH STG MANAGE BOWEL WITH ASSISTANCE Description: STG Manage Bowel with min Assistance. Outcome: Progressing Goal: RH STG MANAGE BOWEL W/MEDICATION W/ASSISTANCE Description: STG Manage Bowel with Medication with  [place]. Outcome: Progressing   Problem: RH BLADDER ELIMINATION Goal: RH STG MANAGE BLADDER WITH ASSISTANCE Description: STG Manage Bladder With Min Assistance Outcome: Progressing Goal: RH STG MANAGE BLADDER WITH EQUIPMENT WITH ASSISTANCE Description: STG Manage Bladder With Equipment With  Min Assistance Outcome: Progressing   Problem: RH PAIN MANAGEMENT Goal: RH STG PAIN MANAGED AT OR BELOW PT'S PAIN GOAL Description: Pt will be free of pain Outcome: Progressing

## 2018-08-20 NOTE — Progress Notes (Signed)
Physical Therapy Session Note  Patient Details  Name: Todd Hood MRN: [id number] Date of Birth: 04-Nov-1975  Today's Date: 08/20/2018 PT Individual Time: 2641-5830 PT Individual Time Calculation (min): 40 min   Short Term Goals: Week 1:  PT Short Term Goal 1 (Week 1): Pt will perform bed mobility with min Assist PT Short Term Goal 2 (Week 1): Pt will transfer to [facility] with mod assist consistently PT Short Term Goal 3 (Week 1): Pt will ambulate 60ft with mod assist and LRAD PT Short Term Goal 4 (Week 1): Pt will propell WC 176ft with supervision A PT Short Term Goal 5 (Week 1): Pt will initiate stair training.  Skilled Therapeutic Interventions/Progress Updates:    Pt received supine in bed and agreeable to therapy session. Supine>sit, HOB partially elevated and using bedrails, with mod assist for L LE management and trunk upright - max cuing for sequencing. Donned B shoes max assist. Squat pivot transfer EOB>w/c with mod assist for pivoting hips as pt attempting full stand pivot transfer.  Transported to/from gym in w/c. Squat pivot w/c>EOM with min assist for pivoting hips and L LE management. Sit>supine with mod assist for trunk descent and L LE management. Performed supine L LE NMR via PNF D2 flexion/extension reversals with emphasis on hip/knee flexion with abduction as well as heel slides focusing on hip/knee flexion - min assist for heel slides and max assist for PNF pattern movement. Supine>sit with mod assist for LE management and trunk upright. Squat pivot EOM>w/c with mod assist for lifting/pivoting hips and manual facilitation for increased anterior trunk lean. Transported back to room in w/c and pt requesting to return to bed. Squat pivot w/c>EOB with mod assist and sit>supine with min assist. Doffed shoes total assist. Pt left supine in bed with needs in reach and bed alarm on.  Therapy Documentation Precautions:  Precautions Precautions: Fall Precaution Comments: R eye ptosis   Restrictions Weight Bearing Restrictions: No  Pain: Denies pain during session.   Therapy/Group: Individual Therapy  Tawana Scale, PT, DPT 08/20/2018, 3:33 PM

## 2018-08-21 MED ORDER — SORBITOL 70 % SOLN
60.0000 mL | Status: AC
  Administered 2018-08-21: 12:00:00 60 mL via ORAL
  Filled 2018-08-21: qty 60

## 2018-08-21 NOTE — Progress Notes (Signed)
Pt drank prune juice and Miralax approx 1430. Pt has attempted on toilet x 2.. unsuccessful, suppository given. Will monitor for effectiveness.  Erie Noe, RN

## 2018-08-21 NOTE — Plan of Care (Signed)
  Problem: RH BOWEL ELIMINATION Goal: RH STG MANAGE BOWEL W/MEDICATION W/ASSISTANCE Description: STG Manage Bowel with Medication with  [place]. Outcome: Progressing   Problem: RH BLADDER ELIMINATION Goal: RH STG MANAGE BLADDER WITH ASSISTANCE Description: STG Manage Bladder With Min Assistance Outcome: Progressing Goal: RH STG MANAGE BLADDER WITH EQUIPMENT WITH ASSISTANCE Description: STG Manage Bladder With Equipment With  Min Assistance Outcome: Progressing   Problem: RH PAIN MANAGEMENT Goal: RH STG PAIN MANAGED AT OR BELOW PT'S PAIN GOAL Description: Pt will be free of pain Outcome: Progressing   Problem: RH KNOWLEDGE DEFICIT GENERAL Goal: RH STG INCREASE KNOWLEDGE OF SELF CARE AFTER HOSPITALIZATION Description: Pt will be able to verbalize signs of stroke and ways to reduce risk for stroke along with compliance of medications and diet prior to DC Outcome: Progressing   Problem: RH BOWEL ELIMINATION Goal: RH STG MANAGE BOWEL WITH ASSISTANCE Description: STG Manage Bowel with min Assistance. Outcome: Not Progressing  Pt given sorbitol this AM per order, ineffective at presence. Offered pt prune juice, stated he wanted to try and use the bathroom first but absolutely denied suppository. Erie Noe, RN

## 2018-08-21 NOTE — Progress Notes (Signed)
[place] PHYSICAL MEDICINE & REHABILITATION PROGRESS NOTE   Subjective/Complaints: Up in bed. No new issues. Doesn't want suppository  ROS: Patient denies fever, rash, sore throat, blurred vision, nausea, vomiting, diarrhea, cough, shortness of breath or chest pain, joint or back pain, headache, or mood change.    Objective:   No results found. No results for input(s): WBC, HGB, HCT, PLT in the last 72 hours. No results for input(s): NA, K, CL, CO2, GLUCOSE, BUN, CREATININE, CALCIUM in the last 72 hours.  Intake/Output Summary (Last 24 hours) at 08/21/2018 0934 Last data filed at 08/21/2018 0930 Gross per 24 hour  Intake 715 ml  Output 1100 ml  Net -385 ml     Physical Exam: Vital Signs Blood pressure 124/80, pulse 69, temperature 98.4 F (36.9 C), temperature source Oral, resp. rate 18, height 5\' 9"  (1.753 m), weight 99.5 kg, SpO2 100 %.  Constitutional: No distress . Vital signs reviewed. HEENT: EOMI, oral membranes moist Neck: supple Cardiovascular: RRR without murmur. No JVD    Respiratory: CTA Bilaterally without wheezes or rales. Normal effort    GI: BS +, non-tender, non-distended  Musculoskeletal:     Comments: No edema or tenderness in extremities  Neurological: He is alert. Right lid ptosis. Limited vision to left Motor: RUE/RLE: 5/5 proximal to distal LUE: Should abduction 1 to 1+/5, elbow flex/ext 2-/5, hand grip tr to 1/5.  LLE: HF, KE 3-/5   ADF/APF 0/5- significant spastic left hemiparesis Sensation decreased to LT but hypersensitive Skin: Skin is warm and dry.  Psychiatric: flat   Assessment/Plan: 1. Functional deficits secondary to Right thalamic infarct which require 3+ hours per day of interdisciplinary therapy in a comprehensive inpatient rehab setting.  Physiatrist is providing close team supervision and 24 hour management of active medical problems listed below.  Physiatrist and rehab team continue to assess barriers to discharge/monitor patient  progress toward functional and medical goals  Care Tool:  Bathing  Bathing activity did not occur: Refused Body parts bathed by patient: Chest, Abdomen, Front perineal area, Right upper leg, Left upper leg, Left lower leg, Face   Body parts bathed by helper: Right arm, Left arm, Buttocks, Right lower leg     Bathing assist Assist Level: Maximal Assistance - Patient 24 - 49%(Stedy used for sit<stand)     Upper Body Dressing/Undressing Upper body dressing   What is the patient wearing?: Pull over shirt    Upper body assist Assist Level: Moderate Assistance - Patient 50 - 74%    Lower Body Dressing/Undressing Lower body dressing    Lower body dressing activity did not occur: Refused What is the patient wearing?: Pants     Lower body assist Assist for lower body dressing: Maximal Assistance - Patient 25 - 49%     Toileting Toileting    Toileting assist Assist for toileting: Moderate Assistance - Patient 50 - 74%     Transfers Chair/bed transfer  Transfers assist  Chair/bed transfer activity did not occur: Safety/medical concerns  Chair/bed transfer assist level: Moderate Assistance - Patient 50 - 74%     Locomotion Ambulation   Ambulation assist      Assist level: Maximal Assistance - Patient 25 - 49% Assistive device: No Device Max distance: 5   Walk 10 feet activity   Assist  Walk 10 feet activity did not occur: Safety/medical concerns        Walk 50 feet activity   Assist Walk 50 feet with 2 turns activity did not occur:  Safety/medical concerns         Walk 150 feet activity   Assist Walk 150 feet activity did not occur: Safety/medical concerns         Walk 10 feet on uneven surface  activity   Assist Walk 10 feet on uneven surfaces activity did not occur: Safety/medical concerns         Wheelchair     Assist   Type of Wheelchair: Manual    Wheelchair assist level: Minimal Assistance - Patient > 75% Max wheelchair  distance: 150    Wheelchair 50 feet with 2 turns activity    Assist        Assist Level: Minimal Assistance - Patient > 75%   Wheelchair 150 feet activity     Assist     Assist Level: Minimal Assistance - Patient > 75%    Medical Problem List and Plan: 1. Left hemiparesis with right proptosis, left hand homonymous hemianopsia and left lateral lean secondary to  acute right thalamic, midbrain and internal capsule infarcts.             -Continue CIR therapies including PT, OT, and SLP . 2.  Antithrombotics: -DVT/anticoagulation:  Pharmaceutical: Lovenox             -antiplatelet therapy: ASA and Plavix x3 weeks followed by Plavix alone 3. Pain Management: N/A 4. Mood: LCSW to follow for evaluation and support             -antipsychotic agents: N/A 5. Neuropsych: This patient is ?fully capable of making decisions on his own behalf. 6. Skin/Wound Care: Routine pressure relief measures 7. Fluids/Electrolytes/Nutrition: Monitor I/Os.                encourage fluids, po 8.  Prediabetes: Hemoglobin A1c is-6.2. Carb modified restrictions added to diet.                -AM cbg wnl so far 9.  Hypokalemia: potassium 3.8, continue K+ supp, recheck monday 10.  Dyslipidemia: On Lipitor 11.  HTN: Permissive hypertension,   -began metoprolol 12.5 mg bid 8/7--- some improvement today---avoid over treatment 12. Substance abuse: Counsel 13. Constipation: no bm since rehab admit  -give sorbitol today  -make available fleet enema also (if he will take)  -dulcolax suppository prn    LOS: 4 days A FACE TO FACE EVALUATION WAS PERFORMED  Ranelle OysterZachary T Swartz 08/21/2018, 9:34 AM

## 2018-08-21 NOTE — Progress Notes (Addendum)
LBM=PTA. 2 scheduled senna s given and PRN miralax also given. Patient adamantly refusing Hood supp. Or enema. "I don't feel constipated."  Abd. Slightly distended with good bowel sounds. Calls for assistance to use urinal-"every time I try and use it myself, I spill it." No attempts OOB, without assistance.Todd Hood  Daily CBG Saturday AM=103.

## 2018-08-22 ENCOUNTER — Inpatient Hospital Stay (HOSPITAL_COMMUNITY): Payer: Self-pay | Admitting: Physical Therapy

## 2018-08-22 ENCOUNTER — Inpatient Hospital Stay (HOSPITAL_COMMUNITY): Payer: Self-pay | Admitting: Occupational Therapy

## 2018-08-22 DIAGNOSIS — G8114 Spastic hemiplegia affecting left nondominant side: Secondary | ICD-10-CM

## 2018-08-22 DIAGNOSIS — H53462 Homonymous bilateral field defects, left side: Secondary | ICD-10-CM

## 2018-08-22 LAB — BASIC METABOLIC PANEL
Anion gap: 10 (ref 5–15)
BUN: 19 mg/dL (ref 6–20)
CO2: 25 mmol/L (ref 22–32)
Calcium: 9.7 mg/dL (ref 8.9–10.3)
Chloride: 104 mmol/L (ref 98–111)
Creatinine, Ser: 1.05 mg/dL (ref 0.61–1.24)
GFR calc Af Amer: >60 mL/min (ref >60)
GFR calc non Af Amer: >60 mL/min (ref >60)
Glucose, Bld: 122 mg/dL — ABNORMAL HIGH (ref 70–99)
Potassium: 3.8 mmol/L (ref 3.5–5.1)
Sodium: 139 mmol/L (ref 135–145)

## 2018-08-22 LAB — CBC
HCT: 44.3 % (ref 39.0–52.0)
Hemoglobin: 15.9 g/dL (ref 13.0–17.0)
MCH: 31.2 pg (ref 26.0–34.0)
MCHC: 35.9 g/dL (ref 30.0–36.0)
MCV: 87 fL (ref 80.0–100.0)
Platelets: 243 10*3/uL (ref 150–400)
RBC: 5.09 MIL/uL (ref 4.22–5.81)
RDW: 13 % (ref 11.5–15.5)
WBC: 9.7 10*3/uL (ref 4.0–10.5)
nRBC: 0 % (ref 0.0–0.2)

## 2018-08-22 LAB — GLUCOSE, CAPILLARY
Glucose-Capillary: 103 mg/dL — ABNORMAL HIGH (ref 70–99)
Glucose-Capillary: 83 mg/dL (ref 70–99)
Glucose-Capillary: 116 mg/dL — ABNORMAL HIGH (ref 70–99)
Glucose-Capillary: 130 mg/dL — ABNORMAL HIGH (ref 70–99)

## 2018-08-22 NOTE — Progress Notes (Signed)
Physical Therapy Session Note  Patient Details  Name: Todd Hood MRN: [id number] Date of Birth: [birth year]  Today's Date: 08/22/2018 PT Individual Time: 1300-1400 PT Individual Time Calculation (min): 60 min   Short Term Goals: Week 1:  PT Short Term Goal 1 (Week 1): Pt will perform bed mobility with min Assist PT Short Term Goal 2 (Week 1): Pt will transfer to [facility] with mod assist consistently PT Short Term Goal 3 (Week 1): Pt will ambulate 40ft with mod assist and LRAD PT Short Term Goal 4 (Week 1): Pt will propell WC 16ft with supervision A PT Short Term Goal 5 (Week 1): Pt will initiate stair training.  Skilled Therapeutic Interventions/Progress Updates:    pt rec'd in bed, flat affect this session, more lethargic but agreeable to therapy.  Pt performs w/c mobility with mod cuing, supervision x 150'.  Stand pivot transfers throughout session with mod A. Gait training with +2 assist, 3 musketeers with focus on upright posture, wt shifts to Rt with +2 mod A.  Pt requires assistance for swing through with Lt LE.  Standing balance at mirror with focus on midline posture and facilitation of Lt glutes and quad. Utilized reaching task, placing Rt LE on 6'' step and sustained squats. Pt requires max manual facilitaiton for posture and to turn on glutes. Pt able to turn on Lt quad briefly at end of session. Pt left in w/c with alarm set and needs at hand.  Therapy Documentation Precautions:  Precautions Precautions: Fall Precaution Comments: R eye ptosis  Restrictions Weight Bearing Restrictions: No Pain:  no c/o pain   Therapy/Group: Individual Therapy  Mykaela Arena 08/22/2018, 2:01 PM

## 2018-08-22 NOTE — Progress Notes (Signed)
Occupational Therapy Session Note  Patient Details  Name: Todd Hood MRN: [id number] Date of Birth: 22-Aug-1975  Today's Date: 08/22/2018 OT Individual Time: 0705-0800 OT Individual Time Calculation (min): 55 min    Short Term Goals: Week 1:  OT Short Term Goal 1 (Week 1): PT will perform mod A standing balance during LB clothing management. OT Short Term Goal 2 (Week 1): Pt will perform UB dressing with mod A. OT Short Term Goal 3 (Week 1): Pt will perform toilet transfer with mod A.  Skilled Therapeutic Interventions/Progress Updates:    Upon entering the room, pt supine in bed and reports, " ohh bubble guts". Pt requesting to return to bathroom secondary to urgent need for BM. Stedy utilized secondary to urgency. Pt standing in stedy with mod A and assisted to bathroom. Pt able to manage clothing with min A for standing balance. Pt having multiple bouts of diarrhea and remaining on toilet during entire session. NT notified of pt remaining in bathroom. Call bell within reach.   Therapy Documentation Precautions:  Precautions Precautions: Fall Precaution Comments: R eye ptosis  Restrictions Weight Bearing Restrictions: No   Therapy/Group: Individual Therapy  Gypsy Decant 08/22/2018, 12:55 PM

## 2018-08-22 NOTE — Progress Notes (Signed)
[place] PHYSICAL MEDICINE & REHABILITATION PROGRESS NOTE   Subjective/Complaints: Eating breakfast, states he has had tingling in Left hand since first CVA in Feb  ROS: Patient denies  nausea, vomiting, diarrhea, cough, shortness of breath or chest pain,    Objective:   No results found. No results for input(s): WBC, HGB, HCT, PLT in the last 72 hours. No results for input(s): NA, K, CL, CO2, GLUCOSE, BUN, CREATININE, CALCIUM in the last 72 hours.  Intake/Output Summary (Last 24 hours) at 08/22/2018 0757 Last data filed at 08/21/2018 2314 Gross per 24 hour  Intake 240 ml  Output 475 ml  Net -235 ml     Physical Exam: Vital Signs Blood pressure 124/80, pulse 71, temperature 99.2 F (37.3 C), temperature source Oral, resp. rate 16, height 5\' 9"  (1.753 m), weight 99.5 kg, SpO2 100 %.  Constitutional: No distress . Vital signs reviewed. HEENT: EOMI, oral membranes moist Neck: supple Cardiovascular: RRR without murmur. No JVD    Respiratory: CTA Bilaterally without wheezes or rales. Normal effort    GI: BS +, non-tender, non-distended  Musculoskeletal:     Comments: No edema or tenderness in extremities  Neurological: He is alert. Right lid ptosis. Limited vision to left Motor: RUE/RLE: 5/5 proximal to distal LUE: Should abduction 1 to 1+/5, elbow flex/ext 2-/5, hand grip tr to 1/5.  LLE: HF, KE 3-/5   ADF/APF 0/5- significant spastic left hemiparesis Sensation decreased to LT but hypersensitive Skin: Skin is warm and dry.  Psychiatric: flat   Assessment/Plan: 1. Functional deficits secondary to Right thalamic infarct which require 3+ hours per day of interdisciplinary therapy in a comprehensive inpatient rehab setting.  Physiatrist is providing close team supervision and 24 hour management of active medical problems listed below.  Physiatrist and rehab team continue to assess barriers to discharge/monitor patient progress toward functional and medical goals  Care  Tool:  Bathing  Bathing activity did not occur: Refused Body parts bathed by patient: Chest, Abdomen, Front perineal area, Right upper leg, Left upper leg, Left lower leg, Face   Body parts bathed by helper: Right arm, Left arm, Buttocks, Right lower leg     Bathing assist Assist Level: Maximal Assistance - Patient 24 - 49%(Stedy used for sit<stand)     Upper Body Dressing/Undressing Upper body dressing   What is the patient wearing?: Pull over shirt    Upper body assist Assist Level: Moderate Assistance - Patient 50 - 74%    Lower Body Dressing/Undressing Lower body dressing    Lower body dressing activity did not occur: Refused What is the patient wearing?: Pants     Lower body assist Assist for lower body dressing: Maximal Assistance - Patient 25 - 49%     Toileting Toileting    Toileting assist Assist for toileting: Moderate Assistance - Patient 50 - 74%     Transfers Chair/bed transfer  Transfers assist  Chair/bed transfer activity did not occur: Safety/medical concerns  Chair/bed transfer assist level: Moderate Assistance - Patient 50 - 74%     Locomotion Ambulation   Ambulation assist      Assist level: Maximal Assistance - Patient 25 - 49% Assistive device: No Device Max distance: 5   Walk 10 feet activity   Assist  Walk 10 feet activity did not occur: Safety/medical concerns        Walk 50 feet activity   Assist Walk 50 feet with 2 turns activity did not occur: Safety/medical concerns  Walk 150 feet activity   Assist Walk 150 feet activity did not occur: Safety/medical concerns         Walk 10 feet on uneven surface  activity   Assist Walk 10 feet on uneven surfaces activity did not occur: Safety/medical concerns         Wheelchair     Assist   Type of Wheelchair: Manual    Wheelchair assist level: Minimal Assistance - Patient > 75% Max wheelchair distance: 150    Wheelchair 50 feet with 2 turns  activity    Assist        Assist Level: Minimal Assistance - Patient > 75%   Wheelchair 150 feet activity     Assist     Assist Level: Minimal Assistance - Patient > 75%    Medical Problem List and Plan: 1. Left hemiparesisand left lateral lean secondary to  acute right thalamic, midbrain and internal capsule infarcts. Left homonymous hemianopsia with neglect due to prior R PCA infarct              -Continue CIR therapies including PT, OT, and SLP . 2.  Antithrombotics: -DVT/anticoagulation:  Pharmaceutical: Lovenox             -antiplatelet therapy: ASA and Plavix x3 weeks followed by Plavix alone 3. Pain Management: N/A 4. Mood: LCSW to follow for evaluation and support             -antipsychotic agents: N/A 5. Neuropsych: This patient is ?fully capable of making decisions on his own behalf. 6. Skin/Wound Care: Routine pressure relief measures 7. Fluids/Electrolytes/Nutrition: Monitor I/Os.                encourage fluids, po 8.  Prediabetes: Hemoglobin A1c is-6.2. Carb modified restrictions added to diet.                -AM cbg wnl so far 9.  Hypokalemia: potassium 3.8, continue K+ supp, recheck monday 10.  Dyslipidemia: On Lipitor 11.  HTN: Permissive hypertension,   -began metoprolol 12.5 mg bid 8/7--- some improvement today---avoid over treatment Vitals:   08/21/18 2004 08/22/18 0356  BP: (!) 140/98 124/80  Pulse:  71  Resp:  16  Temp:  99.2 F (37.3 C)  SpO2:  100%  controlled 8/10  12. Substance abuse: Counsel 13. Constipation: no bm since rehab admit  -give sorbitol today  -make available fleet enema also (if he will take)  -dulcolax suppository prn    LOS: 5 days A FACE TO FACE EVALUATION WAS PERFORMED  Erick Colacendrew E Phillipe Clemon 08/22/2018, 7:57 AM

## 2018-08-22 NOTE — Progress Notes (Signed)
Occupational Therapy Session Note  Patient Details  Name: Todd Hood MRN: [id number] Date of Birth: Jul 14, 1975  Today's Date: 08/22/2018 OT Individual Time: 6789-3810 OT Individual Time Calculation (min): 72 min   Short Term Goals: Week 1:  OT Short Term Goal 1 (Week 1): PT will perform mod A standing balance during LB clothing management. OT Short Term Goal 2 (Week 1): Pt will perform UB dressing with mod A. OT Short Term Goal 3 (Week 1): Pt will perform toilet transfer with mod A.     Skilled Therapeutic Interventions/Progress Updates:    Pt greeted in bed with no c/o pain. Agreeable to shower. Squat pivot<w/c<TTB completed with Mod A. He completed bathing (sit<stand from TTB), dressing (w/c level at sink, sit<stand), and oral care/grooming tasks (standing at sink) during session. Tx focus placed on midline orientation, dynamic sitting/standing balance, cognition, and Lt NMR. HOH for integrating L UE functionally during all stated tasks, though he does have a bit of active movement. Mod-max vcs for initiation/sequencing and sustained attention. Min A for sit<stands and Mod A for balance due to active Lt knee buckling and Lt lean. In standing, pt completed perihygiene in shower with Lt hand holding onto grab bar, however he needed assist for thoroughness and we utilized seated lateral leans to do so. Vcs and assist for correct clothing orientation with pt unable to recognize his shorts were on backwards or that he had threaded Lt arm into the head hole. Mod A for donning undershirt and t-shirt using hemi techniques. Able to utilize figure 4 bilaterally with assist for maintaining position with Lt in shower only due to slickness from soap. Mod A for donning socks with HOH to include L UE. After oral care and handwashing, pt reported he had to urinate. Squat pivot<drop arm BSC over toilet completed with Mod A. Brief was soiled with urine and feces. Pt was set up in the [place] and left with NT to  continue voiding B+B.   Therapy Documentation Precautions:  Precautions Precautions: Fall Precaution Comments: R eye ptosis  Restrictions Weight Bearing Restrictions: No Vital Signs: Therapy Vitals Temp: 98.4 F (36.9 C) Temp Source: Oral Pulse Rate: 91 BP: (!) 157/120 Patient Position (if appropriate): Sitting Oxygen Therapy SpO2: 98 % O2 Device: Room Air ADL:       Therapy/Group: Individual Therapy  Conlan Miceli A Keller Mikels 08/22/2018, 3:38 PM

## 2018-08-22 NOTE — Progress Notes (Signed)
Pt LBM was PTA on unit. Pt was adm prune juice, miralax, sorbitol, and suppository during day shift. Pt was on toilet for +30 mins, but was unsuccessful. Pt stated that felt like having BM. Pt refused enema, stating doesn't want anything entering rectum for the time being. Pt was compliant with scheduled senokot. Will continue to monitor.

## 2018-08-22 NOTE — Plan of Care (Signed)
  Problem: RH BOWEL ELIMINATION Goal: RH STG MANAGE BOWEL WITH ASSISTANCE Description: STG Manage Bowel with min Assistance. Outcome: Progressing Goal: RH STG MANAGE BOWEL W/MEDICATION W/ASSISTANCE Description: STG Manage Bowel with Medication with  mod I Assistance. Outcome: Progressing   Problem: RH BLADDER ELIMINATION Goal: RH STG MANAGE BLADDER WITH ASSISTANCE Description: STG Manage Bladder With Min Assistance Outcome: Progressing Goal: RH STG MANAGE BLADDER WITH EQUIPMENT WITH ASSISTANCE Description: STG Manage Bladder With Equipment With  Min Assistance Outcome: Progressing   Problem: RH PAIN MANAGEMENT Goal: RH STG PAIN MANAGED AT OR BELOW PT'S PAIN GOAL Description: Pt will be free of pain Outcome: Progressing   Problem: RH KNOWLEDGE DEFICIT GENERAL Goal: RH STG INCREASE KNOWLEDGE OF SELF CARE AFTER HOSPITALIZATION Description: Pt will be able to verbalize signs of stroke and ways to reduce risk for stroke along with compliance of medications and diet with supervision assist prior to DC Outcome: Progressing   Problem: RH Vision Goal: RH LTG Vision (Specify) Outcome: Progressing

## 2018-08-23 ENCOUNTER — Inpatient Hospital Stay (HOSPITAL_COMMUNITY): Payer: Self-pay

## 2018-08-23 ENCOUNTER — Inpatient Hospital Stay (HOSPITAL_COMMUNITY): Payer: Self-pay | Admitting: Occupational Therapy

## 2018-08-23 ENCOUNTER — Inpatient Hospital Stay (HOSPITAL_COMMUNITY): Payer: Self-pay | Admitting: Physical Therapy

## 2018-08-23 LAB — GLUCOSE, CAPILLARY: Glucose-Capillary: 94 mg/dL (ref 70–99)

## 2018-08-23 MED ORDER — DOCUSATE SODIUM 100 MG PO CAPS
100.0000 mg | ORAL_CAPSULE | Freq: Two times a day (BID) | ORAL | Status: DC
  Administered 2018-08-23 – 2018-09-07 (×30): 100 mg via ORAL
  Filled 2018-08-23 (×31): qty 1

## 2018-08-23 NOTE — Progress Notes (Signed)
Occupational Therapy Session Note  Patient Details  Name: Todd Hood MRN: [id number] Date of Birth: [birth year]  Today's Date: 08/23/2018 OT Individual Time: 1700-1749  1530-1600 OT Individual Time Calculation (min): 74 min and 30 mins   Short Term Goals: Week 1:  OT Short Term Goal 1 (Week 1): PT will perform mod A standing balance during LB clothing management. OT Short Term Goal 2 (Week 1): Pt will perform UB dressing with mod A. OT Short Term Goal 3 (Week 1): Pt will perform toilet transfer with mod A.  Skilled Therapeutic Interventions/Progress Updates:    Session 1: Upon entering the room, pt seated in recliner chair with no c/o pain this session. Pt declined bathing and toileting this session but was agreeable to changing shirt as it was visibly soiled. Focus on hemiplegic technique with mod A for UB dressing. Sit >stand with mod A and stand pivot transfer with mod A into wheelchair. OT assisted pt to ADL apartment while he spoke to family member on cell phone. Pt standing with mod A and utilize RW with max A for side stepping to the R to [facility] in apartment. Pt needing max cuing for technique and weight shift, OT blocking L knee, and advancing L LE. Sit >supine with mod A. Pt verbalized bed and recliner chair in apartment being similar to home environment. Pt ambulating 5' forward and then side stepping to recliner chair from bed with max A as well. Pt transferred back to wheelchair with mod A stand pivot transfer. Pt propelled wheelchair 150' back to room with hemiplegic technique and min A in order to not hit items on L side. Pt remained in wheelchair with chair belt donned and activated. Call bell within reach.   Session 2: Upon entering the room, pt supine in bed and talking on phone with family member. Pt was agreeable to OT intervention but does report fatigue and requests to be seen from bed level. Focus on L UE NMR PNF movement patterns in gravity eliminated position with assistance  as needed. Pt with some bicep and tricep activation this session. PROM in all planes of movement for L UE. Pt remained supine in bed with bed alarm activated and call bell within reach.   Therapy Documentation Precautions:  Precautions Precautions: Fall Precaution Comments: R eye ptosis  Restrictions Weight Bearing Restrictions: No   Therapy/Group: Individual Therapy  Gypsy Decant 08/23/2018, 10:57 AM

## 2018-08-23 NOTE — Progress Notes (Signed)
Physical Therapy Session Note  Patient Details  Name: Todd Hood MRN: [id number] Date of Birth: January 02, 1976  Today's Date: 08/23/2018 PT Individual Time: 3546-5681 PT Individual Time Calculation (min): 40 min   Short Term Goals: Week 1:  PT Short Term Goal 1 (Week 1): Pt will perform bed mobility with min Assist PT Short Term Goal 2 (Week 1): Pt will transfer to [facility] with mod assist consistently PT Short Term Goal 3 (Week 1): Pt will ambulate 43ft with mod assist and LRAD PT Short Term Goal 4 (Week 1): Pt will propell WC 116ft with supervision A PT Short Term Goal 5 (Week 1): Pt will initiate stair training.  Skilled Therapeutic Interventions/Progress Updates:    pt performs w/c mobility with hemi technique with min/mod cuing for Lt attention.  Standing balance with reaching task with focus on Lt knee and hip extension with max A for balance, still no palpable quad contraction, trace hip extension.  Standing ball kick with +2 assist for safety and balance, max A for Lt knee extension.  Gait with RW x 15' with max A for wt shifts, posture and Lt LE advancement.  Pt left in bed with alarm set, needs at hand, family present  Therapy Documentation Precautions:  Precautions Precautions: Fall Precaution Comments: R eye ptosis  Restrictions Weight Bearing Restrictions: No Pain:  no c/o pain   Therapy/Group: Individual Therapy  [name] 08/23/2018, 2:39 PM

## 2018-08-23 NOTE — Progress Notes (Signed)
Physical Therapy Session Note  Patient Details  Name: Todd Hood MRN: [id number] Date of Birth: 03-20-1975  Today's Date: 08/23/2018 PT Individual Time: 1130-1200 PT Individual Time Calculation (min): 30 min   Short Term Goals: Week 1:  PT Short Term Goal 1 (Week 1): Pt will perform bed mobility with min Assist PT Short Term Goal 2 (Week 1): Pt will transfer to [facility] with mod assist consistently PT Short Term Goal 3 (Week 1): Pt will ambulate 72ft with mod assist and LRAD PT Short Term Goal 4 (Week 1): Pt will propell WC 156ft with supervision A PT Short Term Goal 5 (Week 1): Pt will initiate stair training.  Skilled Therapeutic Interventions/Progress Updates:    Session focused on NMR to address postural control (tendency for pushing to the L) retraining, LLE motor activation during gait training with RW with L hand orthosis and DF ACE wrap to LLE (pt able to initiate L hip flexion but did not elicit knee extension or ankle DF) and manual facilitation for weightshifting to the R x 35' with max +2 assist for safety and balance, and reciprocal movement pattern retraining and LLE activation on Kinetron in seated position x 5 min. Emotional support and encouragement provided at end of session as pt becoming emotional about not seeing family and his progress. Pt appreciative of support by therapists.   Therapy Documentation Precautions:  Precautions Precautions: Fall Precaution Comments: R eye ptosis  Restrictions Weight Bearing Restrictions: No  Pain:  Denies pain.    Therapy/Group: Individual Therapy; Co-treatment with recreational therapy  Canary Brim Ivory Broad, PT, DPT, CBIS  08/23/2018, 12:17 PM

## 2018-08-23 NOTE — Progress Notes (Signed)
Occupational Therapy Session Note  Patient Details  Name: Todd Hood MRN: [id number] Date of Birth: October 01, 1975  Today's Date: 08/23/2018 OT Individual Time: 0800-0830 OT Individual Time Calculation (min): 30 min    Short Term Goals: Week 1:  OT Short Term Goal 1 (Week 1): PT will perform mod A standing balance during LB clothing management. OT Short Term Goal 2 (Week 1): Pt will perform UB dressing with mod A. OT Short Term Goal 3 (Week 1): Pt will perform toilet transfer with mod A.  Skilled Therapeutic Interventions/Progress Updates:    1:1 self care retraining. Pt in bed when arrived. Mod A transfer bed to w/c and then min A transfer to the [facility] over the commode. Total A for clothing management. Pt had a soiled brief and required total A to clean up. Pt able to maintain standing balance with min to mod A with cues to weight shift to the right. WAsh hands with mod A for support for left hand.  Pt given eye patch for trial. Pt reports it does not make a difference on either eye for improved vision.   Brushed teeth with setup and mod A.   Therapy Documentation Precautions:  Precautions Precautions: Fall Precaution Comments: R eye ptosis  Restrictions Weight Bearing Restrictions: No   Pain:  no c/o pain   Therapy/Group: Individual Therapy  Willeen Cass [facility] 08/23/2018, 3:47 PM

## 2018-08-23 NOTE — Progress Notes (Addendum)
[place] PHYSICAL MEDICINE & REHABILITATION PROGRESS NOTE   Subjective/Complaints:  No issues overnite BM x 2 after sorbitol   ROS: Patient denies  nausea, vomiting, diarrhea, cough, shortness of breath or chest pain,    Objective:   No results found. Recent Labs    08/22/18 1132  WBC 9.7  HGB 15.9  HCT 44.3  PLT 243   Recent Labs    08/22/18 1132  NA 139  K 3.8  CL 104  CO2 25  GLUCOSE 122*  BUN 19  CREATININE 1.05  CALCIUM 9.7    Intake/Output Summary (Last 24 hours) at 08/23/2018 0759 Last data filed at 08/22/2018 1900 Gross per 24 hour  Intake 720 ml  Output 400 ml  Net 320 ml     Physical Exam: Vital Signs Blood pressure (!) 117/97, pulse 73, temperature 98.3 F (36.8 C), resp. rate 20, height 5\' 9"  (1.753 m), weight 90.3 kg, SpO2 99 %.  Constitutional: No distress . Vital signs reviewed. HEENT: EOMI, oral membranes moist Neck: supple Cardiovascular: RRR without murmur. No JVD    Respiratory: CTA Bilaterally without wheezes or rales. Normal effort    GI: BS +, non-tender, non-distended  Musculoskeletal:     Comments: No edema or tenderness in extremities  Neurological: He is alert. Poor Left lid closure with reduced upward gaze  Motor: RUE/RLE: 5/5 proximal to distal LUE: Should abduction 1 to 1+/5, elbow flex/ext 2-/5, hand grip tr to 1/5.  LLE: HF, KE 3-/5   ADF/APF 0/5- significant spastic left hemiparesis Sensation decreased to LT but hypersensitive Skin: Skin is warm and dry.  Psychiatric: flat   Assessment/Plan: 1. Functional deficits secondary to Right thalamic infarct which require 3+ hours per day of interdisciplinary therapy in a comprehensive inpatient rehab setting.  Physiatrist is providing close team supervision and 24 hour management of active medical problems listed below.  Physiatrist and rehab team continue to assess barriers to discharge/monitor patient progress toward functional and medical goals  Care Tool:  Bathing   Bathing activity did not occur: Refused Body parts bathed by patient: Chest, Abdomen, Front perineal area, Right upper leg, Left upper leg, Face, Right lower leg, Left arm   Body parts bathed by helper: Right arm, Buttocks, Left lower leg     Bathing assist Assist Level: Moderate Assistance - Patient 50 - 74%     Upper Body Dressing/Undressing Upper body dressing   What is the patient wearing?: Pull over shirt    Upper body assist Assist Level: Moderate Assistance - Patient 50 - 74%    Lower Body Dressing/Undressing Lower body dressing    Lower body dressing activity did not occur: Refused What is the patient wearing?: Pants, Incontinence brief     Lower body assist Assist for lower body dressing: Maximal Assistance - Patient 25 - 49%     Toileting Toileting    Toileting assist Assist for toileting: Moderate Assistance - Patient 50 - 74%     Transfers Chair/bed transfer  Transfers assist  Chair/bed transfer activity did not occur: Safety/medical concerns  Chair/bed transfer assist level: Moderate Assistance - Patient 50 - 74%     Locomotion Ambulation   Ambulation assist      Assist level: 2 helpers Assistive device: No Device Max distance: 15   Walk 10 feet activity   Assist  Walk 10 feet activity did not occur: Safety/medical concerns  Assist level: 2 helpers     Walk 50 feet activity   Assist Walk 50  feet with 2 turns activity did not occur: Safety/medical concerns         Walk 150 feet activity   Assist Walk 150 feet activity did not occur: Safety/medical concerns         Walk 10 feet on uneven surface  activity   Assist Walk 10 feet on uneven surfaces activity did not occur: Safety/medical concerns         Wheelchair     Assist   Type of Wheelchair: Manual    Wheelchair assist level: Minimal Assistance - Patient > 75% Max wheelchair distance: 150    Wheelchair 50 feet with 2 turns activity    Assist         Assist Level: Minimal Assistance - Patient > 75%   Wheelchair 150 feet activity     Assist     Assist Level: Minimal Assistance - Patient > 75%    Medical Problem List and Plan: 1. Left hemiparesisand left lateral lean secondary to  acute right thalamic, midbrain and internal capsule infarcts. Left homonymous hemianopsia with neglect due to prior R PCA infarct              -Continue CIR therapies including PT, OT, and SLP, team conf in am  . 2.  Antithrombotics: -DVT/anticoagulation:  Pharmaceutical: Lovenox             -antiplatelet therapy: ASA and Plavix x3 weeks followed by Plavix alone 3. Pain Management: N/A 4. Mood: LCSW to follow for evaluation and support             -antipsychotic agents: N/A 5. Neuropsych: This patient is ?fully capable of making decisions on his own behalf. 6. Skin/Wound Care: Routine pressure relief measures 7. Fluids/Electrolytes/Nutrition: Monitor I/Os.                encourage fluids, po 8.  Prediabetes: Hemoglobin A1c is-6.2. Carb modified restrictions added to diet.                -AM cbg wnl so far 9.  Hypokalemia: potassium 3.8, continue K+ supp, recheck monday 10.  Dyslipidemia: On Lipitor 11.  HTN: Permissive hypertension,   -began metoprolol 12.5 mg bid 8/7--- some improvement today---avoid over treatment Vitals:   08/22/18 2128 08/23/18 0431  BP: (!) 132/91 (!) 117/97  Pulse: 65 73  Resp:  20  Temp:  98.3 F (36.8 C)  SpO2:  99%  controlled 8/11 except mildly increased diastolic  12. Substance abuse: Counsel 13. Constipation:improved  After sorbitol   -add colace as prophyllactic   -dulcolax suppository prn    LOS: 6 days A FACE TO FACE EVALUATION WAS PERFORMED  Erick Colacendrew E Jerri Hargadon 08/23/2018, 7:59 AM

## 2018-08-24 ENCOUNTER — Inpatient Hospital Stay (HOSPITAL_COMMUNITY): Payer: Self-pay | Admitting: Physical Therapy

## 2018-08-24 ENCOUNTER — Inpatient Hospital Stay (HOSPITAL_COMMUNITY): Payer: Self-pay

## 2018-08-24 ENCOUNTER — Encounter (HOSPITAL_COMMUNITY): Payer: Self-pay | Admitting: Psychology

## 2018-08-24 ENCOUNTER — Inpatient Hospital Stay (HOSPITAL_COMMUNITY): Payer: Self-pay | Admitting: Occupational Therapy

## 2018-08-24 ENCOUNTER — Inpatient Hospital Stay (HOSPITAL_COMMUNITY): Payer: Self-pay | Admitting: *Deleted

## 2018-08-24 LAB — GLUCOSE, CAPILLARY: Glucose-Capillary: 100 mg/dL — ABNORMAL HIGH (ref 70–99)

## 2018-08-24 NOTE — Consult Note (Signed)
Neuropsychological Consultation   Patient:   Todd Hood   DOB:   08/19/1975  MR Number:  [id number]  [facility]  [facility] [address] RE[facility] A [address] [id number] [facility] [place] [id number] Dept: [phone] Loc: [phone]           Date of Service:   08/24/2018  Start Time:   8 AM End Time:   9 AM  Provider/Observer:  Todd PhenixJohn Glynis Hood, Psy.D.       Clinical Neuropsychologist       Billing Code/Service: [id number]  Chief Complaint:    Todd Hood is a 43 year old male with history of HTN, R-CVA 02/2018 with residual mild left- sided weakness and left peripheral vision loss who was admitted on 08/14/2018 after a fall hitting his head and eye on air conditioner due to left-sided weakness.  Patient reports that he had been having issues night before but did not address it immediately.  After falling, he could not get back up and called to a relative across the hall to come and help.  Patient with left sided weakness, left facial weakness and dysarthria.  Patient with question about compliance of blood thinners prior to stroke.  MRI revealed acute right thalamic, midbrain and internal capsule infarcts.  UDS positive for THC.  Dr. Pearlean Hood felt that stroke embolic in nature of unknown source.    Reason for Service:  HPI: Todd Hood is a 43 year old male with history of HTN, R-CVA 2/20 with residual mild left-sided weakness and left peripheral vision loss; who was admitted on 08/14/2018 after a fall, htting the back of his head with left-sided weakness, left facial weakness, and dysarthria. History taken from chart review and patient. Patient had stopped taking his Plavix due to issues with dental pain.  CTA head neck/perfusion was negative for LVO ischemia and showed chronic right PCA occlusion with right-PCA territory encephalomalacia. MRI brain done revealing acute right thalamic, midbrain and internal capsule infarcts.  UDS was positive  for THC. BLE Dopplers negative for DVT.   He had 30-day event monitor earlier this year that was negative for A. fib.  Dr. Pearlean Hood felt that stroke was embolic due to unknown source and loop recorder recommended however patient declined this.  He had TEE on 08/16/2018 showing EF of 50-60% with intra-atrial septal aneurysm.  No shunting or thrombus noted.   Patient continues to be limited by left hemiparesis with right proptosis, left hand anonymous hemianopsia and left lateral lean with standing.  Therapy ongoing and CIR recommended due to functional decline. Please see preadmission assessment from today as well.  Current Status:  Patient with significant motor defecits on left side and continued swelling of right eye and stressed about not being able to see Todd Hood as she was helping him after last stroke.  Patient only able to have one family member and while he is stressed about not being able to se Todd Hood his father is retired and patient knows it is best his father is that family member to come see him is hospital.  Behavioral Observation: Todd Hood  presents as a 43 y.o.-year-old Right African American Male who appeared his stated age. his dress was Appropriate and he was Well Groomed and his manners were Appropriate to the situation.  his participation was indicative of Appropriate and Inattentive behaviors.  There were any physical disabilities noted.  he displayed an appropriate level of cooperation and motivation.     Interactions:  Active Appropriate and Inattentive  Attention:   abnormal and mostly due to internal preoccupations.  Memory:   within normal limits; recent and remote memory intact  Visuo-spatial:  not examined  Speech (Volume):  low  Speech:   normal; slurred  Thought Process:  Coherent and Relevant  Though Content:  WNL; not suicidal and not homicidal  Orientation:   person, place, time/date and situation  Judgment:   Fair  Planning:   Poor  Affect:    Anxious and  Depressed  Mood:    Dysphoric  Insight:   Fair  Intelligence:   normal  Medical History:   Past Medical History:  Diagnosis Date  . CVA (cerebral vascular accident) (Todd Hood)   . Hypertension      Psychiatric History:  Patient denies past psychiatric history but admitts that he did not always take his medications like he should and had started eating a poor diet of take out food due to working a lot and covid restrictions.    Family Med/Psych History:  Family History  Problem Relation Age of Onset  . Diabetes Mother   . Diabetes Father   . Sickle cell trait Father   . Stroke Father    Impression/DX:  Todd Hood is a 43 year old male with history of HTN, R-CVA 02/2018 with residual mild left- sided weakness and left peripheral vision loss who was admitted on 08/14/2018 after a fall hitting his head and eye on air conditioner due to left-sided weakness.  Patient reports that he had been having issues night before but did not address it immediately.  After falling, he could not get back up and called to a relative across the hall to come and help.  Patient with left sided weakness, left facial weakness and dysarthria.  Patient with question about compliance of blood thinners prior to stroke.  MRI revealed acute right thalamic, midbrain and internal capsule infarcts.  UDS positive for THC.  Dr. Leonie Hood felt that stroke embolic in nature of unknown source.    Patient with significant motor defecits on left side and continued swelling of right eye and stressed about not being able to see Todd Hood as she was helping him after last stroke.  Patient only able to have one family member and while he is stressed about not being able to se Todd Hood his father is retired and patient knows it is best his father is that family member to come see him is hospital.  Disposition/Plan:  While patient denies that his worry etc about his own medical status and not being able to see friends and family is negatively impacting  his therapy participation we will need to monitor for developing anxiety and depressive symptoms.  Will follow up with patient next week.  Diagnosis:    Right Thalamic Stroke        Electronically Signed   _______________________ Ilean Skill, Psy.D.

## 2018-08-24 NOTE — Progress Notes (Signed)
Physical Therapy Session Note  Patient Details  Name: Todd Hood MRN: [id number] Date of Birth: October 13, 1975  Today's Date: 08/24/2018 PT Individual Time: 4098-1191 and1103-1200 PT Individual Time Calculation (min): 58 min and 54mn  Short Term Goals: Week 1:  PT Short Term Goal 1 (Week 1): Pt will perform bed mobility with min Assist PT Short Term Goal 2 (Week 1): [facility] PT Short Term Goal 3 (Week 1): Pt will ambulate 332fwith mod assist and LRAD PT Short Term Goal 4 (Week 1): Pt will propell WC 15062fith supervision A PT Short Term Goal 5 (Week 1): Pt will initiate stair training.  Skilled Therapeutic Interventions/Progress Updates: Tx1: Pt presented in bed agreeable to therapy. Pt denies pain throughout session. Pt performed supine to sit with minA and use of bed features and performed squat pivot to L into w/c with modA. Pt transported to rehab gym for energy conservation and participated in pregait activities in [place] able to perform STS with modA due to poor LLE awareness and tactile cues to increase wt bearing to L. Performed toe taps with RLE with PTA blocking L knee and facilitating wt shifting to L. Pt required max multimodal cues to maintain knee extension. Pt demonstrated poor carryover with activity. Performed gait training with [place] x 3f42fth RW, ace bandage to L foot and maxA with PTA initially advancing LLE and progressing to pt  advancing LLE and PTA assisting with foot placement. Pt returned to room at end of session and agreeable to remain in w/c until next PT session in 30 min. Pt left in w/c with belt alarm on, call bell within reach and needs met.   Tx2: Pt presented in w/c agreeable to therapy and denies pain throughout session. Pt transported to day room for energy conservation with session focusing on gait on treadmill with Lite Gait. Pt performed x 2 bouts on Lite Gait at  0.2mph62mth PTA advancing LLE and  providing manual facilitation for wt shifting and facilitation for maintaining erect posture when advancing RLE. PTA was required to block L knee and maintain L knee extension in stance phase. Pt was able to tolerate x 2 min for first bout and x 5min 26mond bout. As pt fatigued pt required increased cues for advancing RLE. Pt propelled w/c back to room with modA due to poor attention to L and cues to maintain straight trajectory. Pt was able to improve trajectory when PTA was on L side providing resistance when pt would veer to L. In room pt performed squat pivot to R into recliner with minA. Pt remained in recliner at end of session and left with bed alarm on, call bell within reach and needs met.      Therapy Documentation Precautions:  Precautions Precautions: Fall Precaution Comments: R eye ptosis  Restrictions Weight Bearing Restrictions: No General:   Vital Signs: Therapy Vitals Pulse Rate: 87 BP: 131/78   Therapy/Group: Individual Therapy  Imya Mance  Mackenize Delgadillo, PTA  08/24/2018, 1:10 PM

## 2018-08-24 NOTE — Progress Notes (Signed)
Occupational Therapy Weekly Progress Note  Patient Details  Name: Todd Hood MRN: [id number] Date of Birth: 1975-07-26  Beginning of progress report period: August 18, 2018 End of progress report period: August 24, 2018  Today's Date: 08/24/2018 OT Individual Time: 1400-1515 OT Individual Time Calculation (min): 75 min    Patient has met 3 of 3 short term goals. Pt is making steady progress towards occupational therapy goals this week. Pt perform transfers at mod A overall. Pt performing LB dressing with max A and UB self care with mod A. Pt requiring max - total A for functional ambulation. Pt needing mod A for bathing at shower level. Pt is unrealistic about home set up. His bedroom and shower is up flight of stairs and he reports, " I can get to it." OT has discussed ways to set up downstairs but pt is resistant. Pt has also been very emotional about family not being able to see him and needing emotional support. Neuropsych will be following up as well. No change this week with L UE or vision.  Patient continues to demonstrate the following deficits: muscle weakness, decreased cardiorespiratoy endurance, abnormal tone, decreased coordination and decreased motor planning, decreased safety awareness and decreased sitting balance, decreased standing balance, decreased postural control, hemiplegia and decreased balance strategies and therefore will continue to benefit from skilled OT intervention to enhance overall performance with BADL and iADL.  Patient progressing toward long term goals..  Continue plan of care.  OT Short Term Goals Week 1:  OT Short Term Goal 1 (Week 1): PT will perform mod A standing balance during LB clothing management. OT Short Term Goal 1 - Progress (Week 1): Met OT Short Term Goal 2 (Week 1): Pt will perform UB dressing with mod A. OT Short Term Goal 2 - Progress (Week 1): Met OT Short Term Goal 3 (Week 1): Pt will perform toilet transfer with mod A. OT Short Term  Goal 3 - Progress (Week 1): Met Week 2:  OT Short Term Goal 1 (Week 2): Pt will demonstrate L UE HEP with paper handout and min cuing for technique. OT Short Term Goal 2 (Week 2): Pt will perform LB dressing with mod A. OT Short Term Goal 3 (Week 2): Pt will perform toileting with mod A.  Skilled Therapeutic Interventions/Progress Updates:    Upon entering the room, pt supine in bed with no c/o pain this session. His father is present for a few minutes but leave at beginning of session. Pt agreeable to shower this session. Supine >sit with mod A to EOB. Pt transferred into wheelchair with mod A stand pivot transfer. OT assisted pt via wheelchair into bathroom. Transfer onto TTB with mod A as well. Pt needing mod cuing for each transfer for wheelchair set up, foot placement, and hand placement. Pt able to utilize figure four position to wash B feet with assistance. Pt standing with mod A and use of grab bar for therapist to wash buttocks. Pt exiting the shower and donning clothing items with focus on hemiplegic technique and sit <>stand with mirror for visual feedback. Pt returning to bed at end of session secondary to fatigue in same manner as above. Bed alarm activated and call bell within reach.   Therapy Documentation Precautions:  Precautions Precautions: Fall Precaution Comments: R eye ptosis  Restrictions Weight Bearing Restrictions: No Vital Signs: Therapy Vitals Temp: 98.6 F (37 C) Temp Source: Oral Pulse Rate: 77 Resp: 18 BP: (!) 128/103 Patient Position (if appropriate): Sitting  Oxygen Therapy SpO2: 100 % O2 Device: Room Air   Therapy/Group: Individual Therapy  Gypsy Decant 08/24/2018, 5:12 PM

## 2018-08-24 NOTE — Patient Care Conference (Signed)
Inpatient RehabilitationTeam Conference and Plan of Care Update Date: 08/24/2018   Time: 10:45 AM    Patient Name: Todd Hood      Medical Record Number: [id number]  Date of Birth: May 10, 1975 Sex: Male         Room/Bed: 4W05C/4W05C-01 Payor Info: Payor: /    Admitting Diagnosis: 5. CVA 1 Team  RT. CVA; 22-24days  Admit Date/Time:  08/17/2018  6:39 PM Admission Comments: No comment available   Primary Diagnosis:  Right thalamic stroke [facility]) Principal Problem: Right thalamic stroke [facility])  Patient Active Problem List   Diagnosis Date Noted  . Right thalamic stroke ([place]) 08/17/2018  . Dyslipidemia   . Hypokalemia   . Hemiparesis affecting left side as late effect of stroke ([place])   . HTN (hypertension) 08/15/2018  . Prediabetes 08/15/2018  . Marijuana use 08/15/2018  . Acute ischemic stroke ([place]) 08/14/2018    Expected Discharge Date: Expected Discharge Date: 09/07/18  Team Members Present: Physician leading conference: Dr. Alysia Penna Social Worker Present: Ovidio Kin, LCSW Nurse Present: Benjie Karvonen, RN PT Present: Roderic Ovens, PT OT Present: Darleen Crocker, OT SLP Present: Charolett Bumpers, SLP PPS Coordinator present : Gunnar Fusi, Novella Olive, PT     Current Status/Progress Goal Weekly Team Focus  Medical   Severe left neglect left hemiparesis, manage blood pressure  Maintain medical stability  Improved p.o. intake   Bowel/Bladder   con. of b/b  Remain con. of B/B with timed toileting.      Swallow/Nutrition/ Hydration             ADL's   Mod A bathing at shower level, Mod A UB dressing, Max A LB dressing, Mod A squat pivot toilet + shower transfers. Toileting tasks completed with St[place]. Still exhibits Lt lean and Lt knee buckling during functional tasks  Min A overall  Lt NMR, standing balance, ADL retraining, cognition, visual remediation/compensation   Mobility   +2 for safety with gait, mod A transfers  supervisoin transfers, min A gait   NMR, balance, gait   Communication             Safety/Cognition/ Behavioral Observations            Pain   No C/o pain  Remain free of pain.      Skin   No skin issues.  Remain free from skin breakdown.         *See Care Plan and progress notes for long and short-term goals.     Barriers to Discharge  Current Status/Progress Possible Resolutions Date Resolved   Physician    Medical stability;Medication compliance     Progressing towards goals  Continue rehabilitation program, continue medical management with daily MD visits      Nursing                  PT                    OT                  SLP                SW                Discharge Planning/Teaching Needs:  Home with girlfriend and cousin, unsure how much assist cousin can provide while girlfreind works. Pt's father has been coming in to visit and observe in therapies.      Team Discussion:  Goals min assist level. Currently mod/max level. Left side extremely weak and he leans to left and knee buckles. Emotional regarding CVA and visual issues. Neuro-psych seeing for coping. [facility]   Revisions to Treatment Plan:  DC 8/26    Continued Need for Acute Rehabilitation Level of Care: The patient requires daily medical management by a physician with specialized training in physical medicine and rehabilitation for the following conditions: Daily direction of a multidisciplinary physical rehabilitation program to ensure safe treatment while eliciting the highest outcome that is of practical value to the patient.: Yes Daily medical management of patient stability for increased activity during participation in an intensive rehabilitation regime.: Yes Daily analysis of laboratory values and/or radiology reports with any subsequent need for medication adjustment of medical intervention for : Neurological problems   I attest that I was present, lead the team  conference, and concur with the assessment and plan of the team. teleconferece held due to COVID19   Lucy ChrisDupree, Alexandria Shiflett G 08/24/2018, 3:15 PM

## 2018-08-24 NOTE — Progress Notes (Signed)
[facility] PHYSICAL MEDICINE & REHABILITATION PROGRESS NOTE   Subjective/Complaints:  No issues overnite , Left neglect noted, cannot find call bell on left side   ROS: Patient denies  nausea, vomiting, diarrhea, cough, shortness of breath or chest pain,    Objective:   No results found. Recent Labs    08/22/18 1132  WBC 9.7  HGB 15.9  HCT 44.3  PLT 243   Recent Labs    08/22/18 1132  NA 139  K 3.8  CL 104  CO2 25  GLUCOSE 122*  BUN 19  CREATININE 1.05  CALCIUM 9.7    Intake/Output Summary (Last 24 hours) at 08/24/2018 0756 Last data filed at 08/24/2018 0300 Gross per 24 hour  Intake 480 ml  Output 550 ml  Net -70 ml     Physical Exam: Vital Signs Blood pressure 124/87, pulse 76, temperature 98.3 F (36.8 C), resp. rate 20, height 5' 9" (1.753 m), weight 90.3 kg, SpO2 100 %.  Constitutional: No distress . Vital signs reviewed. HEENT: EOMI, oral membranes moist Neck: supple Cardiovascular: RRR without murmur. No JVD    Respiratory: CTA Bilaterally without wheezes or rales. Normal effort    GI: BS +, non-tender, non-distended  Musculoskeletal:     Comments: No edema or tenderness in extremities  Neurological: He is alert. Poor Left lid closure with reduced upward gaze  Motor: RUE/RLE: 5/5 proximal to distal LUE: Should abduction 1 to 1+/5, elbow flex/ext 2-/5, hand grip tr to 1/5.  LLE: HF, KE 3-/5   ADF/APF 0/5- significant spastic left hemiparesis Sensation decreased to LT but hypersensitive Skin: Skin is warm and dry.  Psychiatric: flat   Assessment/Plan: 1. Functional deficits secondary to Right thalamic infarct which require 3+ hours per day of interdisciplinary therapy in a comprehensive inpatient rehab setting.  Physiatrist is providing close team supervision and 24 hour management of active medical problems listed below.  Physiatrist and rehab team continue to assess barriers to discharge/monitor patient progress toward functional and medical  goals  Care Tool:  Bathing  Bathing activity did not occur: Refused Body parts bathed by patient: Chest, Abdomen, Front perineal area, Right upper leg, Left upper leg, Face, Right lower leg, Left arm   Body parts bathed by helper: Right arm, Buttocks, Left lower leg     Bathing assist Assist Level: Moderate Assistance - Patient 50 - 74%     Upper Body Dressing/Undressing Upper body dressing   What is the patient wearing?: Pull over shirt    Upper body assist Assist Level: Moderate Assistance - Patient 50 - 74%    Lower Body Dressing/Undressing Lower body dressing    Lower body dressing activity did not occur: Refused What is the patient wearing?: Pants, Incontinence brief     Lower body assist Assist for lower body dressing: Maximal Assistance - Patient 25 - 49%     Toileting Toileting    Toileting assist Assist for toileting: Maximal Assistance - Patient 25 - 49%     Transfers Chair/bed transfer  Transfers assist  Chair/bed transfer activity did not occur: Safety/medical concerns  Chair/bed transfer assist level: Moderate Assistance - Patient 50 - 74%     Locomotion Ambulation   Ambulation assist      Assist level: Maximal Assistance - Patient 25 - 49% Assistive device: Walker-rolling Max distance: 15   Walk 10 feet activity   Assist  Walk 10 feet activity did not occur: Safety/medical concerns  Assist level: Maximal Assistance - Patient 25 -  49% Assistive device: Walker-rolling   Walk 50 feet activity   Assist Walk 50 feet with 2 turns activity did not occur: Safety/medical concerns         Walk 150 feet activity   Assist Walk 150 feet activity did not occur: Safety/medical concerns         Walk 10 feet on uneven surface  activity   Assist Walk 10 feet on uneven surfaces activity did not occur: Safety/medical concerns         Wheelchair     Assist   Type of Wheelchair: Manual    Wheelchair assist level: Minimal  Assistance - Patient > 75% Max wheelchair distance: 150    Wheelchair 50 feet with 2 turns activity    Assist        Assist Level: Minimal Assistance - Patient > 75%   Wheelchair 150 feet activity     Assist     Assist Level: Minimal Assistance - Patient > 75%    Medical Problem List and Plan: 1. Left hemiparesisand left lateral lean secondary to  acute right thalamic, midbrain and internal capsule infarcts. Left homonymous hemianopsia with neglect due to prior R PCA infarct              -Continue CIR therapies including PT, OT, and SLP, Team conference today please see physician documentation under team conference tab, met with team face-to-face to discuss problems,progress, and goals. Formulized individual treatment plan based on medical history, underlying problem and comorbidities.  . 2.  Antithrombotics: -DVT/anticoagulation:  Pharmaceutical: Lovenox             -antiplatelet therapy: ASA and Plavix x3 weeks followed by Plavix alone ~8/23 3. Pain Management: N/A 4. Mood: LCSW to follow for evaluation and support             -antipsychotic agents: N/A 5. Neuropsych: This patient is ?fully capable of making decisions on his own behalf. 6. Skin/Wound Care: Routine pressure relief measures 7. Fluids/Electrolytes/Nutrition: Monitor I/Os.                encourage fluids, po 8.  Prediabetes: Hemoglobin A1c is-6.2. Carb modified restrictions added to diet.                -AM cbg wnl so far 9.  Hypokalemia: potassium 3.8, continue K+ supp, recheck monday 10.  Dyslipidemia: On Lipitor 11.  HTN: Permissive hypertension,   -began metoprolol 12.5 mg bid 8/7--- some improvement today---avoid over treatment Vitals:   08/23/18 2030 08/24/18 0428  BP: 131/82 124/87  Pulse: 94 76  Resp: 20 20  Temp: 98.3 F (36.8 C) 98.3 F (36.8 C)  SpO2: 100% 100%  controlled 8/12 except mildly increased diastolic  12. Substance abuse: Counsel 13. Constipation:improved  After sorbitol    -add colace as prophyllactic   -dulcolax suppository prn    LOS: 7 days A FACE TO FACE EVALUATION WAS PERFORMED  Charlett Blake 08/24/2018, 7:56 AM

## 2018-08-25 ENCOUNTER — Inpatient Hospital Stay (HOSPITAL_COMMUNITY): Payer: Self-pay | Admitting: Rehabilitation

## 2018-08-25 ENCOUNTER — Inpatient Hospital Stay (HOSPITAL_COMMUNITY): Payer: Self-pay | Admitting: Occupational Therapy

## 2018-08-25 ENCOUNTER — Inpatient Hospital Stay (HOSPITAL_COMMUNITY): Payer: Self-pay

## 2018-08-25 NOTE — Progress Notes (Signed)
Physical Therapy Weekly Progress Note  Patient Details  Name: Todd Hood MRN: [id number] Date of Birth: 1976-01-07  Beginning of progress report period: August 18, 2018 End of progress report period: August 25, 2018  Today's Date: 08/25/2018 PT Individual Time: 0905-1003 PT Individual Time Calculation (min): 58 min  Bed mobility retraining without rails and flat bed to simulate home environment with min assist for facilitation at trunk. Cues for management of LLE using RLE to assist. Pt able to activate LLE for LAQ against gravity today. Total assist to don shoes for OOB. Mod assist squat pivot transfer to w/c with several attempts and multimodal cues for technique and physical assist for L foot positioning and placement. Trial with AFO for LLE for knee control and DF assist during gait with RW and L hand orthosis requiring max assist with +2 for safety.Pt with improved postural control noted today (decreased pushing tendencies) and active knee extension noted in stance during gait!. Pt able to go x 16' x 2 with a turn. Still requires PT assist for controlled placement and positioning of LLE but able to actively initiate advancement during swing phase. Mod assist for sit <> stands with RW throughout session with multimodal cues for technique, weightshift, and hand placement. Initiated stair negotiation with R rail with max assist up/down 1 step x 4. Requires PT total assist for LLE placement and pt with poor awareness of LLE in space (on step vs on ground). At this time would not recommend accessing second floor. Pt mentioned his parents wanted him to move in with them where there are no stairs to enter and could stay on first floor, but he does not like the idea of "being back under their roof with their rules."  W/c propulsion with hemi technique with min assist overall due to poor awareness and visual deficits with pt running into objects on L side and mod verbal cues needed with some hands on assist  to prevent crash x 120'. End of session set up in w/c with all needs in reach and safety belt donned. Emotional support provided throughout session as pt emotional at times.   Patient has met 3 of 5 short term goals.  Pt making steady progress towards goals but anticipate pt will requires more physical assist for upright mobility due to visual/perceptual deficits as well as postural control impairments and pushing tendencies. Downgraded goals to more min assist level overall from ambulatory standpoint and supervision/min w/c level for safety. Pt is able to progress gait with max assist (+2 for safety) and functional transfers with mod assist. Pt likely will benefit from AFO consult (trial this session with AFO) prior to d/c.   Patient continues to demonstrate the following deficits muscle weakness and muscle paralysis, decreased cardiorespiratoy endurance, impaired timing and sequencing, decreased visual perceptual skills and field cut, decreased midline orientation and left side neglect, decreased attention, decreased awareness and decreased safety awareness and decreased sitting balance, decreased standing balance, decreased postural control, hemiplegia and decreased balance strategies and therefore will continue to benefit from skilled PT intervention to increase functional independence with mobility.  Patient progressing toward long term goals.  Plan of care revisions: downgrading stair goal due to safety and slow progress..  PT Short Term Goals Week 1:  PT Short Term Goal 1 (Week 1): Pt will perform bed mobility with min Assist PT Short Term Goal 1 - Progress (Week 1): Met PT Short Term Goal 2 (Week 1): Pt will transfer to [facility] with mod assist  consistently PT Short Term Goal 2 - Progress (Week 1): Met PT Short Term Goal 3 (Week 1): Pt will ambulate 3f with mod assist and LRAD PT Short Term Goal 3 - Progress (Week 1): Progressing toward goal PT Short Term Goal 4 (Week 1): Pt will propell WC [id number] with supervision A PT Short Term Goal 4 - Progress (Week 1): Progressing toward goal PT Short Term Goal 5 (Week 1): Pt will initiate stair training. PT Short Term Goal 5 - Progress (Week 1): Met Week 2:  PT Short Term Goal 1 (Week 2): Pt will be able to gait x 30' with mod assist PT Short Term Goal 2 (Week 2): Pt will be able to perform 4 steps with rail with mod assist to prepare for home entry PT Short Term Goal 3 (Week 2): Pt will be able to perform sit <> stand with min assist  Skilled Therapeutic Interventions/Progress Updates:  Ambulation/gait training;Balance/vestibular training;Cognitive remediation/compensation;Functional electrical stimulation;Community reintegration;DME/adaptive equipment instruction;Disease management/prevention;Discharge planning;Functional mobility training;Neuromuscular re-education;Pain management;Patient/family education;Splinting/orthotics;Skin care/wound management;Psychosocial support;Therapeutic Exercise;Therapeutic Activities;UE/LE Coordination activities;UE/LE Strength taining/ROM;Stair training;Wheelchair propulsion/positioning;Visual/perceptual remediation/compensation   Therapy Documentation Precautions:  Precautions Precautions: Fall Precaution Comments: R eye ptosis  Restrictions Weight Bearing Restrictions: No Pain:  no complaints of pain  Therapy/Group: Individual Therapy  GrCanary BrimrIvory BroadPT, DPT, CBIS  08/25/2018, 9:52 AM

## 2018-08-25 NOTE — Plan of Care (Signed)
  Problem: Sit to Stand Goal: LTG:  Patient will perform sit to stand with assistance level (PT) Description: LTG:  Patient will perform sit to stand with assistance level (PT) Flowsheets (Taken 08/25/2018 1222) LTG: PT will perform sit to stand in preparation for functional mobility with assistance level: (downgraded for safety. ABG) Contact Guard/Touching assist Note: Downgraded for safety. ABG   Problem: RH Ambulation Goal: LTG Patient will ambulate in controlled environment (PT) Description: LTG: Patient will ambulate in a controlled environment, # of feet with assistance (PT). Flowsheets (Taken 08/25/2018 1222) LTG: Pt will ambulate in controlled environ  assist needed:: (Downgraded for safety. ABG) Minimal Assistance - Patient > 75% LTG: Ambulation distance in controlled environment: 150' Note: Downgraded for safety. ABG Goal: LTG Patient will ambulate in home environment (PT) Description: LTG: Patient will ambulate in home environment, # of feet with assistance (PT). Flowsheets (Taken 08/25/2018 1222) LTG: Pt will ambulate in home environ  assist needed:: (Downgraded for safety. ABG) Minimal Assistance - Patient > 75% LTG: Ambulation distance in home environment: 24'   Problem: RH Wheelchair Mobility Goal: LTG Patient will propel w/c in controlled environment (PT) Description: LTG: Patient will propel wheelchair in controlled environment, # of feet with assist (PT) Flowsheets (Taken 08/25/2018 1222) LTG: Pt will propel w/c in controlled environ  assist needed:: (Downgraded due to visual perceptual deficits. ABG) Supervision/Verbal cueing LTG: Propel w/c distance in controlled environment: 150' Note: Downgraded due to visual perceptual deficits. ABG Goal: LTG Patient will propel w/c in home environment (PT) Description: LTG: Patient will propel wheelchair in home environment, # of feet with assistance (PT). Flowsheets (Taken 08/25/2018 1222) LTG: Pt will propel w/c in home environ   assist needed:: (Downgraded due to visual perceptual deficits. ABG) Supervision/Verbal cueing LTG: Propel w/c distance in home environment: 69' Note: Downgraded due to visual perceptual deficits. ABG   Problem: RH Stairs Goal: LTG Patient will ambulate up and down stairs w/assist (PT) Description: LTG: Patient will ambulate up and down # of stairs with assistance (PT) Flowsheets (Taken 08/25/2018 1222) LTG: Pt will ambulate up/down stairs assist needed:: (Downgraded  ABG) -- LTG: Pt will  ambulate up and down number of stairs: 4 steps with rail for home access Note: Downgraded due to slow progress with stairs and high fall risk. Not recommending accessing second floor at this time. ABG

## 2018-08-25 NOTE — Progress Notes (Signed)
Social Work Patient ID: Todd Hood, male   DOB: 03-Apr-1975, 43 y.o.   MRN: [id number]  Met with pt to update team conference goals of min assist and target discharge date of 8/26. He reports he is not sure who will be there with him when girlfriend is working. His cousin is there but not sure if he can assist him due to his own health issues. Will discuss with father and pt's girlfriend so we can come up with the best plan for him at discharge.

## 2018-08-25 NOTE — Plan of Care (Signed)
  Problem: RH BOWEL ELIMINATION Goal: RH STG MANAGE BOWEL WITH ASSISTANCE Description: STG Manage Bowel with min Assistance. Outcome: Progressing Goal: RH STG MANAGE BOWEL W/MEDICATION W/ASSISTANCE Description: STG Manage Bowel with Medication with  mod I Assistance. Outcome: Progressing   Problem: RH BLADDER ELIMINATION Goal: RH STG MANAGE BLADDER WITH ASSISTANCE Description: STG Manage Bladder With Min Assistance Outcome: Progressing Goal: RH STG MANAGE BLADDER WITH EQUIPMENT WITH ASSISTANCE Description: STG Manage Bladder With Equipment With  Min Assistance Outcome: Progressing   Problem: RH PAIN MANAGEMENT Goal: RH STG PAIN MANAGED AT OR BELOW PT'S PAIN GOAL Description: Pt will be free of pain Outcome: Progressing   Problem: RH KNOWLEDGE DEFICIT GENERAL Goal: RH STG INCREASE KNOWLEDGE OF SELF CARE AFTER HOSPITALIZATION Description: Pt will be able to verbalize signs of stroke and ways to reduce risk for stroke along with compliance of medications and diet with supervision assist prior to DC Outcome: Progressing   Problem: RH Vision Goal: RH LTG Vision (Specify) Outcome: Progressing   

## 2018-08-25 NOTE — Progress Notes (Signed)
[place] PHYSICAL MEDICINE & REHABILITATION PROGRESS NOTE   Subjective/Complaints:  No issues overnite , pt feels he is progressing, discussed time course of recovery   ROS: Patient denies  nausea, vomiting, diarrhea, cough, shortness of breath or chest pain,    Objective:   No results found. Recent Labs    08/22/18 1132  WBC 9.7  HGB 15.9  HCT 44.3  PLT 243   Recent Labs    08/22/18 1132  NA 139  K 3.8  CL 104  CO2 25  GLUCOSE 122*  BUN 19  CREATININE 1.05  CALCIUM 9.7    Intake/Output Summary (Last 24 hours) at 08/25/2018 0811 Last data filed at 08/25/2018 0530 Gross per 24 hour  Intake 508 ml  Output 775 ml  Net -267 ml     Physical Exam: Vital Signs Blood pressure 106/67, pulse 70, temperature 98.6 F (37 C), resp. rate 18, height 5\' 9"  (1.753 m), weight 92.4 kg, SpO2 97 %.  Constitutional: No distress . Vital signs reviewed. HEENT: EOMI, oral membranes moist Neck: supple Cardiovascular: RRR without murmur. No JVD    Respiratory: CTA Bilaterally without wheezes or rales. Normal effort    GI: BS +, non-tender, non-distended  Musculoskeletal:     Comments: No edema or tenderness in extremities  Neurological: He is alert. Poor Left lid closure with reduced upward gaze  Motor: RUE/RLE: 5/5 proximal to distal LUE: Should abduction 1 to 1+/5, elbow flex/ext 2-/5, hand grip tr to 1/5.  LLE: HF, KE 3-/5   ADF/APF 0/5- significant spastic left hemiparesis Sensation decreased to LT but hypersensitive Skin: Skin is warm and dry.  Psychiatric: flat   Assessment/Plan: 1. Functional deficits secondary to Right thalamic infarct which require 3+ hours per day of interdisciplinary therapy in a comprehensive inpatient rehab setting.  Physiatrist is providing close team supervision and 24 hour management of active medical problems listed below.  Physiatrist and rehab team continue to assess barriers to discharge/monitor patient progress toward functional and  medical goals  Care Tool:  Bathing  Bathing activity did not occur: Refused Body parts bathed by patient: Chest, Abdomen, Front perineal area, Right upper leg, Left upper leg, Face, Right lower leg, Left arm   Body parts bathed by helper: Right arm, Buttocks, Left lower leg     Bathing assist Assist Level: Moderate Assistance - Patient 50 - 74%     Upper Body Dressing/Undressing Upper body dressing   What is the patient wearing?: Pull over shirt    Upper body assist Assist Level: Moderate Assistance - Patient 50 - 74%    Lower Body Dressing/Undressing Lower body dressing    Lower body dressing activity did not occur: Refused What is the patient wearing?: Pants, Incontinence brief     Lower body assist Assist for lower body dressing: Maximal Assistance - Patient 25 - 49%     Toileting Toileting    Toileting assist Assist for toileting: Maximal Assistance - Patient 25 - 49%     Transfers Chair/bed transfer  Transfers assist  Chair/bed transfer activity did not occur: Safety/medical concerns  Chair/bed transfer assist level: Moderate Assistance - Patient 50 - 74%     Locomotion Ambulation   Ambulation assist      Assist level: Maximal Assistance - Patient 25 - 49% Assistive device: Walker-rolling Max distance: 15   Walk 10 feet activity   Assist  Walk 10 feet activity did not occur: Safety/medical concerns  Assist level: Maximal Assistance - Patient 25 -  49% Assistive device: Walker-rolling   Walk 50 feet activity   Assist Walk 50 feet with 2 turns activity did not occur: Safety/medical concerns         Walk 150 feet activity   Assist Walk 150 feet activity did not occur: Safety/medical concerns         Walk 10 feet on uneven surface  activity   Assist Walk 10 feet on uneven surfaces activity did not occur: Safety/medical concerns         Wheelchair     Assist   Type of Wheelchair: Manual    Wheelchair assist level:  Minimal Assistance - Patient > 75% Max wheelchair distance: 150    Wheelchair 50 feet with 2 turns activity    Assist        Assist Level: Minimal Assistance - Patient > 75%   Wheelchair 150 feet activity     Assist     Assist Level: Minimal Assistance - Patient > 75%    Medical Problem List and Plan: 1. Left hemiparesisand left lateral lean secondary to  acute right thalamic, midbrain and internal capsule infarcts. Left homonymous hemianopsia with neglect due to prior R PCA infarct              -Continue CIR therapies including PT, OT, and SLP, .  . 2.  Antithrombotics: -DVT/anticoagulation:  Pharmaceutical: Lovenox             -antiplatelet therapy: ASA and Plavix x3 weeks followed by Plavix alone ~8/23 3. Pain Management: N/A 4. Mood: LCSW to follow for evaluation and support             -antipsychotic agents: N/A 5. Neuropsych: This patient is ?fully capable of making decisions on his own behalf. 6. Skin/Wound Care: Routine pressure relief measures 7. Fluids/Electrolytes/Nutrition: Monitor I/Os.                encourage fluids, po 8.  Prediabetes: Hemoglobin A1c is-6.2. Carb modified restrictions added to diet.                -AM cbg wnl so far 9.  Hypokalemia: potassium 3.8, continue K+ supp, recheck monday 10.  Dyslipidemia: On Lipitor 11.  HTN: Permissive hypertension,   -began metoprolol 12.5 mg bid 8/7--- some improvement today---avoid over treatment Vitals:   08/25/18 0531 08/25/18 0807  BP: 123/89 106/67  Pulse: 76 70  Resp: 18   Temp: 98.6 F (37 C)   SpO2: 100% 97%  controlled 8/13 except mildly increased diastolic  12. Substance abuse: Counsel 13. Constipation:improved  After sorbitol   -add colace as prophyllactic   -dulcolax suppository prn    LOS: 8 days A FACE TO FACE EVALUATION WAS PERFORMED  Erick Colacendrew E Vennela Jutte 08/25/2018, 8:11 AM

## 2018-08-25 NOTE — Evaluation (Signed)
Recreational Therapy Assessment and Plan  Patient Details  Name: Todd Hood MRN: [id number] Date of Birth: 09-12-1975 Today's Date: 08/25/2018  Rehab Potential: Good ELOS: discharge 8/26   Assessment  Problem List:      Patient Active Problem List   Diagnosis Date Noted  . Right thalamic stroke ([place]) 08/17/2018  . Dyslipidemia   . Hypokalemia   . Hemiparesis affecting left side as late effect of stroke ([place])   . HTN (hypertension) 08/15/2018  . Prediabetes 08/15/2018  . Marijuana use 08/15/2018  . Acute ischemic stroke ([place]) 08/14/2018    Past Medical History:      Past Medical History:  Diagnosis Date  . CVA (cerebral vascular accident) ([place])   . Hypertension    Past Surgical History:  Past Surgical History:  Procedure Laterality Date  . BUBBLE STUDY  08/16/2018   Procedure: BUBBLE STUDY;  Surgeon: Lelon Perla, MD;  Location: [facility] ENDOSCOPY;  Service: Cardiovascular;;  . TEE WITHOUT CARDIOVERSION N/A 08/16/2018   Procedure: TRANSESOPHAGEAL ECHOCARDIOGRAM (TEE);  Surgeon: Lelon Perla, MD;  Location: [facility] ENDOSCOPY;  Service: Cardiovascular;  Laterality: N/A;    Assessment & Plan Clinical Impression: Patient is a 43 y.o. year old male with history of HTN, R-CVA 2/20 with residual mild left-sided weakness and left peripheral vision loss; who was admitted on 08/14/2018 after a fall, htting the back of his head with left-sided weakness, left facial weakness, and dysarthria. History taken from chart review and patient. Patient had stopped taking his Plavix due to issues with dental pain. CTA head neck/perfusion was negative for LVO ischemia and showed chronic right PCA occlusion with right-PCA territory encephalomalacia. MRI brain done revealing acute right thalamic, midbrain and internal capsule infarcts. UDS was positive for THC. BLE Dopplers negative for DVT.   He had 30-day event monitor earlier this year that was negative for A. fib. Dr. Leonie Man felt that  stroke was embolic due to unknown source and loop recorder recommended however patient declined this. He had TEE on 08/16/2018 showing EF of 50-60% with intra-atrial septal aneurysm. No shunting or thrombus noted. Patient continues to be limited by left hemiparesis with right proptosis, left hand anonymous hemianopsia and left lateral lean with standing. Therapy ongoing and CIR recommended due to functional decline.  .  Patient transferred to CIR on 08/17/2018 .    Pt presents with decreased activity tolerance, decreased functional mobility, decreased balance, decreased coordination, decreased vision, feelings of stress and anxiety Limiting pt's independence with leisure/community pursuits.   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;Sports - Exercise (Comment) Expression Interests: Music (Comment) Other Leisure Interests: Television Leisure Participation Style: With Family/Friends Awareness of Community Resources: Good-identify 3 post discharge leisure resources Psychosocial / Spiritual Stress Management: Fair Methods of Stress Management: pt reports daily marajuana use Does patient have pets?: Yes(pitt bull) Social interaction - Mood/Behavior: Cooperative Engineer, drilling for Education?: Yes Strengths/Weaknesses Patient Strengths/Abilities: Willingness to participate;Active premorbidly Patient weaknesses: Physical limitations TR Patient demonstrates impairments in the following area(s): Endurance;Motor;Safety  Plan Rec Therapy Plan Is patient appropriate for Therapeutic Recreation?: Yes Rehab Potential: Good Treatment times per week: Min 1 TR session>20 minutes per week Estimated Length of Stay: discharge 8/26 TR Treatment/Interventions: Adaptive equipment instruction;Community reintegration;Patient/family education;Therapeutic exercise;1:1 session;UE/LE Coordination  activities;Balance/vestibular training;Recreation/leisure participation;Visual/perceptual remediation/compensation;Therapeutic activities Recommendations for other services: Neuropsych  Recommendations for other services: Neuropsych  Discharge Criteria: Patient will be discharged from TR if patient refuses treatment 3 consecutive times without medical reason.  If treatment goals not met, if there is a change in medical status, if patient makes no progress towards goals or if patient is discharged from hospital.  The above assessment, treatment plan, treatment alternatives and goals were discussed and mutually agreed upon: by patient  [place] 08/25/2018, 9:02 AM

## 2018-08-25 NOTE — Progress Notes (Signed)
Physical Therapy Session Note  Patient Details  Name: Todd Hood MRN: [id number] Date of Birth: [birth year]  Today's Date: 08/25/2018 PT Individual Time: 1103-1200 PT Individual Time Calculation (min): 57 min   Short Term Goals: Week 1:  PT Short Term Goal 1 (Week 1): Pt will perform bed mobility with min Assist PT Short Term Goal 1 - Progress (Week 1): Met PT Short Term Goal 2 (Week 1): Pt will transfer to [facility] with mod assist consistently PT Short Term Goal 2 - Progress (Week 1): Met PT Short Term Goal 3 (Week 1): Pt will ambulate 65f with mod assist and LRAD PT Short Term Goal 3 - Progress (Week 1): Progressing toward goal PT Short Term Goal 4 (Week 1): Pt will propell WC 1568fwith supervision A PT Short Term Goal 4 - Progress (Week 1): Progressing toward goal PT Short Term Goal 5 (Week 1): Pt will initiate stair training. PT Short Term Goal 5 - Progress (Week 1): Met Week 2:  PT Short Term Goal 1 (Week 2): Pt will be able to gait x 30' with mod assist PT Short Term Goal 2 (Week 2): Pt will be able to perform 4 steps with rail with mod assist to prepare for home entry PT Short Term Goal 3 (Week 2): Pt will be able to perform sit <> stand with min assist  Skilled Therapeutic Interventions/Progress Updates:  Pt received sitting in w/c in room, agreeable to therapy session.  Pt distracted by his phone initially, but then did put in his pocket when asked by PT.  Had pt self propel to therapy gym x 150' using hemi technique with mod to max cuing to attend to L environment throughout.  Once in therapy gym, transferred to mat via stand pivot transfer mod A with cues for L foot placement and weight shift.  Noted that once sitting on mat, brief was sticking out of shorts and was wet with urine, therefore assisted back to room to address.  Performed standing to RW x 2 reps of about 2 mins each with mod to max A with +2 assist from tech to assist with peri care and donning new brief and shorts.  Pt  needed max verbal and tactile cuing to engage LLE during standing and upright posture as he had heavy L lateral lean most of standing.  Pt able to don new shirt (bottom had gotten wet) with min A to thread LLE.  Assisted back to therapy gym to perform gait with RW for improved postural control and LLE activation.  Performed short distance x 6' with RW at max A level (+2 for safety), however pt did not have good activation of LLE and PT unable to adequately assist with weight shift, therefore had him sit for rest break then ambulated again without use of RW (3 muskateer style).  Pt able to initially advance LLE, however with increased fatigue, PT had to assist with full clearance and placement.  Pt continually wanting to advance RLE without loading LLE, therefore PT made pt slow down and NOT step until PT cued him.  Note that light tactile cue on pts stomach "belly forward" worked very well for forward/lateral weight shift over LLE with PT also facilitating this and light blocking of LLE for safety.  Note good activation of L quad when posture was upright with forward weight shift, however if this did not occur, L knee remained flexed.  Cues for larger R step throughout and upright posture.  Performed 2 bouts  of gait without RW x 15' and x 20'.  Assisted pt back to room and transferred to recliner as above.  Seat belt alarm turned on and all needs in reach.    Therapy Documentation Precautions:  Precautions Precautions: Fall Precaution Comments: R eye ptosis  Restrictions Weight Bearing Restrictions: No   Pain:  No pain reported during session.    Therapy/Group: Individual Therapy  Denice Bors 08/25/2018, 12:44 PM

## 2018-08-25 NOTE — Progress Notes (Signed)
Occupational Therapy Session Note  Patient Details  Name: Todd Hood MRN: [id number] Date of Birth: 11-11-1975  Today's Date: 08/25/2018 OT Individual Time: 1410-1510 OT Individual Time Calculation (min): 60 min    Short Term Goals: Week 2:  OT Short Term Goal 1 (Week 2): Pt will demonstrate L UE HEP with paper handout and min cuing for technique. OT Short Term Goal 2 (Week 2): Pt will perform LB dressing with mod A. OT Short Term Goal 3 (Week 2): Pt will perform toileting with mod A.  Skilled Therapeutic Interventions/Progress Updates:    Treatment session with focus on functional transfers and LUE NMR.  Pt received upright in recliner agreeable to therapy session. Pt completed stand pivot transfer mod assist with facilitation for weight shift to advance LLE during transfer.  Squat pivot transfers w/c <> therapy mat with mod assist and cues for anterior weight shift.  Engaged in [place] in sitting with focus on WB through LUE while reaching across midline with RUE to further facilitate weight bearing.  Progressed to reaching with LUE with therapist providing support under elbow and along forearm to facilitate reach.  Pt demonstrating increased grasp to maintain grasp on cup and bean bags, but continues to require increased assistance to complete any functional tasks with items in hand.  Utilized dowel rod between BUE with focus on symmetrical movements and increased active ROM of LUE, therapist again providing tactile cues and support.  UE Ranger in sitting with focus on shoulder movement and elbow flexion/extension, therapist again providing tacitle cues and support to facilitate increased movement.  Upon return to room, completed squat pivot transfer with mod assist and left semi-reclined in bed with all needs in reach.  Therapy Documentation Precautions:  Precautions Precautions: Fall Precaution Comments: R eye ptosis  Restrictions Weight Bearing Restrictions: No Pain:  Pt with no c/o  pain   Therapy/Group: Individual Therapy  Simonne Come 08/25/2018, 3:24 PM

## 2018-08-26 ENCOUNTER — Inpatient Hospital Stay (HOSPITAL_COMMUNITY): Payer: Self-pay | Admitting: Physical Therapy

## 2018-08-26 ENCOUNTER — Inpatient Hospital Stay (HOSPITAL_COMMUNITY): Payer: Self-pay | Admitting: Occupational Therapy

## 2018-08-26 MED ORDER — POLYETHYLENE GLYCOL 3350 17 G PO PACK
17.0000 g | PACK | Freq: Two times a day (BID) | ORAL | Status: DC
  Administered 2018-08-26 – 2018-09-05 (×20): 17 g via ORAL
  Filled 2018-08-26 (×24): qty 1

## 2018-08-26 NOTE — Progress Notes (Signed)
Physical Therapy Session Note  Patient Details  Name: Todd Hood MRN: [id number] Date of Birth: 02/07/1975  Today's Date: 08/26/2018 PT Individual Time: 0800-0900 1300-1400 PT Individual Time Calculation (min): 60 min and 60 min  Short Term Goals: Week 2:  PT Short Term Goal 1 (Week 2): Pt will be able to gait x 30' with mod assist PT Short Term Goal 2 (Week 2): Pt will be able to perform 4 steps with rail with mod assist to prepare for home entry PT Short Term Goal 3 (Week 2): Pt will be able to perform sit <> stand with min assist  Skilled Therapeutic Interventions/Progress Updates:  Session 1  Pt received supine in bed and agreeable to PT. Supine>sit transfer with mod assist and max cues for improved pelvic mobility and attention to LUE and LLE. Sitting balance EOB with min progressing to supervision assist for PT to don shoes and LAFO. Pt performed squat pivot transfer with min assist and moderate cues for set up to [facility]. Gait training performed in rehab gym x 8f with Max A +2 for WC follow and max cues for improved R weight shift and quad activation to improve terminal knee extension. Additional gait training with RW and max A of 1 to force R weight shift and improve gait pattern on the L. PT instructed pt in forced WB through the RLE to perform cross body reach and then lateral reach with the RUE to target 2x8 in squat and 2 x 7 in standing with RW; moderate cues to maintain midline. Reciprocal stepping to 3 inch step with mod-max assist and max cues for attention the LLE and improve pelvic control to prevent compensations. Sqat pivot R and L with mod-max cues for safe set up and min assist from PT throughout treatment. Sit>supine into bed completed with min assist at the LLE, and left supine with call bell in reach and all needs met.   Session 2.   Pt received sitting in WC and agreeable to PT. Pt transported to rehab gym. Dynamic balance and pregait NMR to perform reciprocal stepping to  one of 2 targets on floor x 15 BLE and UE support on RW. Mod assist overall for weight shifting, improved terminal knee extension on L and improve WB through heel on L to improve stability of AFO. Gait training with RW and AFO x 138fwith mod-max assist + 2 for WC follow. Mac multimodal cues for midline orientation and R weight shift to allow advancement of the LLE.  WC mobility 2 x 15050fith supervision assist and min-mod cues for awareness of obstacles on the L and safety through doorway.  Squat pivot transfers to the R and L x 4 throughout treatment to various surfaces with mod cues for set up and safety.  Nustep reciprocal movement training x 8 min with BUE and BLE as well as 4 min with BLE only, L hand and thigh supports to improve positioning and moderate cues for full ROM and symmetry of movement. Pt returned to room and performed squat pivot transfer to bed with min assist as listed above. Sit>supine completed with min assist to the LLE, and left supine in bed with call bell in reach and all needs met.        Therapy Documentation Precautions:  Precautions Precautions: Fall Precaution Comments: R eye ptosis  Restrictions Weight Bearing Restrictions: No Vital Signs: Therapy Vitals Temp: 98.6 F (37 C) Pulse Rate: 73 Resp: 19 Patient Position (if appropriate): Lying Oxygen Therapy  O2 Device: Room Air  Pain :denies throughout treatments.   Therapy/Group: Individual Therapy  [name] 08/26/2018, 9:06 AM

## 2018-08-26 NOTE — Progress Notes (Signed)
Occupational Therapy Session Note  Patient Details  Name: Todd Hood MRN: [id number] Date of Birth: July 02, 1975  Today's Date: 08/26/2018 OT Individual Time: 1047-1200 OT Individual Time Calculation (min): 73 min   Short Term Goals: Week 2:  OT Short Term Goal 1 (Week 2): Pt will demonstrate L UE HEP with paper handout and min cuing for technique. OT Short Term Goal 2 (Week 2): Pt will perform LB dressing with mod A. OT Short Term Goal 3 (Week 2): Pt will perform toileting with mod A.    Skilled Therapeutic Interventions/Progress Updates:    Pt greeted in bed with no c/o pain. Amenable to shower. Min A for supine<sit and Mod A for squat pivot<w/c<TTB. He completed bathing (sit<stand from TTB) and dressing (w/c level sit<stand at sink) during session. Tx focus placed on Lt NMR, functional transfers, sit<stands and standing balance. Min vcs for sequencing during shower with HOH to functionally integrate L UE. Mod A for dynamic standing balance while he attempted perihygiene in the back. Tactile and manual cuing provided for keeping Lt knee stable due to buckling tendencies. He ultimately needed assist for hygiene. Able to maintain Lt hand grip on grab bar 50% of the time with setup. During dressing tasks, he required vcs for clothing orientation and hemi techniques. Able to utilize figure 4 bilaterally with supervision for balance. At times he was internally distracted and would pause during tasks to sing. Vcs for returning to task, otherwise he would sit in silence. Mod A for dynamic standing balance while he elevating pants over hips on Rt side. At end of session pt returned to w/c and was left with all needs within reach and safety belt fastened.   Therapy Documentation Precautions:  Precautions Precautions: Fall Precaution Comments: R eye ptosis  Restrictions Weight Bearing Restrictions: No Vital Signs: Therapy Vitals Temp: 98.7 F (37.1 C) Temp Source: Oral Pulse Rate: 62 Resp: (!)  22 BP: (!) 130/98 Patient Position (if appropriate): Lying Oxygen Therapy SpO2: 100 % O2 Device: Room Air ADL:       Therapy/Group: Individual Therapy  [name] A [name] 08/26/2018, 4:31 PM

## 2018-08-26 NOTE — Plan of Care (Signed)
  Problem: RH BOWEL ELIMINATION Goal: RH STG MANAGE BOWEL WITH ASSISTANCE Description: STG Manage Bowel with min Assistance. Outcome: Progressing Goal: RH STG MANAGE BOWEL W/MEDICATION W/ASSISTANCE Description: STG Manage Bowel with Medication with  mod I Assistance. Outcome: Progressing   Problem: RH BLADDER ELIMINATION Goal: RH STG MANAGE BLADDER WITH ASSISTANCE Description: STG Manage Bladder With Min Assistance Outcome: Progressing Goal: RH STG MANAGE BLADDER WITH EQUIPMENT WITH ASSISTANCE Description: STG Manage Bladder With Equipment With  Min Assistance Outcome: Progressing   Problem: RH PAIN MANAGEMENT Goal: RH STG PAIN MANAGED AT OR BELOW PT'S PAIN GOAL Description: Pt will be free of pain Outcome: Progressing   Problem: RH KNOWLEDGE DEFICIT GENERAL Goal: RH STG INCREASE KNOWLEDGE OF SELF CARE AFTER HOSPITALIZATION Description: Pt will be able to verbalize signs of stroke and ways to reduce risk for stroke along with compliance of medications and diet with supervision assist prior to DC Outcome: Progressing   Problem: RH Vision Goal: RH LTG Vision (Specify) Outcome: Progressing   

## 2018-08-26 NOTE — Progress Notes (Addendum)
[facility] PHYSICAL MEDICINE & REHABILITATION PROGRESS NOTE   Subjective/Complaints:  Patient seen at the bedside on rounds this morning.  No issues overnight.  Eating breakfast with no issues.  Patient says he has not had a bowel movement in the last few days.  Otherwise no complaints.  ROS: Patient denies  nausea, vomiting, diarrhea, cough, shortness of breath or chest pain,   Objective:   No results found. No results for input(s): WBC, HGB, HCT, PLT in the last 72 hours. No results for input(s): NA, K, CL, CO2, GLUCOSE, BUN, CREATININE, CALCIUM in the last 72 hours.  Intake/Output Summary (Last 24 hours) at 08/26/2018 0902 Last data filed at 08/25/2018 1857 Gross per 24 hour  Intake 260 ml  Output -  Net 260 ml     Physical Exam: Vital Signs Blood pressure (!) 128/93, pulse 73, temperature 98.6 F (37 C), resp. rate 19, height 5\' 9"  (1.753 m), weight 92 kg, SpO2 100 %.  Constitutional: No distress . Vital signs reviewed. HEENT: EOMI, oral membranes moist Neck: supple Cardiovascular: RRR without murmur. No JVD    Respiratory: CTA Bilaterally without wheezes or rales. Normal effort    GI: BS +, non-tender, non-distended  Musculoskeletal:     Comments: No edema or tenderness in extremities  Neurological: He is alert. Poor Left lid closure with reduced upward gaze  Motor: RUE/RLE: 5/5 proximal to distal LUE: Should abduction 1 to 1+/5, elbow flex/ext 2-/5, hand grip tr to 2/5.  LLE: HF, KE 3-/5   ADF/APF 0/5- significant spastic left hemiparesis Sensation decreased to LT but hypersensitive Skin: Skin is warm and dry.  Psychiatric: flat affect  Assessment/Plan: 1. Functional deficits secondary to Right thalamic infarct which require 3+ hours per day of interdisciplinary therapy in a comprehensive inpatient rehab setting.  Physiatrist is providing close team supervision and 24 hour management of active medical problems listed below.  Physiatrist and rehab team continue to  assess barriers to discharge/monitor patient progress toward functional and medical goals  Care Tool:  Bathing  Bathing activity did not occur: Refused Body parts bathed by patient: Chest, Abdomen, Front perineal area, Right upper leg, Left upper leg, Face, Right lower leg, Left arm   Body parts bathed by helper: Right arm, Buttocks, Left lower leg     Bathing assist Assist Level: Moderate Assistance - Patient 50 - 74%     Upper Body Dressing/Undressing Upper body dressing   What is the patient wearing?: Pull over shirt    Upper body assist Assist Level: Moderate Assistance - Patient 50 - 74%    Lower Body Dressing/Undressing Lower body dressing    Lower body dressing activity did not occur: Refused What is the patient wearing?: Pants, Incontinence brief     Lower body assist Assist for lower body dressing: Maximal Assistance - Patient 25 - 49%     Toileting Toileting    Toileting assist Assist for toileting: Maximal Assistance - Patient 25 - 49%     Transfers Chair/bed transfer  Transfers assist  Chair/bed transfer activity did not occur: Safety/medical concerns  Chair/bed transfer assist level: Moderate Assistance - Patient 50 - 74%     Locomotion Ambulation   Ambulation assist      Assist level: 2 helpers Assistive device: Walker-rolling(L AFO and L hand orthosis) Max distance: 30'   Walk 10 feet activity   Assist  Walk 10 feet activity did not occur: Safety/medical concerns  Assist level: 2 helpers Assistive device: [employer]-rolling   Walk  50 feet activity   Assist Walk 50 feet with 2 turns activity did not occur: Safety/medical concerns         Walk 150 feet activity   Assist Walk 150 feet activity did not occur: Safety/medical concerns         Walk 10 feet on uneven surface  activity   Assist Walk 10 feet on uneven surfaces activity did not occur: Safety/medical concerns         Wheelchair     Assist   Type of  Wheelchair: Manual    Wheelchair assist level: Minimal Assistance - Patient > 75% Max wheelchair distance: 120'    Wheelchair 50 feet with 2 turns activity    Assist        Assist Level: Minimal Assistance - Patient > 75%   Wheelchair 150 feet activity     Assist     Assist Level: Minimal Assistance - Patient > 75%    Medical Problem List and Plan: 1. Left hemiparesisand left lateral lean secondary to acute right thalamic, midbrain and internal capsule infarcts. Left homonymous hemianopsia with neglect due to prior R PCA infarct              -Continue CIR therapies including PT, OT, and SLP 2.  Antithrombotics: -DVT/anticoagulation:  Pharmaceutical: Lovenox             -antiplatelet therapy: ASA and Plavix x3 weeks followed by Plavix alone ~8/23 3. Pain Management: N/A 4. Mood: LCSW to follow for evaluation and support             -antipsychotic agents: N/A 5. Neuropsych: This patient is fully capable of making decisions on his own behalf. 6. Skin/Wound Care: Routine pressure relief measures 7. Fluids/Electrolytes/Nutrition: Monitor I/Os.                encourage fluids, po 8.  Prediabetes: Hemoglobin A1c is-6.2. Carb modified restrictions added to diet.                -AM cbg wnl so far 9.  Hypokalemia: potassium 3.8, continue K+ supp, recheck on 8/17 10.  Dyslipidemia: On Lipitor 11.  HTN: Permissive hypertension,   -began metoprolol 12.5 mg bid 8/7--- some improvement today---avoid over treatment. Stable   12. Substance abuse: Continue counseling 13. Constipation: No BM In 3 days, will schedule Miralax BID today.  - Continue colace daily  - Continue dulcolax suppository prn  Vitals:   08/25/18 1932 08/26/18 0537  BP: (!) 128/93   Pulse: 63 73  Resp: 18 19  Temp: 97.6 F (36.4 C) 98.6 F (37 C)  SpO2: 100%     LOS: 9 days A FACE TO FACE EVALUATION WAS PERFORMED  Patient was seen with Dr. Letta Pate.  Earlene Plater, MD Internal Medicine,  PGY1 08/26/2018,9:15 AM

## 2018-08-27 ENCOUNTER — Inpatient Hospital Stay (HOSPITAL_COMMUNITY): Payer: Self-pay | Admitting: Physical Therapy

## 2018-08-27 LAB — GLUCOSE, CAPILLARY: Glucose-Capillary: 88 mg/dL (ref 70–99)

## 2018-08-27 NOTE — Plan of Care (Signed)
  Problem: RH BOWEL ELIMINATION Goal: RH STG MANAGE BOWEL WITH ASSISTANCE Description: STG Manage Bowel with min Assistance. Outcome: Progressing Goal: RH STG MANAGE BOWEL W/MEDICATION W/ASSISTANCE Description: STG Manage Bowel with Medication with  mod I Assistance. Outcome: Progressing   Problem: RH BLADDER ELIMINATION Goal: RH STG MANAGE BLADDER WITH ASSISTANCE Description: STG Manage Bladder With Min Assistance Outcome: Progressing Goal: RH STG MANAGE BLADDER WITH EQUIPMENT WITH ASSISTANCE Description: STG Manage Bladder With Equipment With  Min Assistance Outcome: Progressing   Problem: RH PAIN MANAGEMENT Goal: RH STG PAIN MANAGED AT OR BELOW PT'S PAIN GOAL Description: Pt will be free of pain Outcome: Progressing   Problem: RH KNOWLEDGE DEFICIT GENERAL Goal: RH STG INCREASE KNOWLEDGE OF SELF CARE AFTER HOSPITALIZATION Description: Pt will be able to verbalize signs of stroke and ways to reduce risk for stroke along with compliance of medications and diet with supervision assist prior to DC Outcome: Progressing   Problem: RH Vision Goal: RH LTG Vision (Specify) Outcome: Progressing   

## 2018-08-27 NOTE — Progress Notes (Signed)
Physical Therapy Session Note  Patient Details  Name: Todd Hood MRN: [id number] Date of Birth: January 12, 1976  Today's Date: 08/27/2018 PT Individual Time: 1020-1105 PT Individual Time Calculation (min): 45 min   Short Term Goals: Week 2:  PT Short Term Goal 1 (Week 2): Pt will be able to gait x 30' with mod assist PT Short Term Goal 2 (Week 2): Pt will be able to perform 4 steps with rail with mod assist to prepare for home entry PT Short Term Goal 3 (Week 2): Pt will be able to perform sit <> stand with min assist  Skilled Therapeutic Interventions/Progress Updates:   Pt received supine in bed and agreeable to PT. Supine>sit transfer on th L with mod assist and cues for sequencing and proper UE placement to improve safety and independence. Squat pivot to the R with min assist and moderate cues for set up. PT donned pt shoes sitting in WC. WC mobility to rehab gym with supervision assist and moderate cues for awareness of walls and obstacles on the L side. Gait training with RW x 106f with max assist and + 2 for WC follow. Increased pushing on this day with dereased activation of the L quad to correct. Visual feed back of mirror provided and mild improvement to midline orientation. Pt performed squat pivot to mat table with min assist. Reciprocal LAQ x 12 BLE. Sit<>stand from EOB x 3 with min-mod assist and visual feedback from mirror for midline. Gait training without AD x 130fand max assist +2 for WC follow; mac cues for increased step length on the R, and noticeable improvement in L quad/glute activation in stance. Patient returned to room and [facility] in reach and all needs met.          Therapy Documentation Precautions:  Precautions Precautions: Fall Precaution Comments: R eye ptosis  Restrictions Weight Bearing Restrictions: No Vital Signs: Therapy Vitals Pulse Rate: 66 BP: (!) 123/94 Patient Position (if appropriate): Lying Oxygen Therapy SpO2: 100  % O2 Device: Room Air Pain: denies   Therapy/Group: Individual Therapy  AuLorie Phenix/15/2020, 11:12 AM

## 2018-08-28 LAB — GLUCOSE, CAPILLARY: Glucose-Capillary: 121 mg/dL — ABNORMAL HIGH (ref 70–99)

## 2018-08-28 NOTE — Progress Notes (Signed)
[place] PHYSICAL MEDICINE & REHABILITATION PROGRESS NOTE   Subjective/Complaints:  Patient without new complaints today.  Discussed with nursing no new issues  ROS: Patient denies  nausea, vomiting, diarrhea, cough, shortness of breath or chest pain,    Objective:   No results found. No results for input(s): WBC, HGB, HCT, PLT in the last 72 hours. No results for input(s): NA, K, CL, CO2, GLUCOSE, BUN, CREATININE, CALCIUM in the last 72 hours.  Intake/Output Summary (Last 24 hours) at 08/28/2018 1315 Last data filed at 08/28/2018 1130 Gross per 24 hour  Intake 594 ml  Output 1325 ml  Net -731 ml     Physical Exam: Vital Signs Blood pressure (!) 134/91, pulse 66, temperature 98.2 F (36.8 C), resp. rate 16, height 5\' 9"  (1.753 m), weight 92.4 kg, SpO2 99 %.  Constitutional: No distress . Vital signs reviewed. HEENT: EOMI, oral membranes moist Neck: supple Cardiovascular: RRR without murmur. No JVD    Respiratory: CTA Bilaterally without wheezes or rales. Normal effort    GI: BS +, non-tender, non-distended  Musculoskeletal:     Comments: No edema or tenderness in extremities  Neurological: He is alert. Poor Left lid closure with reduced upward gaze  Motor: RUE/RLE: 5/5 proximal to distal LUE: Should abduction 1 to 1+/5, elbow flex/ext 2-/5, hand grip tr to 1/5.  LLE: HF, KE 3-/5   ADF/APF 0/5- significant spastic left hemiparesis Sensation decreased to LT but hypersensitive Skin: Skin is warm and dry.  Psychiatric: flat   Assessment/Plan: 1. Functional deficits secondary to Right thalamic infarct which require 3+ hours per day of interdisciplinary therapy in a comprehensive inpatient rehab setting.  Physiatrist is providing close team supervision and 24 hour management of active medical problems listed below.  Physiatrist and rehab team continue to assess barriers to discharge/monitor patient progress toward functional and medical goals  Care Tool:  Bathing  Bathing activity did not occur: Refused Body parts bathed by patient: Chest, Abdomen, Front perineal area, Right upper leg, Left upper leg, Face, Right lower leg, Left arm   Body parts bathed by helper: Buttocks, Right arm, Left lower leg     Bathing assist Assist Level: Moderate Assistance - Patient 50 - 74%     Upper Body Dressing/Undressing Upper body dressing   What is the patient wearing?: Pull over shirt    Upper body assist Assist Level: Moderate Assistance - Patient 50 - 74%    Lower Body Dressing/Undressing Lower body dressing    Lower body dressing activity did not occur: Refused What is the patient wearing?: Pants, Incontinence brief     Lower body assist Assist for lower body dressing: Maximal Assistance - Patient 25 - 49%     Toileting Toileting    Toileting assist Assist for toileting: Maximal Assistance - Patient 25 - 49%     Transfers Chair/bed transfer  Transfers assist  Chair/bed transfer activity did not occur: Safety/medical concerns  Chair/bed transfer assist level: Minimal Assistance - Patient > 75%     Locomotion Ambulation   Ambulation assist      Assist level: 2 helpers Assistive device: Walker-rolling Max distance: 40   Walk 10 feet activity   Assist  Walk 10 feet activity did not occur: Safety/medical concerns  Assist level: 2 helpers Assistive device: Walker-rolling, Orthosis   Walk 50 feet activity   Assist Walk 50 feet with 2 turns activity did not occur: Safety/medical concerns         Walk 150 feet activity  Assist Walk 150 feet activity did not occur: Safety/medical concerns         Walk 10 feet on uneven surface  activity   Assist Walk 10 feet on uneven surfaces activity did not occur: Safety/medical concerns         Wheelchair     Assist   Type of Wheelchair: Manual    Wheelchair assist level: Supervision/Verbal cueing Max wheelchair distance: 150    Wheelchair 50 feet with 2  turns activity    Assist        Assist Level: Supervision/Verbal cueing   Wheelchair 150 feet activity     Assist     Assist Level: Supervision/Verbal cueing    Medical Problem List and Plan: 1. Left hemiparesisand left lateral lean secondary to  acute right thalamic, midbrain and internal capsule infarcts.   Left homonymous hemianopsia with neglect due to prior R PCA infarct              -Continue CIR therapies including PT, OT, and SLP, .  . 2.  Antithrombotics: -DVT/anticoagulation:  Pharmaceutical: Lovenox             -antiplatelet therapy: ASA and Plavix x3 weeks followed by Plavix alone ~8/23 3. Pain Management: N/A 4. Mood: LCSW to follow for evaluation and support             -antipsychotic agents: N/A 5. Neuropsych: This patient is ?fully capable of making decisions on his own behalf. 6. Skin/Wound Care: Routine pressure relief measures 7. Fluids/Electrolytes/Nutrition: Monitor I/Os.                encourage fluids, po 8.  Prediabetes: Hemoglobin A1c is-6.2. Carb modified restrictions added to diet.                -AM cbg wnl so far 9.  Hypokalemia: potassium 3.8, continue K+ supp, recheck monday 10.  Dyslipidemia: On Lipitor 11.  HTN: Permissive hypertension,   -began metoprolol 12.5 mg bid 8/7--- some improvement today---avoid over treatment Vitals:   08/27/18 2018 08/28/18 0514  BP: (!) 130/99 (!) 134/91  Pulse: 64 66  Resp: 16 16  Temp: 97.7 F (36.5 C) 98.2 F (36.8 C)  SpO2: 96% 99%  controlled 8/16 except mildly increased diastolic would not change medications at this point. 12. Substance abuse: Counsel 13. Constipation:improved  After sorbitol   -add colace as prophyllactic   -dulcolax suppository pr    LOS: 11 days A FACE TO FACE EVALUATION WAS PERFORMED  Erick Colacendrew E Gram Siedlecki 08/28/2018, 1:15 PM

## 2018-08-29 ENCOUNTER — Inpatient Hospital Stay (HOSPITAL_COMMUNITY): Payer: Self-pay | Admitting: Occupational Therapy

## 2018-08-29 ENCOUNTER — Inpatient Hospital Stay (HOSPITAL_COMMUNITY): Payer: Self-pay

## 2018-08-29 LAB — BASIC METABOLIC PANEL
Anion gap: 10 (ref 5–15)
BUN: 17 mg/dL (ref 6–20)
CO2: 23 mmol/L (ref 22–32)
Calcium: 9.4 mg/dL (ref 8.9–10.3)
Chloride: 108 mmol/L (ref 98–111)
Creatinine, Ser: 1.1 mg/dL (ref 0.61–1.24)
GFR calc Af Amer: >60 mL/min (ref >60)
GFR calc non Af Amer: >60 mL/min (ref >60)
Glucose, Bld: 140 mg/dL — ABNORMAL HIGH (ref 70–99)
Potassium: 3.9 mmol/L (ref 3.5–5.1)
Sodium: 141 mmol/L (ref 135–145)

## 2018-08-29 LAB — CBC
HCT: 44 % (ref 39.0–52.0)
Hemoglobin: 15.6 g/dL (ref 13.0–17.0)
MCH: 31 pg (ref 26.0–34.0)
MCHC: 35.5 g/dL (ref 30.0–36.0)
MCV: 87.3 fL (ref 80.0–100.0)
Platelets: 278 10*3/uL (ref 150–400)
RBC: 5.04 MIL/uL (ref 4.22–5.81)
RDW: 12.7 % (ref 11.5–15.5)
WBC: 8.3 10*3/uL (ref 4.0–10.5)
nRBC: 0 % (ref 0.0–0.2)

## 2018-08-29 LAB — GLUCOSE, CAPILLARY: Glucose-Capillary: 111 mg/dL — ABNORMAL HIGH (ref 70–99)

## 2018-08-29 MED ORDER — SORBITOL 70 % SOLN
60.0000 mL | Status: AC
  Administered 2018-08-29: 60 mL via ORAL
  Filled 2018-08-29: qty 60

## 2018-08-29 MED ORDER — METOPROLOL TARTRATE 25 MG PO TABS
25.0000 mg | ORAL_TABLET | Freq: Two times a day (BID) | ORAL | Status: DC
  Administered 2018-08-29 – 2018-09-07 (×18): 25 mg via ORAL
  Filled 2018-08-29 (×18): qty 1

## 2018-08-29 NOTE — Progress Notes (Addendum)
Physical Therapy Session Note  Patient Details  Name: Todd Hood MRN: [id number] Date of Birth: 23-Jul-1975  Today's Date: 08/29/2018 PT Individual Time: 0802-0901 PT Individual Time Calculation (min): 59 min   Short Term Goals: Week 1:  PT Short Term Goal 1 (Week 1): Pt will perform bed mobility with min Assist PT Short Term Goal 1 - Progress (Week 1): Met PT Short Term Goal 2 (Week 1): Pt will transfer to [facility] with mod assist consistently PT Short Term Goal 2 - Progress (Week 1): Met PT Short Term Goal 3 (Week 1): Pt will ambulate 94f with mod assist and LRAD PT Short Term Goal 3 - Progress (Week 1): Progressing toward goal PT Short Term Goal 4 (Week 1): Pt will propell WC [id number] supervision A PT Short Term Goal 4 - Progress (Week 1): Progressing toward goal PT Short Term Goal 5 (Week 1): Pt will initiate stair training. PT Short Term Goal 5 - Progress (Week 1): Met Week 2:  PT Short Term Goal 1 (Week 2): Pt will be able to gait x 30' with mod assist PT Short Term Goal 2 (Week 2): Pt will be able to perform 4 steps with rail with mod assist to prepare for home entry PT Short Term Goal 3 (Week 2): Pt will be able to perform sit <> stand with min assist Week 3:     Skilled Therapeutic Interventions/Progress Updates:      Therapy Documentation Precautions:  Precautions Precautions: Fall Precaution Comments: R eye ptosis  Restrictions Weight Bearing Restrictions: No    AM Session:  Pain: Pt c/o stomach ache due to inability to have bm but not able to quantify numerically.  "Just uncomfortable".  Treatment to tolerance, pt taken to BR x 2 to attempt BM due to feeling urge.  Therapeutic Exercise:  Standing midline orientation using mirror for feedback and cues for wt shift/ and L knee extension/ L hip protraction.  Standing tapping 1inch stool w/L foot to promote wt shifting to R w/min to mod assist for balance standing in front of mat.  Repeated reps of 5-6, 3 sets w/good  wt shifting to R achieved.  Therapeutic Activity:  Supine to sit on edge of bed w/min assist, verbal cues to safety and attention to L.  Bed to wc to R via stand pivot w/mod assist and cues for positioning of feet, safe sequencing.   wc to/from commode w/mod assist for balance/safety due to heavy tendency to lean L.  Stood for management of undergarments (up and down) w/verbal and visual cues for wt shifting to R/midline orientation, mod assist, and therapist stabilizing L knee.  This transfer repeated at end of session as described above.  Pt unable to produce bm w/either effort.   At end of session, pt transferred wc to bed stand pivot w/mod assist and cues for midline/wt shifting, use of rail.  Sit to supine w/max verbal cues for safety and min assist for LE management.  Pt left in bed w/alarm set, rails up x 3.    Gait:  10039f/L AFO, max assist of 2 to mod assist of 2 due to heavy tendency to lean L, verbal/tactile cues for  increasing base of support due to tendency to step at or across midline w/inittial contact L, increased knee flesion L thru stance, inadequate hip extension L from loading to terminal stance.  Pt gradually required decreasing assist and cues as gait progressed and noted pt cueing self for hip extension.  Assessment:  Increased gait endurance today. Anticipate pt would demonstrate improved gait if wt shifting done prior to gait vs following gait training as done in this am session.      Therapy/Group: Individual Therapy  Callie Fielding, PT   08/29/2018, 12:29 PM

## 2018-08-29 NOTE — Progress Notes (Signed)
Occupational Therapy Session Note  Patient Details  Name: Todd Hood MRN: [id number] Date of Birth: November 10, 1975  Today's Date: 08/29/2018 OT Individual Time: 1300-1359 OT Individual Time Calculation (min): 59 min    Short Term Goals: Week 2:  OT Short Term Goal 1 (Week 2): Pt will demonstrate L UE HEP with paper handout and min cuing for technique. OT Short Term Goal 2 (Week 2): Pt will perform LB dressing with mod A. OT Short Term Goal 3 (Week 2): Pt will perform toileting with mod A.  Skilled Therapeutic Interventions/Progress Updates:    Upon entering the room, pt on toilet with father in room providing supervision. Pt reports medication given to assist him with BM and "stomach is hurting". Pt able to empty bowels and standing with mod A and needing assistance for hygiene and clothing management. Pt returning to wheelchair with mod A stand pivot transfer. Pt washing hands at sink with supervision and mod cuing to attend to L UE. Pt's lunch present and he propelled wheelchair 100' with hemiplegic technique and min cuing to not hit items on L side. Pt seated at table in day room with set up to open packages. Pt able to cut food and feeds himself. Pt reports, " It's good to get out of my room and sit at a normal table for lunch." Pt returning to room at end of session for oral care secondary to pocketing noted. Pt seated in wheelchair with chair alarm belt donned and activated. Father present to provide supervision.  Therapy Documentation Precautions:  Precautions Precautions: Fall Precaution Comments: R eye ptosis  Restrictions Weight Bearing Restrictions: No Vital Signs: Therapy Vitals Temp: 99 F (37.2 C) Temp Source: Oral Pulse Rate: 80 Resp: 16 BP: (!) 144/107 Patient Position (if appropriate): Lying Oxygen Therapy SpO2: 99 % O2 Device: Room Air   Therapy/Group: Individual Therapy  Gypsy Decant 08/29/2018, 3:12 PM

## 2018-08-29 NOTE — Progress Notes (Signed)
[place] PHYSICAL MEDICINE & REHABILITATION PROGRESS NOTE   Subjective/Complaints:  Feeling constipated. Enema not helpful.  Objective:   No results found. Recent Labs    08/29/18 0744  WBC 8.3  HGB 15.6  HCT 44.0  PLT 278   Recent Labs    08/29/18 0744  NA 141  K 3.9  CL 108  CO2 23  GLUCOSE 140*  BUN 17  CREATININE 1.10  CALCIUM 9.4    Intake/Output Summary (Last 24 hours) at 08/29/2018 0934 Last data filed at 08/29/2018 0700 Gross per 24 hour  Intake 640 ml  Output 800 ml  Net -160 ml     Physical Exam: Vital Signs Blood pressure (!) 136/109, pulse 65, temperature 98.5 F (36.9 C), resp. rate 16, height 5\' 9"  (1.753 m), weight 92.4 kg, SpO2 100 %.  Constitutional: No distress . Vital signs reviewed. HEENT: EOMI, oral membranes moist Neck: supple Cardiovascular: RRR without murmur. No JVD    Respiratory: CTA Bilaterally without wheezes or rales. Normal effort    GI: BS +, non-tender, non-distended  Musculoskeletal:     Comments: No edema or tenderness in extremities  Neurological: He is alert. Poor Left lid closure with reduced upward gaze  Motor: RUE/RLE: 5/5 proximal to distal LUE: Should abduction 1 to 1+/5, elbow flex/ext 2-/5, hand grip tr to 1/5.  LLE: HF, KE 3-/5   ADF/APF 0/5- ongoing spastic left hemiparesis--reduced in standing Sensation decreased to LT left Skin: Skin is warm and dry.  Psychiatric: pleasant   Assessment/Plan: 1. Functional deficits secondary to Right thalamic infarct which require 3+ hours per day of interdisciplinary therapy in a comprehensive inpatient rehab setting.  Physiatrist is providing close team supervision and 24 hour management of active medical problems listed below.  Physiatrist and rehab team continue to assess barriers to discharge/monitor patient progress toward functional and medical goals  Care Tool:  Bathing  Bathing activity did not occur: Refused Body parts bathed by patient: Chest, Abdomen,  Front perineal area, Right upper leg, Left upper leg, Face, Right lower leg, Left arm   Body parts bathed by helper: Buttocks, Right arm, Left lower leg     Bathing assist Assist Level: Moderate Assistance - Patient 50 - 74%     Upper Body Dressing/Undressing Upper body dressing   What is the patient wearing?: Pull over shirt    Upper body assist Assist Level: Moderate Assistance - Patient 50 - 74%    Lower Body Dressing/Undressing Lower body dressing    Lower body dressing activity did not occur: Refused What is the patient wearing?: Pants, Incontinence brief     Lower body assist Assist for lower body dressing: Maximal Assistance - Patient 25 - 49%     Toileting Toileting    Toileting assist Assist for toileting: Maximal Assistance - Patient 25 - 49%     Transfers Chair/bed transfer  Transfers assist  Chair/bed transfer activity did not occur: Safety/medical concerns  Chair/bed transfer assist level: Minimal Assistance - Patient > 75%     Locomotion Ambulation   Ambulation assist      Assist level: 2 helpers Assistive device: Walker-rolling Max distance: 40   Walk 10 feet activity   Assist  Walk 10 feet activity did not occur: Safety/medical concerns  Assist level: 2 helpers Assistive device: Walker-rolling, Orthosis   Walk 50 feet activity   Assist Walk 50 feet with 2 turns activity did not occur: Safety/medical concerns         Walk 150  feet activity   Assist Walk 150 feet activity did not occur: Safety/medical concerns         Walk 10 feet on uneven surface  activity   Assist Walk 10 feet on uneven surfaces activity did not occur: Safety/medical concerns         Wheelchair     Assist   Type of Wheelchair: Manual    Wheelchair assist level: Supervision/Verbal cueing Max wheelchair distance: 150    Wheelchair 50 feet with 2 turns activity    Assist        Assist Level: Supervision/Verbal cueing    Wheelchair 150 feet activity     Assist     Assist Level: Supervision/Verbal cueing    Medical Problem List and Plan: 1. Left hemiparesisand left lateral lean secondary to  acute right thalamic, midbrain and internal capsule infarcts.    Ongoing Left homonymous hemianopsia with neglect due to prior R PCA infarct              -Continue CIR therapies including PT, OT, and SLP 2.  Antithrombotics: -DVT/anticoagulation:  Pharmaceutical: Lovenox             -antiplatelet therapy: ASA and Plavix x3 weeks followed by Plavix alone ~8/23 3. Pain Management: N/A 4. Mood: LCSW to follow for evaluation and support             -antipsychotic agents: N/A 5. Neuropsych: This patient is ?fully capable of making decisions on his own behalf. 6. Skin/Wound Care: Routine pressure relief measures 7. Fluids/Electrolytes/Nutrition: Monitor I/Os.                encourage fluids, po 8.  Prediabetes: Hemoglobin A1c is-6.2. Carb modified restrictions added to diet.                -AM cbg wnl at this point 9.  Hypokalemia: potassium 3.8, continue K+ supp, recheck monday 10.  Dyslipidemia: On Lipitor 11.  HTN: Permissive hypertension,   -began metoprolol 12.5 mg bid 8/7--- some improvement today---avoid over treatment Vitals:   08/28/18 2022 08/29/18 0441  BP: (!) 160/106 (!) 136/109  Pulse: 76 65  Resp: 18 16  Temp: 98.1 F (36.7 C) 98.5 F (36.9 C)  SpO2: 100% 100%    8/17 DBP remains poorly controlled. Increase metoprolol to 25mg  bid 12. Substance abuse: Counsel 13. Constipation: still no bm, feels bloated today  -continue senna-s   -dulcolax suppository pr  -try sorbitol and SSE today    LOS: 12 days A FACE TO FACE EVALUATION WAS PERFORMED  Ranelle OysterZachary T Swartz 08/29/2018, 9:34 AM

## 2018-08-29 NOTE — Plan of Care (Signed)
  Problem: RH BOWEL ELIMINATION Goal: RH STG MANAGE BOWEL WITH ASSISTANCE Description: STG Manage Bowel with min Assistance. Outcome: Progressing Goal: RH STG MANAGE BOWEL W/MEDICATION W/ASSISTANCE Description: STG Manage Bowel with Medication with  mod I Assistance. Outcome: Progressing   Problem: RH BLADDER ELIMINATION Goal: RH STG MANAGE BLADDER WITH ASSISTANCE Description: STG Manage Bladder With Min Assistance Outcome: Progressing Goal: RH STG MANAGE BLADDER WITH EQUIPMENT WITH ASSISTANCE Description: STG Manage Bladder With Equipment With  Min Assistance Outcome: Progressing   Problem: RH PAIN MANAGEMENT Goal: RH STG PAIN MANAGED AT OR BELOW PT'S PAIN GOAL Description: Pt will be free of pain Outcome: Progressing   Problem: RH KNOWLEDGE DEFICIT GENERAL Goal: RH STG INCREASE KNOWLEDGE OF SELF CARE AFTER HOSPITALIZATION Description: Pt will be able to verbalize signs of stroke and ways to reduce risk for stroke along with compliance of medications and diet with supervision assist prior to DC Outcome: Progressing   Problem: RH Vision Goal: RH LTG Vision (Specify) Outcome: Progressing   

## 2018-08-29 NOTE — Progress Notes (Signed)
Occupational Therapy Session Note  Patient Details  Name: Todd Hood MRN: [id number] Date of Birth: 1975-10-25  Today's Date: 08/29/2018 OT Individual Time: 8250-5397 OT Individual Time Calculation (min): 78 min   Short Term Goals: Week 2:  OT Short Term Goal 1 (Week 2): Pt will demonstrate L UE HEP with paper handout and min cuing for technique. OT Short Term Goal 2 (Week 2): Pt will perform LB dressing with mod A. OT Short Term Goal 3 (Week 2): Pt will perform toileting with mod A.     Skilled Therapeutic Interventions/Progress Updates:    Pt greeted in bed with no c/o pain. Just finished breakfast and agreeable to shower. Mod A for supine<sit. Mod A squat pivot<w/c<TTB with vcs for hand placement and technique. Pt required max vcs for sequencing due to deficits with Lt attention and sustained attention. Supervision for washing bilateral LEs using figure 4 position. Mod A for standing balance while he completed perihygiene. Lt knee supported at this time due to buckling and manual facilitation for Lt hand grasp on grab bar. HOH for integrating Lt functionally, with minimal active movement when washing his Rt forearm. Dressing was completed sit<stand at the sink afterwards. Mod vcs for motor planning and correct clothing orientation. Able to problem solve during UB dressing when he threaded both R UE and his head into the same hole. Assist for problem solving after threading both LEs into the same hole of pants. Min A for sit<stand and Mod A for standing balance while elevating pants over hips on Rt side. While sitting, HOH for incorporating L UE during oral care. Vcs for Lt attention during handwashing as he forgot to rinse soap off of hand before reaching for paper towel to dry. Pt remained in w/c with all needs within reach and safety belt fastened. Tx focus placed on Lt NMR, sitting/standing balance, praxis, visual scanning with head turns, and ADL retraining.    Therapy  Documentation Precautions:  Precautions Precautions: Fall Precaution Comments: R eye ptosis  Restrictions Weight Bearing Restrictions: No ADL:        Therapy/Group: Individual Therapy  Todd Hood A Todd Hood 08/29/2018, 12:02 PM

## 2018-08-30 ENCOUNTER — Inpatient Hospital Stay (HOSPITAL_COMMUNITY): Payer: Self-pay | Admitting: Physical Therapy

## 2018-08-30 ENCOUNTER — Inpatient Hospital Stay (HOSPITAL_COMMUNITY): Payer: Self-pay | Admitting: Occupational Therapy

## 2018-08-30 ENCOUNTER — Ambulatory Visit (HOSPITAL_COMMUNITY): Payer: Self-pay | Admitting: *Deleted

## 2018-08-30 LAB — GLUCOSE, CAPILLARY: Glucose-Capillary: 109 mg/dL — ABNORMAL HIGH (ref 70–99)

## 2018-08-30 NOTE — Progress Notes (Signed)
[facility] PHYSICAL MEDICINE & REHABILITATION PROGRESS NOTE   Subjective/Complaints:  No issues overnite , finally had BM with sorbitol and enema   ROS- neg CP, SOB, N/V/D  Objective:   No results found. Recent Labs    08/29/18 0744  WBC 8.3  HGB 15.6  HCT 44.0  PLT 278   Recent Labs    08/29/18 0744  NA 141  K 3.9  CL 108  CO2 23  GLUCOSE 140*  BUN 17  CREATININE 1.10  CALCIUM 9.4    Intake/Output Summary (Last 24 hours) at 08/30/2018 0914 Last data filed at 08/30/2018 0700 Gross per 24 hour  Intake 358 ml  Output 300 ml  Net 58 ml     Physical Exam: Vital Signs Blood pressure 131/84, pulse 80, temperature 98.6 F (37 C), temperature source Oral, resp. rate 19, height 5\' 9"  (1.753 m), weight 90.1 kg, SpO2 99 %.  Constitutional: No distress . Vital signs reviewed. HEENT: EOMI, oral membranes moist Neck: supple Cardiovascular: RRR without murmur. No JVD    Respiratory: CTA Bilaterally without wheezes or rales. Normal effort    GI: BS +, non-tender, non-distended  Musculoskeletal:     Comments: No edema or tenderness in extremities  Neurological: He is alert. Poor Left lid closure with reduced upward gaze  Motor: RUE/RLE: 5/5 proximal to distal LUE: Should abduction 1 to 1+/5, elbow flex/ext 2-/5, hand grip tr to 1/5.  LLE: HF, KE 3-/5   ADF/APF 0/5- ongoing spastic left hemiparesis--reduced in standing Sensation decreased to LT left Skin: Skin is warm and dry.  Psychiatric: pleasant   Assessment/Plan: 1. Functional deficits secondary to Right thalamic infarct which require 3+ hours per day of interdisciplinary therapy in a comprehensive inpatient rehab setting.  Physiatrist is providing close team supervision and 24 hour management of active medical problems listed below.  Physiatrist and rehab team continue to assess barriers to discharge/monitor patient progress toward functional and medical goals  Care Tool:  Bathing  Bathing activity did not  occur: Refused Body parts bathed by patient: Chest, Abdomen, Front perineal area, Right upper leg, Left upper leg, Face, Right lower leg, Left arm, Left lower leg   Body parts bathed by helper: Buttocks, Right arm     Bathing assist Assist Level: Moderate Assistance - Patient 50 - 74%     Upper Body Dressing/Undressing Upper body dressing   What is the patient wearing?: Pull over shirt    Upper body assist Assist Level: Moderate Assistance - Patient 50 - 74%    Lower Body Dressing/Undressing Lower body dressing    Lower body dressing activity did not occur: Refused What is the patient wearing?: Pants, Incontinence brief     Lower body assist Assist for lower body dressing: Moderate Assistance - Patient 50 - 74%     Toileting Toileting    Toileting assist Assist for toileting: Moderate Assistance - Patient 50 - 74%     Transfers Chair/bed transfer  Transfers assist  Chair/bed transfer activity did not occur: Safety/medical concerns  Chair/bed transfer assist level: Moderate Assistance - Patient 50 - 74%     Locomotion Ambulation   Ambulation assist      Assist level: 2 helpers Assistive device: Walker-rolling Max distance: 100   Walk 10 feet activity   Assist  Walk 10 feet activity did not occur: Safety/medical concerns  Assist level: 2 helpers Assistive device: Walker-rolling, Orthosis   Walk 50 feet activity   Assist Walk 50 feet with 2 turns  activity did not occur: Safety/medical concerns  Assist level: 2 helpers      Walk 150 feet activity   Assist Walk 150 feet activity did not occur: Safety/medical concerns         Walk 10 feet on uneven surface  activity   Assist Walk 10 feet on uneven surfaces activity did not occur: Safety/medical concerns         Wheelchair     Assist   Type of Wheelchair: Manual    Wheelchair assist level: Supervision/Verbal cueing Max wheelchair distance: 150    Wheelchair 50 feet with 2  turns activity    Assist        Assist Level: Supervision/Verbal cueing   Wheelchair 150 feet activity     Assist     Assist Level: Supervision/Verbal cueing    Medical Problem List and Plan: 1. Left hemiparesisand left lateral lean secondary to  acute right thalamic, midbrain and internal capsule infarcts.    Team conf in am              -Continue CIR therapies including PT, OT, and SLP 2.  Antithrombotics: -DVT/anticoagulation:  Pharmaceutical: Lovenox             -antiplatelet therapy: ASA and Plavix x3 weeks followed by Plavix alone ~8/23 3. Pain Management: N/A 4. Mood: LCSW to follow for evaluation and support             -antipsychotic agents: N/A 5. Neuropsych: This patient is ?fully capable of making decisions on his own behalf. 6. Skin/Wound Care: Routine pressure relief measures 7. Fluids/Electrolytes/Nutrition: Monitor I/Os.                encourage fluids, po 8.  Prediabetes: Hemoglobin A1c is-6.2. Carb modified restrictions added to diet.                -AM cbg wnl at this point 9.  Hypokalemia: potassium supplementation , 3.9 on 8/17 10.  Dyslipidemia: On Lipitor 11.  HTN: Permissive hypertension,   -began metoprolol 12.5 mg bid 8/7--- some improvement today---avoid over treatment Vitals:   08/29/18 2033 08/30/18 0500  BP: (!) 126/92 131/84  Pulse: 90 80  Resp:  19  Temp: 99 F (37.2 C) 98.6 F (37 C)  SpO2: 100% 99%    8/17  Increased metoprolol to 25mg  bid, improved with HR still at 80bpm  12. Substance abuse: Counsel 13. Constipation: still no bm, feels bloated today  -continue senna-s   -dulcolax suppository pr  - sorbitol and SSE reulted in 2 lg BM     LOS: 13 days A FACE TO [place] E  08/30/2018, 9:14 AM

## 2018-08-30 NOTE — Progress Notes (Signed)
Physical Therapy Session Note  Patient Details  Name: Todd Hood MRN: [id number] Date of Birth: 1975-12-22  Today's Date: 08/30/2018 PT Individual Time: 1655-3748 and 1420-1535 PT Individual Time Calculation (min): 56 min and 75 min  Short Term Goals: Week 2:  PT Short Term Goal 1 (Week 2): Pt will be able to gait x 30' with mod assist PT Short Term Goal 2 (Week 2): Pt will be able to perform 4 steps with rail with mod assist to prepare for home entry PT Short Term Goal 3 (Week 2): Pt will be able to perform sit <> stand with min assist  Skilled Therapeutic Interventions/Progress Updates: Pt presented in w/c agreeable to therapy. Pt denies pain at start of session. Pt propelled w/c to day room with modA and max cues for maintaining straight trajectory and avoiding objects on L. Performed squat pivot to mat modA. Pt participated in standing toe taps to target with LLE for stading balance and coordination. Performed toe taps to target with PTA blocking L knee and providing facilitation of quad for increased activation. Pt was able to demonstrate improved midline orientation with decreased L lateral lean with this activity. Discussed practicing toe taps with LLE in w/c 5-10 reps 2-3 x/day. Performed STS with 4in step placed under RLE for forced use of LLE. Pt required mod multimodal cues for initial placement of LLE as pt would try to place LLE behind block. Pt performed squat pivot to L to return to w/c with minA near CGA and pt transported partial distance and propelled remaining distance back to room. Pt noted to propel with more accuracy and decreased drift to L at end of session. Pt left remained in w/c at end of session with belt alarm on, call bell within reach and needs met.    Tx2:  Pt presented in bed with father present agreeable to therapy. Pt denies pain throughout session. Pt performed supine to sit on L side with min to modA and increased time. PTA donned shoes/AFO total A. Performed  squat pivot to R CGA with increased cues for sequencing. Pt propelled to rehab gym minA with max cues for L scanning and avoiding objects on L. Performed squat pivot to R CGA. Participated in gait training x 3f with maxA and +1 for w/c follow. PTA required to providing verbal cues for stepping L, wt shifting to L, PTA blocking L knee and adjustment of L foot once placed on ground. Pt requiring verbal cues each step to decrease L lean and only intermittent activation of quad noted in stance phase. Pt returned to w/c and participated in dynamic standing balance attempting ball taps however pt unable to safely wt shift and maintain L knee extension on this attempt. Pt participated in ball kick alternating feet for forced wt shifting with improved results and PTA noting mild contraction of quad during activity. Pt transported to day room and participated in Cybex Kinetron 50cm/sec x 5 min for reciprocal activity and endurance. Pt transported back to room at end of session and remained in w/c with belt alarm on, call bell within reach and needs met.      Therapy Documentation Precautions:  Precautions Precautions: Fall Precaution Comments: R eye ptosis  Restrictions Weight Bearing Restrictions: No   Therapy/Group: Individual Therapy  Correen Bubolz  Luvenia Cranford, PTA  08/30/2018, 4:09 PM

## 2018-08-30 NOTE — Progress Notes (Signed)
Occupational Therapy Session Note  Patient Details  Name: Todd Hood MRN: [id number] Date of Birth: 21-Aug-1975  Today's Date: 08/30/2018 OT Individual Time: 5956-3875 OT Individual Time Calculation (min): 56 min    Short Term Goals: Week 2:  OT Short Term Goal 1 (Week 2): Pt will demonstrate L UE HEP with paper handout and min cuing for technique. OT Short Term Goal 2 (Week 2): Pt will perform LB dressing with mod A. OT Short Term Goal 3 (Week 2): Pt will perform toileting with mod A.  Skilled Therapeutic Interventions/Progress Updates:    Upon entering the room, pt supine in bed with no c/o pain and agreeable to OT intervention. Pt declined bathing and dressing this session. Pt performed supine >sit with min A to EOB. Pt required assistance for figure four position and min - mod A sitting balance when attempting one handed technique to don B socks this session. Pt transferred into wheelchair with mod A stand pivot transfer. Pt singing during this session and music list of favorite artist played during this session. Pt performed grooming tasks with supervision and set up a to obtain all needed items at sink. Pt able to demonstrate AROM x 10 reps extension/flex of digits on commands. Pt needing assistance to fully stretch L UE for full elbow extension and supination/pronation. Pt remained in wheelchair with chair alarm activated with call bell and all needed items within reach upon exiting the room.   Therapy Documentation Precautions:  Precautions Precautions: Fall Precaution Comments: R eye ptosis  Restrictions Weight Bearing Restrictions: No   Therapy/Group: Individual Therapy  Gypsy Decant 08/30/2018, 9:42 AM

## 2018-08-30 NOTE — Progress Notes (Signed)
Recreational Therapy Session Note  Patient Details  Name: Todd Hood MRN: [id number] Date of Birth: Dec 22, 1975 Today's Date: 08/30/2018 Time:  7681-1572 Pain: no c/o Skilled Therapeutic Interventions/Progress Updates: Session focused on activity tolerance, midline stance, dynamic standing balance during co-treat with PT.  Pt ambulated with +2 assist using RW, mod instructional cues.  Transitioned to standing activities with RW including ball tap using RUE and then kicking a ball alternating feet with mod-max assist for L knee control and weightbearing through LLE.  Therapy/Group: Co-Treatment  Julieana Eshleman 08/30/2018, 3:43 PM

## 2018-08-31 ENCOUNTER — Ambulatory Visit (HOSPITAL_COMMUNITY): Payer: Self-pay | Admitting: *Deleted

## 2018-08-31 ENCOUNTER — Inpatient Hospital Stay (HOSPITAL_COMMUNITY): Payer: Self-pay | Admitting: Occupational Therapy

## 2018-08-31 ENCOUNTER — Encounter (HOSPITAL_COMMUNITY): Payer: Self-pay | Admitting: Psychology

## 2018-08-31 DIAGNOSIS — F482 Pseudobulbar affect: Secondary | ICD-10-CM

## 2018-08-31 LAB — GLUCOSE, CAPILLARY: Glucose-Capillary: 99 mg/dL (ref 70–99)

## 2018-08-31 NOTE — Consult Note (Signed)
Neuropsychological Consultation   Patient:   Todd Hood   DOB:   [birth year]  MR Number:  [id number]  Location:  [place] [facility] [place] [id number] [place] [address] Dept: [phone] Loc: [phone]           Date of Service:   08/31/2018  Start Time:   2:30 PM End Time:   3:30 PM  Provider/Observer:  Ilean Skill, Psy.D.       Clinical Neuropsychologist       Billing Code/Service: 96158/96159  Chief Complaint:    Todd Hood is a 43 year old male with history of HTN, R-CVA 02/2018 with residual mild left- sided weakness and left peripheral vision loss who was admitted on 08/14/2018 after a fall hitting his head and eye on air conditioner due to left-sided weakness.  Patient reports that he had been having issues night before but did not address it immediately.  After falling, he could not get back up and called to a relative across the hall to come and help.  Patient with left sided weakness, left facial weakness and dysarthria.  Patient with question about compliance of blood thinners prior to stroke.  MRI revealed acute right thalamic, midbrain and internal capsule infarcts.  UDS positive for THC.  Dr. Leonie Man felt that stroke embolic in nature of unknown source.    8/19:  Patient was sleeping when entered room.  He was hard to awake and orient.  Once oriented he showed good mental status.  Showed his improving movement in left hand.  Reports that he is continuing to symptoms consistent with pseudobulbar affect.  Reports crying when hearing a song or any thoughts about family/friends.  Reports not changing much over last week.    Reason for Service:  HPI: Todd Hood is a 43 year old male with history of HTN, R-CVA 2/20 with residual mild left-sided weakness and left peripheral vision loss; who was admitted on 08/14/2018 after a fall, htting the back of his head with left-sided weakness, left facial  weakness, and dysarthria. History taken from chart review and patient. Patient had stopped taking his Plavix due to issues with dental pain.  CTA head neck/perfusion was negative for LVO ischemia and showed chronic right PCA occlusion with right-PCA territory encephalomalacia. MRI brain done revealing acute right thalamic, midbrain and internal capsule infarcts.  UDS was positive for THC. BLE Dopplers negative for DVT.   He had 30-day event monitor earlier this year that was negative for A. fib.  Dr. Leonie Man felt that stroke was embolic due to unknown source and loop recorder recommended however patient declined this.  He had TEE on 08/16/2018 showing EF of 50-60% with intra-atrial septal aneurysm.  No shunting or thrombus noted.   Patient continues to be limited by left hemiparesis with right proptosis, left hand anonymous hemianopsia and left lateral lean with standing.  Therapy ongoing and CIR recommended due to functional decline. Please see preadmission assessment from today as well.  Current Status:  Patient reports continued significant motor deficits on left side but is able to move all fingers now.  Denies depression or depressive symptoms that are sustained but has abrupt onset of crying when hearing certain songs or thinking about family/friends.  Reports no change over past week in these symptoms.  Behavioral Observation: Todd Hood  presents as a 43 y.o.-year-old Right African American Male who appeared his stated age. his dress was Appropriate and he was  Well Groomed and his manners were Appropriate to the situation.  his participation was indicative of Appropriate and Attentive behaviors.  There were any physical disabilities noted.  he displayed an appropriate level of cooperation and motivation.     Interactions:    Active Appropriate and Attentive  Attention:   improving from last visit and mostly due to internal preoccupations.  Memory:   within normal limits; recent and remote  memory intact  Visuo-spatial:  not examined  Speech (Volume):  low  Speech:   normal; slurred  Thought Process:  Coherent and Relevant  Though Content:  WNL; not suicidal and not homicidal  Orientation:   person, place, time/date and situation  Judgment:   Fair  Planning:   Fair  Affect:    Depressed  Mood:    Dysphoric  Insight:   Fair  Intelligence:   normal  Medical History:   Past Medical History:  Diagnosis Date  . CVA (cerebral vascular accident) (HCC)   . Hypertension      Psychiatric History:  Patient denies past psychiatric history but admitts that he did not always take his medications like he should and had started eating a poor diet of take out food due to working a lot and covid restrictions.    Family Med/Psych History:  Family History  Problem Relation Age of Onset  . Diabetes Mother   . Diabetes Father   . Sickle cell trait Father   . Stroke Father    Impression/DX:  Todd Hood is a 43 year old male with history of HTN, R-CVA 02/2018 with residual mild left- sided weakness and left peripheral vision loss who was admitted on 08/14/2018 after a fall hitting his head and eye on air conditioner due to left-sided weakness.  Patient reports that he had been having issues night before but did not address it immediately.  After falling, he could not get back up and called to a relative across the hall to come and help.  Patient with left sided weakness, left facial weakness and dysarthria.  Patient with question about compliance of blood thinners prior to stroke.  MRI revealed acute right thalamic, midbrain and internal capsule infarcts.  UDS positive for THC.  Dr. Pearlean BrownieSethi felt that stroke embolic in nature of unknown source.    Patient with significant motor defecits on left side and continued swelling of right eye and stressed about not being able to see GF as she was helping him after last stroke.  Patient only able to have one family member and while he is  stressed about not being able to se GF his father is retired and patient knows it is best his father is that family member to come see him is hospital.  Patient reports continued significant motor deficits on left side but is able to move all fingers now.  Denies depression or depressive symptoms that are sustained but has abrupt onset of crying when hearing certain songs or thinking about family/friends.  Reports no change over past week in these symptoms.  Disposition/Plan:  Will followup with patient next week.  Diagnosis:    Right Thalamic Stroke with possible pseudobulbar affect        Electronically Signed   _______________________ Arley PhenixJohn Shenee Wignall, Psy.D.

## 2018-08-31 NOTE — Patient Care Conference (Signed)
Inpatient RehabilitationTeam Conference and Plan of Care Update Date: 08/31/2018   Time: 10;40 AM    Patient Name: Todd Hood      Medical Record Number: [id number]  Date of Birth: 07/03/1975 Sex: Male         Room/Bed: 4W05C/4W05C-01 Payor Info: Payor: /    Admitting Diagnosis: 5. CVA 1 Team  RT. CVA; 22-24days  Admit Date/Time:  08/17/2018  6:39 PM Admission Comments: No comment available   Primary Diagnosis:  Right thalamic stroke [facility](HCC) Principal Problem: Right thalamic stroke [facility](HCC)  Patient Active Problem List   Diagnosis Date Noted  . Right thalamic stroke (HCC) 08/17/2018  . Dyslipidemia   . Hypokalemia   . Hemiparesis affecting left side as late effect of stroke (HCC)   . HTN (hypertension) 08/15/2018  . Prediabetes 08/15/2018  . Marijuana use 08/15/2018  . Acute ischemic stroke (HCC) 08/14/2018    Expected Discharge Date: Expected Discharge Date: 09/07/18  Team Members Present: Physician leading conference: Dr. Claudette LawsAndrew Kirsteins Social Worker Present: Dossie DerBecky Geovonni Meyerhoff, LCSW Nurse Present: Regino SchultzeHilary Lilja, RN PT Present: Harless Littenosita Dechalus, PTA;Grier RocherAustin Tucker, PT OT Present: Jackquline DenmarkKatie Bradsher, OT SLP Present: Feliberto Gottronourtney Payne, SLP PPS Coordinator present : Fae PippinMelissa Bowie, SLP     Current Status/Progress Goal Weekly Team Focus  Medical   left neglect, incont of bladder , bowel constip .  Mod A ADLs  inmprove continenece ,  d/c planning   Bowel/Bladder   Pt continent B/B. LBM 08/29/2018  Maintain regular bowel pattern. Continue to encourage use of various laxatives and stool softeners.  Assist with toileting needs PRN   Swallow/Nutrition/ Hydration             ADL's   Mod A bathing at shower level, Mod A UB/LB dressing, Mod A squat pivot toilet + shower transfers. Toileting tasks completed with Stedy. Lt inattention + Lt knee buckling in standing during functional activity  Min A overall  Lt NMR, Lt attention, sitting/standing balance, ADL retraining, sustained attention/motor  planning, visual redmediation/compensation using head turns   Mobility   minA bed mobilty, minA to CGA squat pivot, min/modA w/c mobility due to decreased L attention  minA overall, supervision w/c mobility  L NMR, balance, gait   Communication             Safety/Cognition/ Behavioral Observations            Pain   No complaints of pain  Remain pain free  Assess pain Q shift and PRN   Skin   No skin issues  Maintain skin integrity and prevent skin breakdown  Assess skin Q shift and PRN      *See Care Plan and progress notes for long and short-term goals.     Barriers to Discharge  Current Status/Progress Possible Resolutions Date Resolved   Physician    Medical stability     while strenght is improving , fxn is not thus far  work on left lean, left neglect      Nursing                  PT                    OT                  SLP                SW  Discharge Planning/Teaching Needs:  Will need to begin education with caregivers to make sure comfortable with pt's care at DC. Pt's fahter is his designated visitor but will need to have GF and cousin come in if they are his caregivers at DC      Team Discussion:  Making progress in his therapies, still working toward in assist level. l-inattention and lean needs to be cued. Movement in hand now. Knee buckles still. AFO ordered. Medically better off aspirin 8/23 then only plavix. Downgraded goals to min assist overall. Need to begin family training with dad, girlfriend and cousin  Revisions to Treatment Plan:  DC 8/26 downgraded goals-min assist    Continued Need for Acute Rehabilitation Level of Care: The patient requires daily medical management by a physician with specialized training in physical medicine and rehabilitation for the following conditions: Daily direction of a multidisciplinary physical rehabilitation program to ensure safe treatment while eliciting the highest outcome that is of practical  value to the patient.: Yes Daily medical management of patient stability for increased activity during participation in an intensive rehabilitation regime.: Yes Daily analysis of laboratory values and/or radiology reports with any subsequent need for medication adjustment of medical intervention for : Neurological problems   I attest that I was present, lead the team conference, and concur with the assessment and plan of the team. Teleconference held due to COVID 19   Desire Fulp, Gardiner Rhyme 08/31/2018, 2:57 PM

## 2018-08-31 NOTE — Progress Notes (Signed)
[place] PHYSICAL MEDICINE & REHABILITATION PROGRESS NOTE   Subjective/Complaints:  No issues overnite, we discussed purpose of AFO, pt states it feels heavy and has problems with walking   ROS- neg CP, SOB, N/V/D  Objective:   No results found. Recent Labs    08/29/18 0744  WBC 8.3  HGB 15.6  HCT 44.0  PLT 278   Recent Labs    08/29/18 0744  NA 141  K 3.9  CL 108  CO2 23  GLUCOSE 140*  BUN 17  CREATININE 1.10  CALCIUM 9.4    Intake/Output Summary (Last 24 hours) at 08/31/2018 0805 Last data filed at 08/31/2018 0430 Gross per 24 hour  Intake 360 ml  Output 350 ml  Net 10 ml     Physical Exam: Vital Signs Blood pressure 112/78, pulse 72, temperature 98.6 F (37 C), temperature source Oral, resp. rate 12, height 5' 9"  (1.753 m), weight 90.1 kg, SpO2 100 %.  Constitutional: No distress . Vital signs reviewed. HEENT: EOMI, oral membranes moist Neck: supple Cardiovascular: RRR without murmur. No JVD    Respiratory: CTA Bilaterally without wheezes or rales. Normal effort    GI: BS +, non-tender, non-distended  Musculoskeletal:     Comments: No edema or tenderness in extremities  Neurological: He is alert. Poor Left lid closure with reduced upward gaze  Motor: RUE/RLE: 5/5 proximal to distal LUE: Should abduction 1 to 1+/5, elbow flex/ext 2-/5, hand grip tr to 1/5.  LLE: HF, KE 3-/5   ADF/APF 0/5- ongoing spastic left hemiparesis--reduced in standing Sensation decreased to LT left Skin: Skin is warm and dry.  Psychiatric: pleasant   Assessment/Plan: 1. Functional deficits secondary to Right thalamic infarct which require 3+ hours per day of interdisciplinary therapy in a comprehensive inpatient rehab setting.  Physiatrist is providing close team supervision and 24 hour management of active medical problems listed below.  Physiatrist and rehab team continue to assess barriers to discharge/monitor patient progress toward functional and medical goals  Care  Tool:  Bathing  Bathing activity did not occur: Refused Body parts bathed by patient: Chest, Abdomen, Front perineal area, Right upper leg, Left upper leg, Face, Right lower leg, Left arm, Left lower leg   Body parts bathed by helper: Buttocks, Right arm     Bathing assist Assist Level: Moderate Assistance - Patient 50 - 74%     Upper Body Dressing/Undressing Upper body dressing   What is the patient wearing?: Pull over shirt    Upper body assist Assist Level: Moderate Assistance - Patient 50 - 74%    Lower Body Dressing/Undressing Lower body dressing    Lower body dressing activity did not occur: Refused What is the patient wearing?: Pants, Incontinence brief     Lower body assist Assist for lower body dressing: Moderate Assistance - Patient 50 - 74%     Toileting Toileting    Toileting assist Assist for toileting: Moderate Assistance - Patient 50 - 74%     Transfers Chair/bed transfer  Transfers assist  Chair/bed transfer activity did not occur: Safety/medical concerns  Chair/bed transfer assist level: Moderate Assistance - Patient 50 - 74%     Locomotion Ambulation   Ambulation assist      Assist level: 2 helpers Assistive device: Walker-rolling Max distance: 100   Walk 10 feet activity   Assist  Walk 10 feet activity did not occur: Safety/medical concerns  Assist level: 2 helpers Assistive device: Walker-rolling, Orthosis   Walk 50 feet activity  Assist Walk 50 feet with 2 turns activity did not occur: Safety/medical concerns  Assist level: 2 helpers      Walk 150 feet activity   Assist Walk 150 feet activity did not occur: Safety/medical concerns         Walk 10 feet on uneven surface  activity   Assist Walk 10 feet on uneven surfaces activity did not occur: Safety/medical concerns         Wheelchair     Assist   Type of Wheelchair: Manual    Wheelchair assist level: Supervision/Verbal cueing Max wheelchair  distance: 150    Wheelchair 50 feet with 2 turns activity    Assist        Assist Level: Supervision/Verbal cueing   Wheelchair 150 feet activity     Assist     Assist Level: Supervision/Verbal cueing    Medical Problem List and Plan: 1. Left hemiparesisand left lateral lean secondary to  acute right thalamic, midbrain and internal capsule infarcts.    Team conference today please see physician documentation under team conference tab, met with team face-to-face to discuss problems,progress, and goals. Formulized individual treatment plan based on medical history, underlying problem and comorbidities.             -Continue CIR therapies including PT, OT, and SLP 2.  Antithrombotics: -DVT/anticoagulation:  Pharmaceutical: Lovenox             -antiplatelet therapy: ASA and Plavix x3 weeks followed by Plavix alone ~8/23 3. Pain Management: N/A 4. Mood: LCSW to follow for evaluation and support             -antipsychotic agents: N/A 5. Neuropsych: This patient is ?fully capable of making decisions on his own behalf. 6. Skin/Wound Care: Routine pressure relief measures 7. Fluids/Electrolytes/Nutrition: Monitor I/Os.                encourage fluids, po 8.  Prediabetes: Hemoglobin A1c is-6.2. Carb modified restrictions added to diet.                -AM cbg wnl at this point 9.  Hypokalemia:improved with  potassium supplementation , 3.9 on 8/17 10.  Dyslipidemia: On Lipitor 11.  HTN: Permissive hypertension,   -began metoprolol 12.5 mg bid 8/7--- some improvement today---avoid over treatment Vitals:   08/30/18 1946 08/31/18 0602  BP: (!) 134/98 112/78  Pulse: (!) 56 72  Resp: 18 12  Temp: 98.9 F (37.2 C) 98.6 F (37 C)  SpO2: 100% 100%    8/17  Increased metoprolol to 61m bid, improved with HR still at 72bpm  12. Substance abuse: Counsel 13. Constipation: still no bm, feels bloated today  -continue senna-s   -dulcolax suppository pr  - sorbitol and SSE reulted in 2  lg BM    14.  Left foot drop, per pt AFO feels heavy will discuss with PT  LOS: 14 days A FACE TO FParrott8/19/2020, 8:05 AM

## 2018-08-31 NOTE — Progress Notes (Signed)
Occupational Therapy Session Note  Patient Details  Name: Todd Hood MRN: [id number] Date of Birth: September 05, 1975  Today's Date: 08/31/2018 OT Individual Time: 1133-1200 OT Individual Time Calculation (min): 27 min    Short Term Goals: Week 2:  OT Short Term Goal 1 (Week 2): Pt will demonstrate L UE HEP with paper handout and min cuing for technique. OT Short Term Goal 2 (Week 2): Pt will perform LB dressing with mod A. OT Short Term Goal 3 (Week 2): Pt will perform toileting with mod A.  Skilled Therapeutic Interventions/Progress Updates:    Pt sitting in the wheelchair to start the session.  He had removed his shoes and the left AFO secondary to report of decreased pain.  Worked on LUE activation and neuromuscular re-education.  He demonstrates increased tightness in the internal rotators as well as elbow flexors.  Focused on activation of the external rotators and elbow extensors.  RUE was placed on handle of bedside table and pt was instructed to push the table forward with mod assist.  Transitioned to using a flat surface of his notebook and a washcloth to work on activation of external rotation as well as elbow extension and external rotation.  Trace activation noted in external rotation with notebook positioned so that gravity was helping him to complete small AROM.  Mod instructional cueing needed for him to maintain upright sitting posture when attempting LUE movement as he would sit mostly in posterior pelvic tilt.  Finished session with pt's father present and call button and phone in reach with safety belt in place.    Therapy Documentation Precautions:  Precautions Precautions: Fall Precaution Comments: R eye ptosis  Restrictions Weight Bearing Restrictions: No  Pain: Pain Assessment Pain Scale: 0-10 Pain Score: 0-No pain  Therapy/Group: Individual Therapy  Osiah Haring OTR/L 08/31/2018, 12:12 PM

## 2018-08-31 NOTE — Progress Notes (Signed)
Physical Therapy Session Note  Patient Details  Name: Todd Hood MRN: [id number] Date of Birth: [birth year]  Today's Date: 08/31/2018 PT Individual Time: 8727-6184 PT Individual Time Calculation (min): 69 min   Short Term Goals: Week 2:  PT Short Term Goal 1 (Week 2): Pt will be able to gait x 30' with mod assist PT Short Term Goal 2 (Week 2): Pt will be able to perform 4 steps with rail with mod assist to prepare for home entry PT Short Term Goal 3 (Week 2): Pt will be able to perform sit <> stand with min assist  Skilled Therapeutic Interventions/Progress Updates: Pt presented in bed agreeable. Pt performed supine to sit with bed at 15 degrees and use of bed rail with supervision. Performed squat pivot transfer to R into w/c with CGA. Pt transported to day room to perform Lite Gait activity. Pt ambulated 69f with Lite gait with PTA providing manual facilitation for pelvic rotation, placement of LLE due to increased adduction, multimodal cues for correction of L lean, wt shift to L and tactile cues for engaging quad. After gait pt maintained in standing with L knee blocked and max multimodal cues for maintaining neutral, pt was able to maintain with minA for approx 1 min. Pt then participated in block practice squat pivot transfers both L/R with minA fading to CGA. Pt returned to room propelling w/c supervision via hemi technique. Pt noted to have improved awareness this session and was able safety negotiate obstacles this session until turning into room at which point bumped into L door frame. Pt remained in w/c at end of session and left with call bell within reach, belt alarm on and needs met.      Therapy Documentation Precautions:  Precautions Precautions: Fall Precaution Comments: R eye ptosis  Restrictions Weight Bearing Restrictions: No General:   Vital Signs:    Therapy/Group: Individual Therapy  Emeri Estill  Brihany Butch, PTA  08/31/2018, 12:59 PM

## 2018-08-31 NOTE — Progress Notes (Signed)
Social Work Patient ID: Todd Hood, male   DOB: July 07, 1975, 43 y.o.   MRN: [id number]  Met with pt and his father who was here to discuss team conference progress toward his goals and discharge still 8/26. Will ned to begin education with all of his caregivers. Father voiced he can come in and pt will talk with his girlfriend to come in also. Will touch base with pt tomorrow to set up education.

## 2018-08-31 NOTE — Progress Notes (Signed)
Occupational Therapy Session Note  Patient Details  Name: Todd Hood MRN: [id number] Date of Birth: 1976-01-02  Today's Date: 08/31/2018 OT Individual Time: 0703-0800 OT Individual Time Calculation (min): 57 min    Short Term Goals: Week 2:  OT Short Term Goal 1 (Week 2): Pt will demonstrate L UE HEP with paper handout and min cuing for technique. OT Short Term Goal 2 (Week 2): Pt will perform LB dressing with mod A. OT Short Term Goal 3 (Week 2): Pt will perform toileting with mod A.  Skilled Therapeutic Interventions/Progress Updates:    Upon entering the room, pt in bed finishing breakfast with set up A to open containers. Pt performed supine >sit with min A to EOB. Mod A stand pivot transfer into wheelchair from bed. Pt propelled wheelchair to dresser to obtain needed clothing items. Pt engaged in bathing tasks at sink with hand over hand assistance to utilize L UE for functional tasks. Pt standing with mod A for balance while pt washes buttocks and peri area. Pt incontinent of urine while standing this session with no warning. OT assisted pt with clean up. Hemiplegic technique for dressing techniques at mod A overall and min cuing as well. One handed technique with LEs in figure four position to don B socks. Pt remained in wheelchair with chair alarm donned and call bell within reach.   Therapy Documentation Precautions:  Precautions Precautions: Fall Precaution Comments: R eye ptosis  Restrictions Weight Bearing Restrictions: No General:   Vital Signs: Therapy Vitals Pulse Rate: 72 BP: (!) 136/96 Pain: Pain Assessment Pain Scale: 0-10 Pain Score: 0-No pain    Therapy/Group: Individual Therapy  Gypsy Decant 08/31/2018, 10:06 AM

## 2018-09-01 ENCOUNTER — Inpatient Hospital Stay (HOSPITAL_COMMUNITY): Payer: Self-pay | Admitting: Occupational Therapy

## 2018-09-01 ENCOUNTER — Inpatient Hospital Stay (HOSPITAL_COMMUNITY): Payer: Self-pay | Admitting: Physical Therapy

## 2018-09-01 LAB — GLUCOSE, CAPILLARY
Glucose-Capillary: 105 mg/dL — ABNORMAL HIGH (ref 70–99)
Glucose-Capillary: 108 mg/dL — ABNORMAL HIGH (ref 70–99)

## 2018-09-01 NOTE — Progress Notes (Signed)
Occupational Therapy Weekly Progress Note  Patient Details  Name: Todd Hood MRN: [id number] Date of Birth: 1975/05/01  Beginning of progress report period: August 24, 2018 End of progress report period: September 01, 2018  Today's Date: 09/01/2018 OT Individual Time: 1324-4010 OT Individual Time Calculation (min): 60 min    Patient has met 3 of 3 short term goals.  Pt has made some progress this week to be able to transfer with squat pivots, rise to stand, hold static balance and complete toileting and LB dressing with mod A.  Due to his decreased midline awareness/ L lean, reaching a min A level is not realistic by his discharge date therefore LTGs have been downgraded.    Patient continues to demonstrate the following deficits: abnormal tone and decreased motor planning, decreased visual perceptual skills, decreased visual motor skills and hemianopsia, decreased midline orientation and decreased attention to left and decreased standing balance, decreased postural control, hemiplegia and decreased balance strategies and therefore will continue to benefit from skilled OT intervention to enhance overall performance with BADL.  Patient is making some good progress, but he is not progressing toward all of his long term goals.  See goal revision..  Plan of care revisions: See below:.   Problem: RH Balance Goal: LTG: Patient will maintain dynamic sitting balance (OT) Description: LTG:  Patient will maintain dynamic sitting balance with assistance during activities of daily living (OT) Flowsheets (Taken 09/01/2018 1247) LTG: Pt will maintain dynamic sitting balance during ADLs with: (downgraded 09/01/18) Contact Guard/Touching assist Goal: LTG Patient will maintain dynamic standing with ADLs (OT) Description: LTG:  Patient will maintain dynamic standing balance with assist during activities of daily living (OT)  Flowsheets (Taken 09/01/2018 1247) LTG: Pt will maintain dynamic standing balance  during ADLs with: (downgraded 09/01/18) Maximal Assistance - Patient 25 - 49%   Problem: RH Dressing Goal: LTG Patient will perform lower body dressing w/assist (OT) Description: LTG: Patient will perform lower body dressing with assist, with/without cues in positioning using equipment (OT) Flowsheets (Taken 09/01/2018 1247) LTG: Pt will perform lower body dressing with assistance level of: (downgraded 09/01/18) Moderate Assistance - Patient 50 - 74%   Problem: RH Toileting Goal: LTG Patient will perform toileting task (3/3 steps) with assistance level (OT) Description: LTG: Patient will perform toileting task (3/3 steps) with assistance level (OT)  Flowsheets (Taken 09/01/2018 1247) LTG: Pt will perform toileting task (3/3 steps) with assistance level: Moderate Assistance - Patient 50 - 74%   Problem: RH Functional Use of Upper Extremity Goal: LTG Patient will use RT/LT upper extremity as a (OT) Description: LTG: Patient will use right/left upper extremity as a stabilizer/gross assist/diminished/nondominant/dominant level with assist, with/without cues during functional activity (OT) Flowsheets (Taken 09/01/2018 1247) LTG: Use of upper extremity in functional activities: (downgraded 09/01/18) LUE as a stabilizer   Problem: RH Toilet Transfers Goal: LTG Patient will perform toilet transfers w/assist (OT) Description: LTG: Patient will perform toilet transfers with assist, with/without cues using equipment (OT) Flowsheets (Taken 09/01/2018 1247) LTG: Pt will perform toilet transfers with assistance level of: (downgraded 09/01/18) Moderate Assistance - Patient 50 - 74%     OT Short Term Goals Week 1:  OT Short Term Goal 1 (Week 1): PT will perform mod A standing balance during LB clothing management. OT Short Term Goal 1 - Progress (Week 1): Met OT Short Term Goal 2 (Week 1): Pt will perform UB dressing with mod A. OT Short Term Goal 2 - Progress (Week 1): Met OT Short  Term Goal 3 (Week 1): Pt  will perform toilet transfer with mod A. OT Short Term Goal 3 - Progress (Week 1): Met Week 2:  OT Short Term Goal 1 (Week 2): Pt will demonstrate L UE HEP with paper handout and min cuing for technique. OT Short Term Goal 1 - Progress (Week 2): Met OT Short Term Goal 2 (Week 2): Pt will perform LB dressing with mod A. OT Short Term Goal 2 - Progress (Week 2): Met OT Short Term Goal 3 (Week 2): Pt will perform toileting with mod A. OT Short Term Goal 3 - Progress (Week 2): Met Week 3:  OT Short Term Goal 1 (Week 3): STGs = LTGs  Skilled Therapeutic Interventions/Progress Updates:      Pt seen for BADL retraining of toileting, bathing, and dressing with a focus on midline awareness, balance, LUE functional use.  See ADL documentation below.  With sit to stand from toilet and from w/c had pt place R hand on bar.  He could rise to stand and stand statically with min A.  As soon as he removed R hand to A with clothing management he needed mod -max A for balance as he would lean to the left.  He has a muscular body type and he is tall so the slightest lean is very challenging to facilitate to midline.  In the shower he used lateral lean to wash his bottom and then needed A to cleanse more thoroughly.  Pt worked on [employer] and recalled 2 exercises without cues.  Cued for bimanual self ROM exercises.   Pt resting in wc with belt alarm on and all needs met.  Therapy Documentation Precautions:  Precautions Precautions: Fall Precaution Comments: R eye ptosis  Restrictions Weight Bearing Restrictions: No   Pain: Pain Assessment Pain Score: 0-No pain ADL: ADL Grooming: Setup Where Assessed-Grooming: Sitting at sink Upper Body Bathing: Minimal assistance Where Assessed-Upper Body Bathing: Shower Lower Body Bathing: Minimal assistance Where Assessed-Lower Body Bathing: Shower Upper Body Dressing: Minimal assistance Where Assessed-Upper Body Dressing: Wheelchair Lower Body Dressing: Moderate  assistance Where Assessed-Lower Body Dressing: Wheelchair Toileting: Moderate assistance Where Assessed-Toileting: Glass blower/designer: Moderate assistance Toilet Transfer Method: Squat pivot Toilet Transfer Equipment: Grab bars, Raised toilet seat Social research officer, government: Moderate assistance Social research officer, government Method: Education officer, environmental: Radio broadcast assistant, Grab bars   Therapy/Group: Individual Therapy  [place] 09/01/2018, 12:42 PM

## 2018-09-01 NOTE — Progress Notes (Signed)
[place] PHYSICAL MEDICINE & REHABILITATION PROGRESS NOTE   Subjective/Complaints:  No issues overnite   ROS- neg CP, SOB, N/V/D  Objective:   No results found. No results for input(s): WBC, HGB, HCT, PLT in the last 72 hours. No results for input(s): NA, K, CL, CO2, GLUCOSE, BUN, CREATININE, CALCIUM in the last 72 hours.  Intake/Output Summary (Last 24 hours) at 09/01/2018 0812 Last data filed at 09/01/2018 0140 Gross per 24 hour  Intake 240 ml  Output 500 ml  Net -260 ml     Physical Exam: Vital Signs Blood pressure (!) 147/98, pulse 69, temperature 98.5 F (36.9 C), temperature source Oral, resp. rate 16, height 5\' 9"  (1.753 m), weight 90.1 kg, SpO2 99 %.  Constitutional: No distress . Vital signs reviewed. HEENT: EOMI, oral membranes moist Neck: supple Cardiovascular: RRR without murmur. No JVD    Respiratory: CTA Bilaterally without wheezes or rales. Normal effort    GI: BS +, non-tender, non-distended  Musculoskeletal:     Comments: No edema or tenderness in extremities  Neurological: He is alert. Poor Left lid closure with reduced upward gaze  Motor: RUE/RLE: 5/5 proximal to distal LUE: Should abduction 1 to 1+/5, elbow flex/ext 2-/5, hand grip tr to 1/5.  LLE: HF, KE 3-/5   ADF/APF 0/5- ongoing spastic left hemiparesis--reduced in standing Sensation decreased to LT left Skin: Skin is warm and dry.  Psychiatric: pleasant   Assessment/Plan: 1. Functional deficits secondary to Right thalamic infarct which require 3+ hours per day of interdisciplinary therapy in a comprehensive inpatient rehab setting.  Physiatrist is providing close team supervision and 24 hour management of active medical problems listed below.  Physiatrist and rehab team continue to assess barriers to discharge/monitor patient progress toward functional and medical goals  Care Tool:  Bathing  Bathing activity did not occur: Refused Body parts bathed by patient: Chest, Abdomen, Front  perineal area, Right upper leg, Left upper leg, Face, Right lower leg, Left arm, Left lower leg   Body parts bathed by helper: Buttocks, Right arm     Bathing assist Assist Level: Minimal Assistance - Patient > 75%     Upper Body Dressing/Undressing Upper body dressing   What is the patient wearing?: Pull over shirt    Upper body assist Assist Level: Minimal Assistance - Patient > 75%    Lower Body Dressing/Undressing Lower body dressing    Lower body dressing activity did not occur: Refused What is the patient wearing?: Pants, Incontinence brief     Lower body assist Assist for lower body dressing: Moderate Assistance - Patient 50 - 74%     Toileting Toileting    Toileting assist Assist for toileting: Moderate Assistance - Patient 50 - 74%     Transfers Chair/bed transfer  Transfers assist  Chair/bed transfer activity did not occur: Safety/medical concerns  Chair/bed transfer assist level: Moderate Assistance - Patient 50 - 74%     Locomotion Ambulation   Ambulation assist      Assist level: 2 helpers Assistive device: Walker-rolling Max distance: 100   Walk 10 feet activity   Assist  Walk 10 feet activity did not occur: Safety/medical concerns  Assist level: 2 helpers Assistive device: Walker-rolling, Orthosis   Walk 50 feet activity   Assist Walk 50 feet with 2 turns activity did not occur: Safety/medical concerns  Assist level: 2 helpers      Walk 150 feet activity   Assist Walk 150 feet activity did not occur: Safety/medical concerns  Walk 10 feet on uneven surface  activity   Assist Walk 10 feet on uneven surfaces activity did not occur: Safety/medical concerns         Wheelchair     Assist   Type of Wheelchair: Manual    Wheelchair assist level: Supervision/Verbal cueing Max wheelchair distance: 150    Wheelchair 50 feet with 2 turns activity    Assist        Assist Level: Supervision/Verbal  cueing   Wheelchair 150 feet activity     Assist     Assist Level: Supervision/Verbal cueing    Medical Problem List and Plan: 1. Left hemiparesisand left lateral lean secondary to  acute right thalamic, midbrain and internal capsule infarcts.                 -Continue CIR therapies including PT, OT, and SLP 2.  Antithrombotics: -DVT/anticoagulation:  Pharmaceutical: Lovenox             -antiplatelet therapy: ASA and Plavix x3 weeks followed by Plavix alone ~8/23 3. Pain Management: N/A 4. Mood: LCSW to follow for evaluation and support             -antipsychotic agents: N/A 5. Neuropsych: This patient is ?fully capable of making decisions on his own behalf. 6. Skin/Wound Care: Routine pressure relief measures 7. Fluids/Electrolytes/Nutrition: Monitor I/Os.                encourage fluids, po 8.  Prediabetes: Hemoglobin A1c is-6.2. Carb modified restrictions added to diet.                -AM cbg wnl at this point 9.  Hypokalemia:improved with  potassium supplementation , 3.9 on 8/17 10.  Dyslipidemia: On Lipitor 11.  HTN: Permissive hypertension,   -began metoprolol 12.5 mg bid 8/7--- some improvement today---avoid over treatment Vitals:   08/31/18 2122 09/01/18 0625  BP: 118/84 (!) 147/98  Pulse: 62 69  Resp:    Temp:  98.5 F (36.9 C)  SpO2:  99%    8/17  Increased metoprolol to 25mg  bid, improved with HR still at 72bpm  12. Substance abuse: Counsel 13. Constipation: still no bm, feels bloated today  -continue senna-s   -dulcolax suppository pr  - sorbitol and SSE reulted in 2 lg BM     LOS: 15 days A FACE TO [place] E Kirsteins 09/01/2018, 8:12 AM

## 2018-09-01 NOTE — Progress Notes (Signed)
Social Work Patient ID: Todd Hood, male   DOB: 1975/07/07, 43 y.o.   MRN: [id number]  Contacted Toshia-girlfriend to set up education for tomorrow @ 1:00 to begin training for discharge next Wed.

## 2018-09-01 NOTE — Plan of Care (Signed)
  Problem: RH Balance Goal: LTG: Patient will maintain dynamic sitting balance (OT) Description: LTG:  Patient will maintain dynamic sitting balance with assistance during activities of daily living (OT) Flowsheets (Taken 09/01/2018 1247) LTG: Pt will maintain dynamic sitting balance during ADLs with: (downgraded 09/01/18) Contact Guard/Touching assist Goal: LTG Patient will maintain dynamic standing with ADLs (OT) Description: LTG:  Patient will maintain dynamic standing balance with assist during activities of daily living (OT)  Flowsheets (Taken 09/01/2018 1247) LTG: Pt will maintain dynamic standing balance during ADLs with: (downgraded 09/01/18) Maximal Assistance - Patient 25 - 49%   Problem: RH Dressing Goal: LTG Patient will perform lower body dressing w/assist (OT) Description: LTG: Patient will perform lower body dressing with assist, with/without cues in positioning using equipment (OT) Flowsheets (Taken 09/01/2018 1247) LTG: Pt will perform lower body dressing with assistance level of: (downgraded 09/01/18) Moderate Assistance - Patient 50 - 74%   Problem: RH Toileting Goal: LTG Patient will perform toileting task (3/3 steps) with assistance level (OT) Description: LTG: Patient will perform toileting task (3/3 steps) with assistance level (OT)  Flowsheets (Taken 09/01/2018 1247) LTG: Pt will perform toileting task (3/3 steps) with assistance level: Moderate Assistance - Patient 50 - 74%   Problem: RH Functional Use of Upper Extremity Goal: LTG Patient will use RT/LT upper extremity as a (OT) Description: LTG: Patient will use right/left upper extremity as a stabilizer/gross assist/diminished/nondominant/dominant level with assist, with/without cues during functional activity (OT) Flowsheets (Taken 09/01/2018 1247) LTG: Use of upper extremity in functional activities: (downgraded 09/01/18) LUE as a stabilizer   Problem: RH Toilet Transfers Goal: LTG Patient will perform toilet  transfers w/assist (OT) Description: LTG: Patient will perform toilet transfers with assist, with/without cues using equipment (OT) Flowsheets (Taken 09/01/2018 1247) LTG: Pt will perform toilet transfers with assistance level of: (downgraded 09/01/18) Moderate Assistance - Patient 50 - 74%

## 2018-09-01 NOTE — Progress Notes (Signed)
Social Work Patient ID: Todd Hood, male   DOB: 1975-04-07, 43 y.o.   MRN: [id number]    Diagnosis codes: I63.9, I69.354 & I10  Height:  5'9              Weight:  198 lbs          Patient suffers from R-CVA   which impairs his ability to perform daily activities like ADL's and tolieting  in the home.  A walker  will not resolve issue with performing activities of daily living.  A wheelchair will allow patient to safely perform daily activities.  Patient is not able to propel themselves in the home using a standard weight wheelchair due to endurance and fatigue .  Patient can self propel in the lightweight wheelchair.

## 2018-09-01 NOTE — Progress Notes (Signed)
Physical Therapy Weekly Progress Note  Patient Details  Name: Todd Hood MRN: [id number] Date of Birth: Jun 10, 1975  Beginning of progress report period: August 25, 2018 End of progress report period: September 01, 2018  Today's Date: 09/01/2018 PT Individual Time: 0800-0915 AND [id number] PT Individual Time Calculation (min): 75 min and 60 min   Patient has met 2 of 2 short term goals. Pt making slow progress towards long term goals due to continued L side inattention, apraxia, and decreased awareness of deficits. Pt requires mod-max assist for ambulation with RW up to 101f and WC follow for safety. Mod-max assist to maintain standing balance with difficulty correcting or perceiving LOB . Pt has progressed to min assist squat pivot transfers and supervision assist WC mobility. Family education planned for 8/21 to finalize d/c plans.   Patient continues to demonstrate the following deficits muscle weakness and muscle joint tightness, decreased cardiorespiratoy endurance, impaired timing and sequencing, abnormal tone, unbalanced muscle activation, motor apraxia, ataxia and decreased coordination, decreased visual acuity, decreased visual perceptual skills and hemianopsia, decreased midline orientation, decreased attention to left, decreased motor planning and ideational apraxia, decreased initiation, decreased attention, decreased awareness, decreased problem solving, decreased safety awareness, decreased memory and delayed processing and decreased sitting balance, decreased standing balance, decreased postural control, hemiplegia and decreased balance strategies and therefore will continue to benefit from skilled PT intervention to increase functional independence with mobility.  Patient progressing toward long term goals..  Continue plan of care.  PT Short Term Goals Week 1:  PT Short Term Goal 1 (Week 1): Pt will perform bed mobility with min Assist PT Short Term Goal 1 - Progress (Week 1):  Met PT Short Term Goal 2 (Week 1): [facility] PT Short Term Goal 2 - Progress (Week 1): Met PT Short Term Goal 3 (Week 1): Pt will ambulate 365fwith mod assist and LRAD PT Short Term Goal 3 - Progress (Week 1): Progressing toward goal PT Short Term Goal 4 (Week 1): Pt will propell WC [id number] supervision A PT Short Term Goal 4 - Progress (Week 1): Progressing toward goal PT Short Term Goal 5 (Week 1): Pt will initiate stair training. PT Short Term Goal 5 - Progress (Week 1): Met Week 2:  PT Short Term Goal 1 (Week 2): Pt will be able to gait x 30' with mod assist PT Short Term Goal 2 (Week 2): Pt will be able to perform 4 steps with rail with mod assist to prepare for home entry PT Short Term Goal 3 (Week 2): Pt will be able to perform sit <> stand with min assist  Skilled Therapeutic Interventions/Progress Updates:   Session 1.   Pt received supine in bed and agreeable to PT. Supine>sit transfer with supervision assist and moderate cues for safety, and improved use of LUE to push in to sitting through elbow. Sitting balance EOB with min-supervision assist for pt to perform lower and upper body dressing. Mod- cues fo4r posture, R weight  Shift, and sequencing to prevent LOB L or posteriorly. Stand pivot [facility] and moderate cues for gait pattern and AD management.   Pt performed WC mobility to rehab gym with supervision assist using hemi technique, only min cues for attention to the L and doorway management.   Gait training to force use of LUE and LLE, RW and LAFO 2 x [id number] mod assist overall and max cues for gait pattern, posture, and  attention/activation of the LLE. Tall kneeling with forced WB through LUE to perform lateral, forward, and superior reaching with the RUE. qped with trunk supported on bench: push up with BLE, donkey kick with min assist and LUE placement and improved trunk control.  Squat pivot transfers x 8  throughout treatment to and from various surfaces with min assist to the R and L, mod cues for transfers to the L to improve set up and min cues to the R. Nustep reciprocal movement training x 5 min BLE only, level 1>2, mod assist for hip control on the L, and improved reciprocal movement pattern and symetry.   Patient returned to room and left sitting in [facility] with call bell in reach and all needs met.    Session 2.   Pt received supine in bed and agreeable to PT. Supine>sit transfer with min assist and cues for midline once sitting EOB. Sitting balance with supervision -min assist EOB for pt to complete eating lunch. Min cues for improved LOB positioning to improve safety and and increase BOS. Squat pivot transfers to and from [facility] throughout treatment with min assist to the R and L. Min cues for set up of each transfer and decreased standing height to improve safety and improve pivot. Car transfer with mod assist into car using squat pivot technique and min assist to exit car. Gait training with RW x 58f with mod assist and + 2 for WC follow. Max multimodal cues for attention to task, midline orientation improved activation of the LLE and improved step width, very poor cary over from one instructions to the next. Standing balacne to perform R and L reach with the RUE followed by bean bag toss to target x 4 bil. Min-mod assist to prevent L lateral LOB with moderate cues for awareness of LOB. [facility] and all needs met.         Therapy Documentation Precautions:  Precautions Precautions: Fall Precaution Comments: R eye ptosis  Restrictions Weight Bearing Restrictions: No Vital Signs: Therapy Vitals Temp: 98.5 F (36.9 C) Temp Source: Oral Pulse Rate: 69 BP: (!) 147/98 Patient Position (if appropriate): Lying Oxygen Therapy SpO2: 99 % O2 Device: Room Air Pain: denies  Therapy/Group: Individual Therapy  ALorie Phenix8/20/2020, 9:14 AM

## 2018-09-01 NOTE — Plan of Care (Signed)
  Problem: RH BOWEL ELIMINATION Goal: RH STG MANAGE BOWEL WITH ASSISTANCE Description: STG Manage Bowel with min Assistance. Outcome: Progressing Goal: RH STG MANAGE BOWEL W/MEDICATION W/ASSISTANCE Description: STG Manage Bowel with Medication with  mod I Assistance. Outcome: Progressing   Problem: RH BLADDER ELIMINATION Goal: RH STG MANAGE BLADDER WITH ASSISTANCE Description: STG Manage Bladder With Min Assistance Outcome: Progressing Goal: RH STG MANAGE BLADDER WITH EQUIPMENT WITH ASSISTANCE Description: STG Manage Bladder With Equipment With  Min Assistance Outcome: Progressing   Problem: RH PAIN MANAGEMENT Goal: RH STG PAIN MANAGED AT OR BELOW PT'S PAIN GOAL Description: Pt will be free of pain Outcome: Progressing   Problem: RH KNOWLEDGE DEFICIT GENERAL Goal: RH STG INCREASE KNOWLEDGE OF SELF CARE AFTER HOSPITALIZATION Description: Pt will be able to verbalize signs of stroke and ways to reduce risk for stroke along with compliance of medications and diet with supervision assist prior to DC Outcome: Progressing   Problem: RH Vision Goal: RH LTG Vision (Specify) Outcome: Progressing   

## 2018-09-02 ENCOUNTER — Ambulatory Visit (HOSPITAL_COMMUNITY): Payer: Self-pay | Admitting: Physical Therapy

## 2018-09-02 ENCOUNTER — Encounter (HOSPITAL_COMMUNITY): Payer: Self-pay | Admitting: Occupational Therapy

## 2018-09-02 ENCOUNTER — Inpatient Hospital Stay (HOSPITAL_COMMUNITY): Payer: Self-pay | Admitting: Physical Therapy

## 2018-09-02 LAB — GLUCOSE, CAPILLARY: Glucose-Capillary: 98 mg/dL (ref 70–99)

## 2018-09-02 NOTE — Progress Notes (Signed)
Physical Therapy Session Note  Patient Details  Name: Todd Hood MRN: [id number] Date of Birth: 1975-08-20  Today's Date: 09/02/2018 PT Individual Time: 0920-1020 1445-1535 PT Individual Time Calculation (min): 60 min 60 min   Short Term Goals: Week 2:  PT Short Term Goal 1 (Week 2): Pt will be able to gait x 30' with mod assist PT Short Term Goal 2 (Week 2): Pt will be able to perform 4 steps with rail with mod assist to prepare for home entry PT Short Term Goal 3 (Week 2): Pt will be able to perform sit <> stand with min assist  Skilled Therapeutic Interventions/Progress Updates:  Session 1 Pt received supine in bed and agreeable to PT. Supine>sit transfer with supervision assist and min cues for attention to the LLE. Sitting balance EOB for pt to don pants and socks with close supervision assist and min cues to awareness of LOB. Able to correct all LOB without additional assist form PT.    Squat  pivot transfer to [name] with min assist to the R and L throughout treatment, moderate cues for set up when transferring to the L.   Gait training with RW x 15 with mod assist overall and max cues for gait pattern and sequencing. Additional gait training with RW and L foot up brace x 71f with mod assist and max cues for posture, and sequencing to prevent LOB to the L. Improve foot clearance with foot-up. L knee remained in flexed position, but not buckling on this day.   PT instructed pt in supine NMR for L core and LLE. SAQ, heel slides, pelvic rotation, bridges, rolling R with manual resistance, PNF diagonals D1 and D2 for the LLE. All completed with min assist and moderate cues for full ROM and proper speed.   PT instructed pt in WC mobility with R hemi technique and supervision assist to prevent hitting obstacles and people on the L. Patient returned to room and [facility] and all needs met.      Session 2.   Pt received sitting in WC and agreeable to PT.  Pt's SO and father present for family education. Pt performed WC mobility with min cues for pt and family on improved awareness of obstacles on the L. Blocked practice transfer training to and from mat table x 6 with min assist to the R and mod assist the L. Moderate cues for positioning, sequencing and proper gaurding technique to prevent L LOB. Gait training with RW and foot up brace 2 x 170fwith mod assist from PT.   Stair negotiation trainng x 5 steps with mod assist with ascent and max assist for descent. Max multimodal cues for pt and family on importance of trunk and L knee control to prevent L LOB. PT also educated pt's family on bump up stair technique [facility] cues for proper handling and posture to prevent LBP.  Education provided to family for gait training to happen with therapy only until cleared to walk with family by HHPT. Car transfer to SUV height with PT and mod assist with RW. PT and family instructed in car transfer to [place] with min assist and moderate cues for sequencing and safety. Patient returned to room and [facility] reach and all needs met.               Therapy Documentation Precautions:  Precautions Precautions: Fall Precaution Comments: R  eye ptosis  Restrictions Weight Bearing Restrictions: No Pain: denies  Therapy/Group: Individual Therapy  [name] 09/02/2018, 11:11 AM

## 2018-09-02 NOTE — Progress Notes (Signed)
Social Work Patient ID: Todd Hood, male   DOB: Dec 17, 1975, 43 y.o.   MRN: [id number]  Met with pt, Dad and girlfriend who are her for education. OT voiced they will need to return and both plan to be here Sunday at 9:15 am for OT therapy and again Tuesday from 1:00-3:00 to make sure comfortable with his care at discharge. All aware pt will need 24 hr physical care at discharge. Will see how PT session goes.

## 2018-09-02 NOTE — Plan of Care (Signed)
  Problem: RH BOWEL ELIMINATION Goal: RH STG MANAGE BOWEL WITH ASSISTANCE Description: STG Manage Bowel with min Assistance. 09/02/2018 1626 by Adria Devon, LPN Outcome: Progressing 09/02/2018 1547 by Adria Devon, LPN Outcome: Progressing Goal: RH STG MANAGE BOWEL W/MEDICATION W/ASSISTANCE Description: STG Manage Bowel with Medication with  mod I Assistance. 09/02/2018 1626 by Adria Devon, LPN Outcome: Progressing 09/02/2018 1547 by Adria Devon, LPN Outcome: Progressing   Problem: RH BLADDER ELIMINATION Goal: RH STG MANAGE BLADDER WITH ASSISTANCE Description: STG Manage Bladder With Min Assistance 09/02/2018 1626 by Adria Devon, LPN Outcome: Progressing 09/02/2018 1547 by Adria Devon, LPN Outcome: Progressing Goal: RH STG MANAGE BLADDER WITH EQUIPMENT WITH ASSISTANCE Description: STG Manage Bladder With Equipment With  Min Assistance 09/02/2018 1626 by Adria Devon, LPN Outcome: Progressing 09/02/2018 1547 by Adria Devon, LPN Outcome: Progressing   Problem: RH PAIN MANAGEMENT Goal: RH STG PAIN MANAGED AT OR BELOW PT'S PAIN GOAL Description: Pt will be free of pain 09/02/2018 1626 by Ellison Carwin A, LPN Outcome: Progressing 09/02/2018 1547 by Ellison Carwin A, LPN Outcome: Progressing   Problem: RH KNOWLEDGE DEFICIT GENERAL Goal: RH STG INCREASE KNOWLEDGE OF SELF CARE AFTER HOSPITALIZATION Description: Pt will be able to verbalize signs of stroke and ways to reduce risk for stroke along with compliance of medications and diet with supervision assist prior to DC 09/02/2018 1626 by Ellison Carwin A, LPN Outcome: Progressing 09/02/2018 1547 by Adria Devon, LPN Outcome: Progressing   Problem: RH Vision Goal: RH LTG Vision (Specify) 09/02/2018 1626 by Adria Devon, LPN Outcome: Progressing 09/02/2018 1547 by Adria Devon, LPN Outcome: Progressing

## 2018-09-02 NOTE — Progress Notes (Signed)
Todd Hood PHYSICAL MEDICINE & REHABILITATION PROGRESS NOTE   Subjective/Complaints:  No c/o this am No problems in therapy   ROS- neg CP, SOB, N/V/D  Objective:   No results found. No results for input(s): WBC, HGB, HCT, PLT in the last 72 hours. No results for input(s): NA, K, CL, CO2, GLUCOSE, BUN, CREATININE, CALCIUM in the last 72 hours.  Intake/Output Summary (Last 24 hours) at 09/02/2018 0755 Last data filed at 09/01/2018 2120 Gross per 24 hour  Intake 240 ml  Output 300 ml  Net -60 ml     Physical Exam: Vital Signs Blood pressure (!) 127/92, pulse 70, temperature 98 F (36.7 C), temperature source Oral, resp. rate 18, height 5\' 9"  (1.753 m), weight 90.1 kg, SpO2 100 %.  Constitutional: No distress . Vital signs reviewed. HEENT: EOMI, oral membranes moist Neck: supple Cardiovascular: RRR without murmur. No JVD    Respiratory: CTA Bilaterally without wheezes or rales. Normal effort    GI: BS +, non-tender, non-distended  Musculoskeletal:     Comments: No edema or tenderness in extremities  Neurological: He is alert. Poor Left lid closure with reduced upward gaze  Motor: RUE/RLE: 5/5 proximal to distal LUE: Should abduction 1 to 1+/5, elbow flex/ext 2-/5, hand grip tr to 1/5.  LLE: HF, KE 3-/5   ADF/APF 0/5- ongoing spastic left hemiparesis--reduced in standing Sensation decreased to LT left Skin: Skin is warm and dry.  Psychiatric: pleasant   Assessment/Plan: 1. Functional deficits secondary to Right thalamic infarct which require 3+ hours per day of interdisciplinary therapy in a comprehensive inpatient rehab setting.  Physiatrist is providing close team supervision and 24 hour management of active medical problems listed below.  Physiatrist and rehab team continue to assess barriers to discharge/monitor patient progress toward functional and medical goals  Care Tool:  Bathing  Bathing activity did not occur: Refused Body parts bathed by patient: Chest,  Abdomen, Front perineal area, Right upper leg, Left upper leg, Face, Right lower leg, Left arm, Left lower leg   Body parts bathed by helper: Buttocks, Right arm     Bathing assist Assist Level: Minimal Assistance - Patient > 75%     Upper Body Dressing/Undressing Upper body dressing   What is the patient wearing?: Pull over shirt    Upper body assist Assist Level: Minimal Assistance - Patient > 75%    Lower Body Dressing/Undressing Lower body dressing    Lower body dressing activity did not occur: Refused What is the patient wearing?: Pants, Incontinence brief     Lower body assist Assist for lower body dressing: Moderate Assistance - Patient 50 - 74%     Toileting Toileting    Toileting assist Assist for toileting: Moderate Assistance - Patient 50 - 74%     Transfers Chair/bed transfer  Transfers assist  Chair/bed transfer activity did not occur: Safety/medical concerns  Chair/bed transfer assist level: Minimal Assistance - Patient > 75%     Locomotion Ambulation   Ambulation assist      Assist level: Maximal Assistance - Patient 25 - 49% Assistive device: Walker-rolling Max distance: 15   Walk 10 feet activity   Assist  Walk 10 feet activity did not occur: Safety/medical concerns  Assist level: Maximal Assistance - Patient 25 - 49% Assistive device: Walker-rolling   Walk 50 feet activity   Assist Walk 50 feet with 2 turns activity did not occur: Safety/medical concerns  Assist level: 2 helpers      Walk 150 feet activity  Assist Walk 150 feet activity did not occur: Safety/medical concerns         Walk 10 feet on uneven surface  activity   Assist Walk 10 feet on uneven surfaces activity did not occur: Safety/medical concerns         Wheelchair     Assist   Type of Wheelchair: Manual    Wheelchair assist level: Supervision/Verbal cueing Max wheelchair distance: 150    Wheelchair 50 feet with 2 turns  activity    Assist        Assist Level: Supervision/Verbal cueing   Wheelchair 150 feet activity     Assist     Assist Level: Supervision/Verbal cueing    Medical Problem List and Plan: 1. Left hemiparesisand left lateral lean secondary to  acute right thalamic, midbrain and internal capsule infarcts.                 -Continue CIR therapies including PT, OT, and SLP 2.  Antithrombotics: -DVT/anticoagulation:  Pharmaceutical: Lovenox             -antiplatelet therapy: ASA and Plavix x3 weeks followed by Plavix alone ~8/23 3. Pain Management: N/A 4. Mood: LCSW to follow for evaluation and support             -antipsychotic agents: N/A 5. Neuropsych: This patient is ?fully capable of making decisions on his own behalf. 6. Skin/Wound Care: Routine pressure relief measures 7. Fluids/Electrolytes/Nutrition: Monitor I/Os.                encourage fluids, po 8.  Prediabetes: Hemoglobin A1c is-6.2. Carb modified restrictions added to diet.                -AM cbg wnl at this point 9.  Hypokalemia:improved with  potassium supplementation , 3.9 on 8/17 10.  Dyslipidemia: On Lipitor 11.  HTN: Permissive hypertension,   -began metoprolol 12.5 mg bid 8/7--- some improvement today---avoid over treatment Vitals:   09/01/18 1959 09/02/18 0432  BP: (!) 132/93 (!) 127/92  Pulse: 60 70  Resp: 16 18  Temp: 98.4 F (36.9 C) 98 F (36.7 C)  SpO2: 100% 100%    8/17  Increased metoprolol to 25mg  bid,controlled overall without brady 12. Substance abuse: Counsel 13. Constipation: still no bm, feels bloated today  -continue senna-s   -dulcolax suppository pr  - sorbitol and SSE reulted in 2 lg BM     LOS: 16 days A FACE TO FACE EVALUATION WAS PERFORMED  Todd Hood Todd Hood 09/02/2018, 7:55 AM

## 2018-09-02 NOTE — Plan of Care (Signed)
  Problem: RH BOWEL ELIMINATION Goal: RH STG MANAGE BOWEL WITH ASSISTANCE Description: STG Manage Bowel with min Assistance. Outcome: Progressing Goal: RH STG MANAGE BOWEL W/MEDICATION W/ASSISTANCE Description: STG Manage Bowel with Medication with  mod I Assistance. Outcome: Progressing   Problem: RH BLADDER ELIMINATION Goal: RH STG MANAGE BLADDER WITH ASSISTANCE Description: STG Manage Bladder With Min Assistance Outcome: Progressing Goal: RH STG MANAGE BLADDER WITH EQUIPMENT WITH ASSISTANCE Description: STG Manage Bladder With Equipment With  Min Assistance Outcome: Progressing   Problem: RH PAIN MANAGEMENT Goal: RH STG PAIN MANAGED AT OR BELOW PT'S PAIN GOAL Description: Pt will be free of pain Outcome: Progressing   Problem: RH KNOWLEDGE DEFICIT GENERAL Goal: RH STG INCREASE KNOWLEDGE OF SELF CARE AFTER HOSPITALIZATION Description: Pt will be able to verbalize signs of stroke and ways to reduce risk for stroke along with compliance of medications and diet with supervision assist prior to DC Outcome: Progressing   Problem: RH Vision Goal: RH LTG Vision (Specify) Outcome: Progressing   

## 2018-09-02 NOTE — Progress Notes (Addendum)
Occupational Therapy Session Note  Patient Details  Name: Todd Hood MRN: [id number] Date of Birth: 11-09-1975  Today's Date: 09/02/2018 OT Individual Time: 1317-1430 OT Individual Time Calculation (min): 73 min   Skilled Therapeutic Interventions/Progress Updates:    Pt greeted in w/c with no c/o pain. Father and significant other (Todd Hood) present for family education. Family stated they are dividing 24/7 supervision. Session focus was placed on family education in preparation for d/c home. Pt wanted to shower, so Todd Hood and father had hands on practice with self care tasks with demonstration and verbal instruction. Min-Mod A from OT for squat pivot<TTB towards Rt side. Todd Hood provided cuing during bathing, with pt able to obtain figure 4 to wash his feet. Min A for sit<stand with Todd Hood providing steadying A and supporting Lt knee while he completed perihygiene. Educated Todd Hood to utilize lateral leans at home to maximize his safety. She stated that she is a CNA and comfortable with assisting him in standing. Todd Hood was setup for squat pivot<w/c, however pt stood with her and a 2nd person was needed for him to transfer towards Lt side. During dressing tasks, Todd Hood did well with cuing pt to maximize his functional independence using hemi strategies. Vcs for Todd Hood to stabilize Lt knee during dynamic standing activity. Pt was able to don his gripper socks with supervision assist. OT educated significant other and father on [facility] facilitation during all self care tasks. Demonstration provided while pt completed oral care w/c level. Also discussed visual scanning opportunities to promote head turns/Lt attention. At end of session, reviewed drop arm BSC transfers. OT provided demonstrations and verbal cuing. Pt able to transfer Lt<Rt with Min-Mod A via squat pivot. Family had increased difficultly, requiring intervention from OT several times to prevent fall when transferring towards Lt side. Discussed with  SW need for more family education so they can focus on safely transferring him. Pt remained in w/c at end of session with safety belt fastened and family still present.   Family reports they have a shower chair for him in their walk-in shower. Discussed the need for TTB if we recommend showering, depending on if he can safely complete shower transfers with family.    Therapy Documentation Precautions:  Precautions Precautions: Fall Precaution Comments: R eye ptosis  Restrictions Weight Bearing Restrictions: No Vital Signs: Therapy Vitals Temp: 98.6 F (37 C) Pulse Rate: 68 Resp: 19 BP: 139/89 Patient Position (if appropriate): Lying Oxygen Therapy SpO2: 100 % O2 Device: Room Air ADL: ADL Grooming: Setup Where Assessed-Grooming: Sitting at sink Upper Body Bathing: Minimal assistance Where Assessed-Upper Body Bathing: Shower Lower Body Bathing: Minimal assistance Where Assessed-Lower Body Bathing: Shower Upper Body Dressing: Minimal assistance Where Assessed-Upper Body Dressing: Wheelchair Lower Body Dressing: Moderate assistance Where Assessed-Lower Body Dressing: Wheelchair Toileting: Moderate assistance Where Assessed-Toileting: Glass blower/designer: Moderate assistance Toilet Transfer Method: Squat pivot Toilet Transfer Equipment: Grab bars, Raised toilet seat Social research officer, government: Moderate assistance Social research officer, government Method: Education officer, environmental: Radio broadcast assistant, Grab bars      Therapy/Group: Individual Therapy  Todd Hood A Todd Hood 09/02/2018, 4:25 PM

## 2018-09-03 DIAGNOSIS — I10 Essential (primary) hypertension: Secondary | ICD-10-CM

## 2018-09-03 DIAGNOSIS — I639 Cerebral infarction, unspecified: Secondary | ICD-10-CM

## 2018-09-03 DIAGNOSIS — E785 Hyperlipidemia, unspecified: Secondary | ICD-10-CM

## 2018-09-03 DIAGNOSIS — R7303 Prediabetes: Secondary | ICD-10-CM

## 2018-09-03 LAB — GLUCOSE, CAPILLARY: Glucose-Capillary: 97 mg/dL (ref 70–99)

## 2018-09-03 NOTE — Progress Notes (Signed)
Todd Hood is a 43 y.o. male [birth year] [id number]  Subjective: Patient awake but minimal verbal interaction; reports feeling well without new concerns.  Objective: Vital signs in last 24 hours: Temp:  [98.2 F (36.8 C)-98.6 F (37 C)] 98.2 F (36.8 C) (08/22 0352) Pulse Rate:  [63-69] 65 (08/22 0352) Resp:  [16-19] 16 (08/22 0352) BP: (136-147)/(87-113) 136/87 (08/22 0352) SpO2:  [99 %-100 %] 100 % (08/22 0352) Weight change:  Last BM Date: 09/02/18  Intake/Output from previous day: 08/21 0701 - 08/22 0700 In: 840 [P.O.:840] Out: 1050 [Urine:1050]  Physical Exam General: No apparent distress   lying right lateral supine looking out window Lungs: Normal effort. Lungs clear to auscultation, no crackles or wheezes. Cardiovascular: Regular rate and rhythm, no edema Neurological: No new neurological deficits   Lab Results: BMET    Component Value Date/Time   NA 141 08/29/2018 0744   K 3.9 08/29/2018 0744   CL 108 08/29/2018 0744   CO2 23 08/29/2018 0744   GLUCOSE 140 (H) 08/29/2018 0744   BUN 17 08/29/2018 0744   CREATININE 1.10 08/29/2018 0744   CALCIUM 9.4 08/29/2018 0744   GFRNONAA >60 08/29/2018 0744   GFRAA >60 08/29/2018 0744   CBC    Component Value Date/Time   WBC 8.3 08/29/2018 0744   RBC 5.04 08/29/2018 0744   HGB 15.6 08/29/2018 0744   HCT 44.0 08/29/2018 0744   PLT 278 08/29/2018 0744   MCV 87.3 08/29/2018 0744   MCH 31.0 08/29/2018 0744   MCHC 35.5 08/29/2018 0744   RDW 12.7 08/29/2018 0744   LYMPHSABS 2.3 08/18/2018 0554   MONOABS 0.8 08/18/2018 0554   EOSABS 0.1 08/18/2018 0554   BASOSABS 0.0 08/18/2018 0554   CBG's (last 3):   Recent Labs    09/01/18 1752 09/02/18 0632 09/03/18 0642  GLUCAP 108* 98 97   LFT's Lab Results  Component Value Date   ALT 21 08/18/2018   AST 14 (L) 08/18/2018   ALKPHOS 92 08/18/2018   BILITOT 1.2 08/18/2018    Studies/Results: No results found.  Medications:  I have reviewed the patient's  current medications. Scheduled Medications: . aspirin  300 mg Rectal Daily   Or  . aspirin  325 mg Oral Daily  . atorvastatin  80 mg Oral QHS  . clopidogrel  75 mg Oral Daily  . docusate sodium  100 mg Oral BID  . enoxaparin (LOVENOX) injection  40 mg Subcutaneous Q24H  . metoprolol tartrate  25 mg Oral BID  . multivitamin with minerals  1 tablet Oral Q breakfast  . polyethylene glycol  17 g Oral BID  . senna-docusate  2 tablet Oral QHS   PRN Medications: acetaminophen, alum & mag hydroxide-simeth, bisacodyl, cloNIDine, diphenhydrAMINE, guaiFENesin-dextromethorphan, polyethylene glycol, prochlorperazine **OR** prochlorperazine **OR** prochlorperazine, traZODone  Assessment/Plan: Principal Problem:   Right thalamic stroke (HCC) Active Problems:   HTN (hypertension)   Prediabetes   Dyslipidemia   Hemiparesis affecting left side as late effect of stroke (HCC)   Pseudobulbar affect   Length of stay, days: 17 1.  Acute right thalamic midbrain and internal capsule stroke with subsequent left hemiparesis and imbalance.  Continue aggressive inpatient rehab with PT, OT and speech therapy as ongoing.  Titrate medical management and supportive care as ongoing.  No change today from ongoing aspirin + Plavix to Plavix alone on August 23 per recommendations of neurology 2.  Hypertension with permissive lack of control at guidance of neurology.  Low-dose metoprolol with attention to avoid overtreatment.  Continue observation 3.  Hyperglycemia with A1c of 6.2.  Ongoing carb restricted diet with nutritional education 4.  Dyslipidemia.  On statin.   Alyana Kreiter A. Felicity CoyerLeschber, MD 09/03/2018, 11:06 AM

## 2018-09-04 ENCOUNTER — Inpatient Hospital Stay (HOSPITAL_COMMUNITY): Payer: Self-pay | Admitting: Occupational Therapy

## 2018-09-04 DIAGNOSIS — I69354 Hemiplegia and hemiparesis following cerebral infarction affecting left non-dominant side: Principal | ICD-10-CM

## 2018-09-04 LAB — GLUCOSE, CAPILLARY: Glucose-Capillary: 103 mg/dL — ABNORMAL HIGH (ref 70–99)

## 2018-09-04 NOTE — Progress Notes (Signed)
Todd QuanJoel Hood is a 43 y.o. male 10/01/1975 [id number]  Subjective: Patient awake but seemingly sedate; denies problems. Talking on phone  Objective: Vital signs in last 24 hours: Temp:  [98.4 F (36.9 C)-99.5 F (37.5 C)] 98.5 F (36.9 C) (08/23 0506) Pulse Rate:  [66-71] 71 (08/23 0506) Resp:  [14-18] 14 (08/23 0506) BP: (133-151)/(94-105) 133/94 (08/23 0506) SpO2:  [100 %] 100 % (08/23 0506) Weight change:  Last BM Date: 09/02/18  Intake/Output from previous day: 08/22 0701 - 08/23 0700 In: 480 [P.O.:480] Out: 750 [Urine:750]  Physical Exam General: No apparent distress   Lying in bed Lungs: Normal effort. Lungs clear to auscultation, no crackles or wheezes. Cardiovascular: Regular rate and rhythm, no edema Neurological: No new neurological deficits   Lab Results: BMET    Component Value Date/Time   NA 141 08/29/2018 0744   K 3.9 08/29/2018 0744   CL 108 08/29/2018 0744   CO2 23 08/29/2018 0744   GLUCOSE 140 (H) 08/29/2018 0744   BUN 17 08/29/2018 0744   CREATININE 1.10 08/29/2018 0744   CALCIUM 9.4 08/29/2018 0744   GFRNONAA >60 08/29/2018 0744   GFRAA >60 08/29/2018 0744   CBC    Component Value Date/Time   WBC 8.3 08/29/2018 0744   RBC 5.04 08/29/2018 0744   HGB 15.6 08/29/2018 0744   HCT 44.0 08/29/2018 0744   PLT 278 08/29/2018 0744   MCV 87.3 08/29/2018 0744   MCH 31.0 08/29/2018 0744   MCHC 35.5 08/29/2018 0744   RDW 12.7 08/29/2018 0744   LYMPHSABS 2.3 08/18/2018 0554   MONOABS 0.8 08/18/2018 0554   EOSABS 0.1 08/18/2018 0554   BASOSABS 0.0 08/18/2018 0554   CBG's (last 3):   Recent Labs    09/02/18 0632 09/03/18 0642 09/04/18 0622  GLUCAP 98 97 103*   LFT's Lab Results  Component Value Date   ALT 21 08/18/2018   AST 14 (L) 08/18/2018   ALKPHOS 92 08/18/2018   BILITOT 1.2 08/18/2018    Studies/Results: No results found.  Medications:  I have reviewed the patient's current medications. Scheduled Medications: . aspirin  300  mg Rectal Daily   Or  . aspirin  325 mg Oral Daily  . atorvastatin  80 mg Oral QHS  . clopidogrel  75 mg Oral Daily  . docusate sodium  100 mg Oral BID  . enoxaparin (LOVENOX) injection  40 mg Subcutaneous Q24H  . metoprolol tartrate  25 mg Oral BID  . multivitamin with minerals  1 tablet Oral Q breakfast  . polyethylene glycol  17 g Oral BID  . senna-docusate  2 tablet Oral QHS   PRN Medications: acetaminophen, alum & mag hydroxide-simeth, bisacodyl, cloNIDine, diphenhydrAMINE, guaiFENesin-dextromethorphan, polyethylene glycol, prochlorperazine **OR** prochlorperazine **OR** prochlorperazine, traZODone  Assessment/Plan: Principal Problem:   Right thalamic stroke (HCC) Active Problems:   HTN (hypertension)   Prediabetes   Dyslipidemia   Hemiparesis affecting left side as late effect of stroke (HCC)   Pseudobulbar affect   Length of stay, days: 18   1.  Acute right thalamic midbrain and internal capsule stroke with subsequent left hemiparesis and imbalance.  Continue aggressive inpatient rehab with PT, OT and speech therapy as ongoing.  Titrate medical management and supportive care as ongoing.  No change today from ongoing aspirin + Plavix to Plavix alone on August 23 per recommendations of neurology 2.  Hypertension with permissive lack of control at guidance of neurology.  Low-dose metoprolol with attention to avoid overtreatment.  Continue observation 3.  Hyperglycemia with A1c of 6.2.  Ongoing carb restricted diet with nutritional education 4.  Dyslipidemia.  On statin.    A. Asa Lente, MD 09/04/2018, 10:00 AM

## 2018-09-04 NOTE — Progress Notes (Signed)
Occupational Therapy Session Note  Patient Details  Name: Todd Hood MRN: [id number] Date of Birth: 01-May-1975  Today's Date: 09/04/2018 OT Individual Time: 7622-6333 OT Individual Time Calculation (min): 75 min   Short Term Goals: Week 3:  OT Short Term Goal 1 (Week 3): STGs = LTGs  Skilled Therapeutic Interventions/Progress Updates:    Pt greeted in bed with no c/o pain. Agreeable to shower. Supine<sit completed with Min-Mod A with use of bedrails. Min-Mod A squat pivot<w/c<TTB. Bathing completed while sitting with Min A overall, lateral leans used for hygiene with Mod A when leaning towards Lt side. HOH for incorporating Lt arm functionally. Pt able to use seated figure 4 to wash both feet. Dressing completed sit<stand at the sink afterwards. Vcs for motor planning and clothing orientation while incorporating hemi techniques. Min A sit<stand and Min-Mod A for dynamic standing balance. Significant other, Toshia, provided balance assist at this time with vcs for correcting L LE placement. For remainder of session, we focused on drop arm BSC transfers<w/c and also BSC transfers<bed (bed elevated to height at home). Father and SO Toshia required max step by step cues for safe setup and execution of both squat pivot and slideboard transfers. Education emphasis placed on head-hips relationship, mgt of Lt side/Lt lean of trunk, slowing down(!), and stopping transfer if the setup becomes unsafe(!). They stated they will have 2 people assisting with [facility] transfers at all times at home. Educated Toshia to use cutout of BSC and anterior leans (with 1 person assist) while 2nd person assisted with hygiene in the back. Also discussed having 1 person stand with him (in front towards the Lt side) for clothing mgt, while 2nd helper lowered/elevated pants. Both father and SO had a tough time when transferring pt towards the Lt via slideboard and squat pivot. They are coming again on Tuesday for continued family  education. They would benefit from continued transfer practice for [facility], and shower if this is deemed safe. Pt left with family, sitting comfortably in w/c with safety belt fastened.    Therapy Documentation Precautions:  Precautions Precautions: Fall Precaution Comments: R eye ptosis  Restrictions Weight Bearing Restrictions: No ADL: ADL Grooming: Setup Where Assessed-Grooming: Sitting at sink Upper Body Bathing: Minimal assistance Where Assessed-Upper Body Bathing: Shower Lower Body Bathing: Minimal assistance Where Assessed-Lower Body Bathing: Shower Upper Body Dressing: Minimal assistance Where Assessed-Upper Body Dressing: Wheelchair Lower Body Dressing: Moderate assistance Where Assessed-Lower Body Dressing: Wheelchair Toileting: Moderate assistance Where Assessed-Toileting: Glass blower/designer: Moderate assistance Toilet Transfer Method: Squat pivot Toilet Transfer Equipment: Grab bars, Raised toilet seat Social research officer, government: Moderate assistance Social research officer, government Method: Education officer, environmental: Radio broadcast assistant, Grab bars :     Therapy/Group: Individual Therapy  Keiera Strathman A Melondy Blanchard 09/04/2018, 12:39 PM

## 2018-09-05 ENCOUNTER — Inpatient Hospital Stay (HOSPITAL_COMMUNITY): Payer: Self-pay | Admitting: Occupational Therapy

## 2018-09-05 ENCOUNTER — Inpatient Hospital Stay (HOSPITAL_COMMUNITY): Payer: Self-pay | Admitting: Physical Therapy

## 2018-09-05 LAB — BASIC METABOLIC PANEL
Anion gap: 8 (ref 5–15)
BUN: 19 mg/dL (ref 6–20)
CO2: 25 mmol/L (ref 22–32)
Calcium: 9.2 mg/dL (ref 8.9–10.3)
Chloride: 108 mmol/L (ref 98–111)
Creatinine, Ser: 1.32 mg/dL — ABNORMAL HIGH (ref 0.61–1.24)
GFR calc Af Amer: >60 mL/min (ref >60)
GFR calc non Af Amer: >60 mL/min (ref >60)
Glucose, Bld: 102 mg/dL — ABNORMAL HIGH (ref 70–99)
Potassium: 4.5 mmol/L (ref 3.5–5.1)
Sodium: 141 mmol/L (ref 135–145)

## 2018-09-05 LAB — CBC
HCT: 38.9 % — ABNORMAL LOW (ref 39.0–52.0)
Hemoglobin: 14.3 g/dL (ref 13.0–17.0)
MCH: 31.8 pg (ref 26.0–34.0)
MCHC: 36.8 g/dL — ABNORMAL HIGH (ref 30.0–36.0)
MCV: 86.4 fL (ref 80.0–100.0)
Platelets: 248 10*3/uL (ref 150–400)
RBC: 4.5 MIL/uL (ref 4.22–5.81)
RDW: 12.7 % (ref 11.5–15.5)
WBC: 5.8 10*3/uL (ref 4.0–10.5)
nRBC: 0 % (ref 0.0–0.2)

## 2018-09-05 LAB — GLUCOSE, CAPILLARY: Glucose-Capillary: 105 mg/dL — ABNORMAL HIGH (ref 70–99)

## 2018-09-05 MED ORDER — CLONIDINE HCL 0.1 MG/24HR TD PTWK
0.1000 mg | MEDICATED_PATCH | TRANSDERMAL | Status: DC
  Administered 2018-09-05: 0.1 mg via TRANSDERMAL
  Filled 2018-09-05: qty 1

## 2018-09-05 NOTE — Progress Notes (Signed)
Occupational Therapy Discharge Summary  Patient Details  Name: Todd Hood MRN: [id number] Date of Birth: 03/17/1975  Patient has met 10 of 11 long term goals due to improved activity tolerance, improved balance, postural control, ability to compensate for deficits, improved attention, improved awareness and improved coordination.  Patient to discharge at overall Mod Assist level.  Patient's family members have attended family education sessions to provide the necessary assistance at discharge.    Pt still needs Min A to don overhead shirt and therefore UB dressing goal was not met.   Recommendation:  Patient will benefit from ongoing skilled OT services in home health setting to continue to advance functional skills in the area of BADL.  Equipment: drop arm BSC + slideboard  Reasons for discharge: treatment goals met and discharge from hospital  Patient/family agrees with progress made and goals achieved: Yes  OT Discharge Precautions/Restrictions  Precautions Precautions: Fall Precaution Comments: Lt hemi, Lt inattention and field cut Vital Signs Therapy Vitals Temp: 99.1 F (37.3 C) Temp Source: Oral Pulse Rate: 76 Resp: 18 BP: (!) 140/103 Patient Position (if appropriate): Sitting Oxygen Therapy SpO2: 100 % O2 Device: Room Air ADL ADL Eating: Set up Where Assessed-Eating: Wheelchair Grooming: Setup Where Assessed-Grooming: Sitting at sink Upper Body Bathing: Minimal assistance Where Assessed-Upper Body Bathing: Shower Lower Body Bathing: Minimal assistance Where Assessed-Lower Body Bathing: Shower Upper Body Dressing: Minimal assistance Where Assessed-Upper Body Dressing: Wheelchair Lower Body Dressing: Moderate assistance Where Assessed-Lower Body Dressing: Sitting at sink, Standing at sink Toileting: Moderate assistance Where Assessed-Toileting: Bedside Commode Toilet Transfer: Moderate assistance Toilet Transfer Method: Squat pivot Toilet Transfer  Equipment: Bedside commode Walk-In Shower Transfer: Minimal assistance Walk-In Shower Transfer Method: Squat pivot Walk-In Shower Equipment: Transfer tub bench, Grab bars Vision Baseline Vision/History: (Hx visual impairments from previous CVA) Patient Visual Report: Peripheral vision impairment Visual Fields: Left visual field deficit Perception  Perception: Impaired Inattention/Neglect: Does not attend to left visual field Praxis Praxis: Impaired Praxis Impairment Details: Motor planning;Perseveration Cognition Arousal/Alertness: Awake/alert Orientation Level: Oriented X4 Sustained Attention: Appears intact Problem Solving: Impaired Behaviors: Impulsive Safety/Judgment: Impaired Sensation Coordination Gross Motor Movements are Fluid and Coordinated: No Fine Motor Movements are Fluid and Coordinated: No Coordination and Movement Description: L hemiplegia Finger Nose Finger Test: Unable to complete with L UE Motor  Motor Motor: Hemiplegia;Abnormal tone Motor - Skilled Clinical Observations: Lt hemi with flaccid UE Mobility    Min-Mod A squat pivot bathroom transfers Trunk/Postural Assessment  Postural Control Postural Control: Deficits on evaluation(impaired, Lt LOBs in sitting and standing)  Balance Balance Balance Assessed: Yes Dynamic Sitting Balance Dynamic Sitting - Balance Support: No upper extremity supported Dynamic Sitting - Level of Assistance: 5: Stand by assistance(washing feet in the shower) Dynamic Standing Balance Dynamic Standing - Balance Support: No upper extremity supported Dynamic Standing - Level of Assistance: 3: Mod assist Dynamic Standing - Balance Activities: Lateral lean/weight shifting;Forward lean/weight shifting Dynamic Standing - Comments: (LB dressing) Extremity/Trunk Assessment RUE Assessment RUE Assessment: Within Functional Limits LUE Assessment LUE Assessment: Exceptions to WFL General Strength Comments: Brunnstrom Stage 4 in arm  and hand   Michaela A Hoffman 09/05/2018, 4:11 PM 

## 2018-09-05 NOTE — Progress Notes (Signed)
[facility] PHYSICAL MEDICINE & REHABILITATION PROGRESS NOTE   No new complaints. Told me there was family ed this weekend  ROS: Limited due to cognitive/behavioral    Objective:   No results found. Recent Labs    09/05/18 0607  WBC 5.8  HGB 14.3  HCT 38.9*  PLT 248   Recent Labs    09/05/18 0607  NA 141  K 4.5  CL 108  CO2 25  GLUCOSE 102*  BUN 19  CREATININE 1.32*  CALCIUM 9.2    Intake/Output Summary (Last 24 hours) at 09/05/2018 0945 Last data filed at 09/05/2018 0757 Gross per 24 hour  Intake 720 ml  Output 800 ml  Net -80 ml     Physical Exam: Vital Signs Blood pressure (!) 138/102, pulse (!) 59, temperature (!) 97.5 F (36.4 C), resp. rate 19, height 5\' 9"  (1.753 m), weight 90.1 kg, SpO2 100 %.  Constitutional: No distress . Vital signs reviewed. HEENT: EOMI, oral membranes moist Neck: supple Cardiovascular: RRR without murmur. No JVD    Respiratory: CTA Bilaterally without wheezes or rales. Normal effort    GI: BS +, non-tender, non-distended  Musculoskeletal:     Comments: No edema or tenderness in extremities  Neurological: He is alert. Poor Left lid closure with reduced upward gaze. Left facial droop, decreased oral-motor control Motor: RUE/RLE: 5/5 proximal to distal LUE: Should abduction 1 to 1+/5, elbow flex/ext 2-/5, hand grip tr to 1/5.  LLE: HF, KE 3-/5   ADF/APF 0/5- ongoing spastic left--stable hemiparesis--reduced in standing Sensation decreased to LT left Skin: Skin is warm and dry.  Psychiatric: pleasant   Assessment/Plan: 1. Functional deficits secondary to Right thalamic infarct which require 3+ hours per day of interdisciplinary therapy in a comprehensive inpatient rehab setting.  Physiatrist is providing close team supervision and 24 hour management of active medical problems listed below.  Physiatrist and rehab team continue to assess barriers to discharge/monitor patient progress toward functional and medical goals  Care  Tool:  Bathing  Bathing activity did not occur: Refused Body parts bathed by patient: Chest, Abdomen, Front perineal area, Right upper leg, Left upper leg, Face, Right lower leg, Left arm, Left lower leg   Body parts bathed by helper: Buttocks, Right arm     Bathing assist Assist Level: Minimal Assistance - Patient > 75%     Upper Body Dressing/Undressing Upper body dressing   What is the patient wearing?: Pull over shirt    Upper body assist Assist Level: Minimal Assistance - Patient > 75%    Lower Body Dressing/Undressing Lower body dressing    Lower body dressing activity did not occur: Refused What is the patient wearing?: Pants, Incontinence brief     Lower body assist Assist for lower body dressing: Moderate Assistance - Patient 50 - 74%     Toileting Toileting    Toileting assist Assist for toileting: Moderate Assistance - Patient 50 - 74%     Transfers Chair/bed transfer  Transfers assist  Chair/bed transfer activity did not occur: Safety/medical concerns  Chair/bed transfer assist level: Minimal Assistance - Patient > 75%     Locomotion Ambulation   Ambulation assist      Assist level: Maximal Assistance - Patient 25 - 49% Assistive device: Walker-rolling Max distance: 15   Walk 10 feet activity   Assist  Walk 10 feet activity did not occur: Safety/medical concerns  Assist level: Maximal Assistance - Patient 25 - 49% Assistive device: Walker-rolling   Walk 50 feet  activity   Assist Walk 50 feet with 2 turns activity did not occur: Safety/medical concerns  Assist level: 2 helpers      Walk 150 feet activity   Assist Walk 150 feet activity did not occur: Safety/medical concerns         Walk 10 feet on uneven surface  activity   Assist Walk 10 feet on uneven surfaces activity did not occur: Safety/medical concerns         Wheelchair     Assist   Type of Wheelchair: Manual    Wheelchair assist level:  Supervision/Verbal cueing Max wheelchair distance: 150    Wheelchair 50 feet with 2 turns activity    Assist        Assist Level: Supervision/Verbal cueing   Wheelchair 150 feet activity     Assist     Assist Level: Supervision/Verbal cueing    Medical Problem List and Plan: 1. Left hemiparesisand left lateral lean secondary to  acute right thalamic, midbrain and internal capsule infarcts.    -ELOS 8/26             -Continue CIR therapies including PT, OT, and SLP 2.  Antithrombotics: -DVT/anticoagulation:  Pharmaceutical: Lovenox             -antiplatelet therapy: ASA and Plavix x3 weeks followed by Plavix alone ~8/23 3. Pain Management: N/A 4. Mood: LCSW to follow for evaluation and support             -antipsychotic agents: N/A 5. Neuropsych: This patient is ?fully capable of making decisions on his own behalf. 6. Skin/Wound Care: Routine pressure relief measures 7. Fluids/Electrolytes/Nutrition: Monitor I/Os.                encourage fluids, po 8.  Prediabetes: Hemoglobin A1c is-6.2. Carb modified restrictions added to diet.                -AM cbg wnl at this point 9.  Hypokalemia:improved with  potassium supplementation , 3.9 on 8/17 10.  Dyslipidemia: On Lipitor 11.  HTN: Permissive hypertension,   -began metoprolol 12.5 mg bid 8/7-  Vitals:   09/04/18 2018 09/05/18 0428  BP: (!) 133/94 (!) 138/102  Pulse: 67 (!) 59  Resp: 19 19  Temp: 98.4 F (36.9 C) (!) 97.5 F (36.4 C)  SpO2: 100% 100%    8/17  Increased metoprolol to 25mg  bid  -ongoing DBP elevation  -begin catapres patch 1tts 12. Substance abuse: Counsel 13. Constipation: still no bm, feels bloated today  -continue senna-s   -dulcolax suppository pr    LOS: 19 days A FACE TO FACE EVALUATION WAS PERFORMED  Meredith Staggers 09/05/2018, 9:45 AM

## 2018-09-05 NOTE — Progress Notes (Signed)
Physical Therapy Session Note  Patient Details  Name: Todd Hood MRN: [id number] Date of Birth: February 16, 1975  Today's Date: 09/05/2018 PT Individual Time: 0805-0900 and 1410-1440 PT Individual Time Calculation (min): 55 min and 30 min  Short Term Goals: Week 2:  PT Short Term Goal 1 (Week 2): Pt will be able to gait x 30' with mod assist PT Short Term Goal 2 (Week 2): Pt will be able to perform 4 steps with rail with mod assist to prepare for home entry PT Short Term Goal 3 (Week 2): Pt will be able to perform sit <> stand with min assist  Skilled Therapeutic Interventions/Progress Updates: Pt presented in bed agreeable to therapy. Pt stating a toothache with nsg arriving to provide am meds (including pain meds). Pt donned socks in bed with set up and pt using figure four position to prop foot on knee. Pt performed bed mobility with use of bed features with supervision and verbal cues. PTA donned shoes total A for time management. Discussed family ed previous days with pt voicing concerns of family/girlfriend providing too much assistance and "lifting pt", as he does not wish for family to do that. Also disucssed car transfer as pt wishes to do car transfer to [place]. Provided edu on safest vehicle for car transfer would be sedan (dad's) and that he can continue to practice car transfers with higher vehicle during [facility] as will be able to practice in vehicle and will have skilled hands present with pt verbalizing understanding.  Discussed being an advocate for self and providing clear communication to caregivers as how to perform transfers/overall mobility. Pt performed squat pivot transfer to L with CGA. Pt moved to sink and brushed teeth with set up. Pt propelled to rehab gym with supervision and increased time with pt requiring verbal cues for L negotiation when exiting room (avoiding doorframe). Once in rehab gym pt required minA for w/c management to apply breaks on L side and instructions on how  to release leg rest. Performed squat pivot to R with CGA and set up pt to perform STS at mat. As PTA walking to retreive mirror pt attempted to stand without assistance with LOB to L with Delsa Sale OT close by to guide pt back to mat. Adv pt a good example (learning opportunity) of what it is currently unsafe to attempt standing/standing transfers by self). Performed STS x 3 then pt stating need for urinary void. Performed squat pivot transfer to L with minA (almost CGA) and transported back to room. PTA provided assistance for urinal (+void). Pt remained in w/c at end of session and left with belt alarm on, call bell within reach and needs met.   Tx2: Pt presented in bed with father present agreeable to therapy. Pt denies pain. PTA suggested father perform squat pivot transfer OOB for practice with father agreeable. Father demonstrating appropriate hand placement with pt and PTA providing verbal cues to minimize assist to allow pt to perform as much as he can safely. Father verbalized understanding. Pt transferred to rehab gym and performed stand pivot transfer with RW and modA with verbal cues for increased quad activation in LLE with PTA noticing improved contraction with decreased hip extension compensation. PTA used this example as rationale behind why pt should not do any standing activities without a therapist present. Pt participated in standing balance reaching with cards to board with L bias and pt requiring mod verbal cues to decrease L lateral lean and increase wt bearing on LLE.  PTA then had father perform squat pivot transfer to L with pt demonstrating appropriate verbal cues and both pt and father stating feeling good about providing transfers. Pt transferred back to room and remained in w/c with father signed off to perform bed/wc transfer. Pt left with all current needs met.      Therapy Documentation Precautions:  Precautions Precautions: Fall Precaution Comments: Lt hemi, Lt inattention and  field cut Restrictions Weight Bearing Restrictions: No General:   Vital Signs: Therapy Vitals Temp: 99.1 F (37.3 C) Temp Source: Oral Pulse Rate: 76 Resp: 18 BP: (!) 140/103 Patient Position (if appropriate): Sitting Oxygen Therapy SpO2: 100 % O2 Device: Room Air  Therapy/Group: Individual Therapy  Tully Burgo  Malcome Ambrocio, PTA  09/05/2018, 4:06 PM

## 2018-09-05 NOTE — Progress Notes (Signed)
Occupational Therapy Session Note  Patient Details  Name: Todd Hood MRN: [id number] Date of Birth: 1975/02/09  Today's Date: 09/05/2018 OT Individual Time: 1003-1100 and 4132-4401 OT Individual Time Calculation (min): 57 min and 31 min  Short Term Goals: Week 3:  OT Short Term Goal 1 (Week 3): STGs = LTGs  Skilled Therapeutic Interventions/Progress Updates:    Pt greeted in w/c with no c/o pain. Stated that he tried to stand unassisted during an earlier therapy session and fell back onto the mat. Pt with increased insight into deficits, reporting that he will not attempt this at home due to fall risk. He wanted to shower. Squat pivot<TTB completed with Min A. Had pt direct care for DME setup prior. While sitting on TTB, pt required min vcs for sequencing during bathing tasks. Min-Mod A for standing balance with Lt knee supported while OT completed perihygiene in the back. HOH for integrating Lt also to wash Rt side. Afterwards, he completed dressing w/c level at sink sit<stand. Mod-max vcs for sequencing, Lt attention, and sustained attention to task. Questioning cues for recall of hemi techniques. Min-Mod A for standing balance with active Lt knee buckling. He was able to assist with elevating pants up on Rt side and obtain figure 4 position bilaterally to don his gripper socks. At the end of the session pt remained in his w/c with all needs within reach and safety belt fastened. Tx focus placed on functional transfers, sitting/standing balance, Lt NMR, and cognition.   2nd Session 1:1 tx (31 min) Pt greeted in w/c, requesting to return to bed. Min A for squat pivot<Rt with min vcs for safe setup of w/c. Supervision for returning to supine. While semi reclined, went over a self ROM HEP for home. Reviewed exercise techniques with pt, emphasizing gentle stretching in end ranges. Pt was very tight in elbow flexors and shoulder supinators. Educated pt to engage in exercises every day with family  assist. He was able to read the exercise instructions but needed A for carryover of proper form. Pt remained in bed at end of session with all needs within reach and bed alarm set.   Therapy Documentation Precautions:  Precautions Precautions: Fall Precaution Comments: R eye ptosis  Restrictions Weight Bearing Restrictions: No Vital Signs:  Pain:   ADL: ADL Grooming: Setup Where Assessed-Grooming: Sitting at sink Upper Body Bathing: Minimal assistance Where Assessed-Upper Body Bathing: Shower Lower Body Bathing: Minimal assistance Where Assessed-Lower Body Bathing: Shower Upper Body Dressing: Minimal assistance Where Assessed-Upper Body Dressing: Wheelchair Lower Body Dressing: Moderate assistance Where Assessed-Lower Body Dressing: Wheelchair Toileting: Moderate assistance Where Assessed-Toileting: Glass blower/designer: Moderate assistance Toilet Transfer Method: Squat pivot Toilet Transfer Equipment: Grab bars, Raised toilet seat Social research officer, government: Moderate assistance Social research officer, government Method: Education officer, environmental: Radio broadcast assistant, Grab bars     Therapy/Group: Individual Therapy  Skeet Simmer 09/05/2018, 12:37 PM

## 2018-09-05 NOTE — Progress Notes (Signed)
Social Work Patient ID: Todd Hood, male   DOB: 06/06/1975, 43 y.o.   MRN: [id number]  Pt reports education went well over the weekend and his girlfriend and cousin are coming in tomorrow for more. Discussed PCP and medication concern. He reports the MD he has gone to does not assist him with medications and he is not able to afford them. Discussed getting into [facility] once discharged. He was agreement with and appreciative. Will work on equipment and follow up therapy needs.

## 2018-09-06 ENCOUNTER — Encounter (HOSPITAL_COMMUNITY): Payer: Self-pay | Admitting: Occupational Therapy

## 2018-09-06 ENCOUNTER — Ambulatory Visit (HOSPITAL_COMMUNITY): Payer: Self-pay | Admitting: Physical Therapy

## 2018-09-06 DIAGNOSIS — R7989 Other specified abnormal findings of blood chemistry: Secondary | ICD-10-CM

## 2018-09-06 DIAGNOSIS — K5901 Slow transit constipation: Secondary | ICD-10-CM

## 2018-09-06 LAB — GLUCOSE, CAPILLARY
Glucose-Capillary: 103 mg/dL — ABNORMAL HIGH (ref 70–99)
Glucose-Capillary: 114 mg/dL — ABNORMAL HIGH (ref 70–99)

## 2018-09-06 NOTE — Progress Notes (Signed)
Physical Therapy Session Note  Patient Details  Name: Todd Hood MRN: [id number] Date of Birth: 06-25-1975  Today's Date: 09/06/2018 PT Individual Time: 1305-1400 PT Individual Time Calculation (min): 55 min   Short Term Goals: Week 2:  PT Short Term Goal 1 (Week 2): Pt will be able to gait x 30' with mod assist PT Short Term Goal 2 (Week 2): Pt will be able to perform 4 steps with rail with mod assist to prepare for home entry PT Short Term Goal 3 (Week 2): Pt will be able to perform sit <> stand with min assist  Skilled Therapeutic Interventions/Progress Updates: Pt presented in bed no family currently present to participate in family training. Pt denies pain throughout session. Discussed safety for home and advised DO NOT RECOMMEND ascending stairs or ambulating once at home unless with therapy. Pt stating he will not live at mom's home and will not stay downstairs. Transported to rehab gym for time management and participated in gait training x 73ft with RW and modA. Pt was able to advance LLE and activate L quad with verbal cues and manual facilitation. Pt then participated in ascending/descending stairs. Pt required modA to ascend x 1 and modA x 2 to descend backwards safely. Pt's girlfriend then arrived and advised of current situation. Both advised that stairs not recommended but if done must need x 2 people to descend. Pt then transported to rehab gym and performed car transfer with GF, pt required modA for car transfer. PTA discussed with pt allowing pt to perform more of transfer when moving to R rather then providing increased assistance and to stay on pt's L side to guard and to monitor LLE. Pt transported back to room at end of session and left with GF present awaiting next OT family ed session.      Therapy Documentation Precautions:  Precautions Precautions: Fall Precaution Comments: Lt hemi, Lt inattention and field cut Restrictions Weight Bearing Restrictions:  No   Therapy/Group: Individual Therapy  Genevieve Ritzel  Braxton Weisbecker, PTA  09/06/2018, 4:03 PM

## 2018-09-06 NOTE — Plan of Care (Signed)
Problem: RH BOWEL ELIMINATION Goal: RH STG MANAGE BOWEL WITH ASSISTANCE Description: STG Manage Bowel with min Assistance. Outcome: Completed/Met Goal: RH STG MANAGE BOWEL W/MEDICATION W/ASSISTANCE Description: STG Manage Bowel with Medication with  mod I Assistance. Outcome: Completed/Met   Problem: RH BLADDER ELIMINATION Goal: RH STG MANAGE BLADDER WITH ASSISTANCE Description: STG Manage Bladder With Min Assistance Outcome: Completed/Met Goal: RH STG MANAGE BLADDER WITH EQUIPMENT WITH ASSISTANCE Description: STG Manage Bladder With Equipment With  Min Assistance Outcome: Completed/Met   Problem: RH PAIN MANAGEMENT Goal: RH STG PAIN MANAGED AT OR BELOW PT'S PAIN GOAL Description: Pt will be free of pain Outcome: Completed/Met   Problem: RH KNOWLEDGE DEFICIT GENERAL Goal: RH STG INCREASE KNOWLEDGE OF SELF CARE AFTER HOSPITALIZATION Description: Pt will be able to verbalize signs of stroke and ways to reduce risk for stroke along with compliance of medications and diet with supervision assist prior to DC Outcome: Completed/Met   Problem: RH Balance Goal: LTG Patient will maintain dynamic sitting balance (PT) Description: LTG:  Patient will maintain dynamic sitting balance with assistance during mobility activities (PT) Outcome: Completed/Met Goal: LTG Patient will maintain dynamic standing balance (PT) Description: LTG:  Patient will maintain dynamic standing balance with assistance during mobility activities (PT) Outcome: Completed/Met   Problem: Sit to Stand Goal: LTG:  Patient will perform sit to stand with assistance level (PT) Description: LTG:  Patient will perform sit to stand with assistance level (PT) Outcome: Completed/Met   Problem: RH Vision Goal: RH LTG Vision (Specify) Outcome: Completed/Met   Problem: RH Bed Mobility Goal: LTG Patient will perform bed mobility with assist (PT) Description: LTG: Patient will perform bed mobility with assistance,  with/without cues (PT). Outcome: Completed/Met   Problem: RH Bed to Chair Transfers Goal: LTG Patient will perform bed/chair transfers w/assist (PT) Description: LTG: Patient will perform bed to chair transfers with assistance (PT). Outcome: Completed/Met   Problem: RH Car Transfers Goal: LTG Patient will perform car transfers with assist (PT) Description: LTG: Patient will perform car transfers with assistance (PT). Outcome: Completed/Met   Problem: RH Ambulation Goal: LTG Patient will ambulate in controlled environment (PT) Description: LTG: Patient will ambulate in a controlled environment, # of feet with assistance (PT). Outcome: Completed/Met Goal: LTG Patient will ambulate in home environment (PT) Description: LTG: Patient will ambulate in home environment, # of feet with assistance (PT). Outcome: Completed/Met   Problem: RH Wheelchair Mobility Goal: LTG Patient will propel w/c in controlled environment (PT) Description: LTG: Patient will propel wheelchair in controlled environment, # of feet with assist (PT) Outcome: Completed/Met Goal: LTG Patient will propel w/c in home environment (PT) Description: LTG: Patient will propel wheelchair in home environment, # of feet with assistance (PT). Outcome: Completed/Met   Problem: RH Stairs Goal: LTG Patient will ambulate up and down stairs w/assist (PT) Description: LTG: Patient will ambulate up and down # of stairs with assistance (PT) Outcome: Completed/Met   Problem: Food- and Nutrition-Related Knowledge Deficit (NB-1.1) Goal: Nutrition education Description: Formal process to instruct or train a patient/client in a skill or to impart knowledge to help patients/clients voluntarily manage or modify food choices and eating behavior to maintain or improve health. Outcome: Completed/Met   Problem: RH Balance Goal: LTG: Patient will maintain dynamic sitting balance (OT) Description: LTG:  Patient will maintain dynamic sitting  balance with assistance during activities of daily living (OT) Outcome: Completed/Met Goal: LTG Patient will maintain dynamic standing with ADLs (OT) Description: LTG:  Patient will maintain dynamic standing balance with  assist during activities of daily living (OT)  Outcome: Completed/Met   Problem: Sit to Stand Goal: LTG:  Patient will perform sit to stand in prep for activites of daily living with assistance level (OT) Description: LTG:  Patient will perform sit to stand in prep for activites of daily living with assistance level (OT) Outcome: Completed/Met   Problem: RH Grooming Goal: LTG Patient will perform grooming w/assist,cues/equip (OT) Description: LTG: Patient will perform grooming with assist, with/without cues using equipment (OT) Outcome: Completed/Met   Problem: RH Bathing Goal: LTG Patient will bathe all body parts with assist levels (OT) Description: LTG: Patient will bathe all body parts with assist levels (OT) Outcome: Completed/Met   Problem: RH Dressing Goal: LTG Patient will perform upper body dressing (OT) Description: LTG Patient will perform upper body dressing with assist, with/without cues (OT). Outcome: Completed/Met Goal: LTG Patient will perform lower body dressing w/assist (OT) Description: LTG: Patient will perform lower body dressing with assist, with/without cues in positioning using equipment (OT) Outcome: Completed/Met   Problem: RH Toileting Goal: LTG Patient will perform toileting task (3/3 steps) with assistance level (OT) Description: LTG: Patient will perform toileting task (3/3 steps) with assistance level (OT)  Outcome: Completed/Met   Problem: RH Functional Use of Upper Extremity Goal: LTG Patient will use RT/LT upper extremity as a (OT) Description: LTG: Patient will use right/left upper extremity as a stabilizer/gross assist/diminished/nondominant/dominant level with assist, with/without cues during functional activity (OT) Outcome:  Completed/Met   Problem: RH Toilet Transfers Goal: LTG Patient will perform toilet transfers w/assist (OT) Description: LTG: Patient will perform toilet transfers with assist, with/without cues using equipment (OT) Outcome: Completed/Met

## 2018-09-06 NOTE — Progress Notes (Addendum)
[place] PHYSICAL MEDICINE & REHABILITATION PROGRESS NOTE  Subjective: Patient seen lying in bed this morning.  States he slept well overnight.  ROS: Limited due to cognition, but appears to deny CP, shortness of breath, nausea, vomiting or diarrhea.   Objective:   No results found. Recent Labs    09/05/18 0607  WBC 5.8  HGB 14.3  HCT 38.9*  PLT 248   Recent Labs    09/05/18 0607  NA 141  K 4.5  CL 108  CO2 25  GLUCOSE 102*  BUN 19  CREATININE 1.32*  CALCIUM 9.2    Intake/Output Summary (Last 24 hours) at 09/06/2018 0919 Last data filed at 09/05/2018 2231 Gross per 24 hour  Intake 462 ml  Output 400 ml  Net 62 ml     Physical Exam: Vital Signs Blood pressure 130/89, pulse 66, temperature 98.4 F (36.9 C), resp. rate 17, height 5\' 9"  (1.753 m), weight 90.1 kg, SpO2 100 %. Constitutional: No distress . Vital signs reviewed. HENT: Normocephalic.  Atraumatic. Eyes: EOMI. No discharge. Cardiovascular: No JVD. Respiratory: Normal effort.  No stridor. GI: Non-distended. Skin: Warm and dry.  Intact. Musc: No edema.  No tenderness. Neurological: Alert Poor left lid closure with reduced upward gaze.  Left facial weakness Motor: RUE/RLE: 5/5 proximal to distal LUE: Should abduction 1+/5, elbow flex/ext 2-/5, hand grip 3/5.  LLE: HF, KE 3-/5, ADF/APF 1/5 Increase in tone noted Sensation decreased to LT left Psychiatric: pleasantn   Assessment/Plan: 1. Functional deficits secondary to Right thalamic infarct which require 3+ hours per day of interdisciplinary therapy in a comprehensive inpatient rehab setting.  Physiatrist is providing close team supervision and 24 hour management of active medical problems listed below.  Physiatrist and rehab team continue to assess barriers to discharge/monitor patient progress toward functional and medical goals  Care Tool:  Bathing  Bathing activity did not occur: Refused Body parts bathed by patient: Chest, Abdomen,  Front perineal area, Right upper leg, Left upper leg, Face, Right lower leg, Left arm, Left lower leg   Body parts bathed by helper: Buttocks, Right arm     Bathing assist Assist Level: Minimal Assistance - Patient > 75%     Upper Body Dressing/Undressing Upper body dressing   What is the patient wearing?: Pull over shirt    Upper body assist Assist Level: Minimal Assistance - Patient > 75%    Lower Body Dressing/Undressing Lower body dressing    Lower body dressing activity did not occur: Refused What is the patient wearing?: Pants, Incontinence brief     Lower body assist Assist for lower body dressing: Moderate Assistance - Patient 50 - 74%     Toileting Toileting    Toileting assist Assist for toileting: Moderate Assistance - Patient 50 - 74%     Transfers Chair/bed transfer  Transfers assist  Chair/bed transfer activity did not occur: Safety/medical concerns  Chair/bed transfer assist level: Minimal Assistance - Patient > 75%     Locomotion Ambulation   Ambulation assist      Assist level: Maximal Assistance - Patient 25 - 49% Assistive device: Walker-rolling Max distance: 15   Walk 10 feet activity   Assist  Walk 10 feet activity did not occur: Safety/medical concerns  Assist level: Maximal Assistance - Patient 25 - 49% Assistive device: Walker-rolling   Walk 50 feet activity   Assist Walk 50 feet with 2 turns activity did not occur: Safety/medical concerns  Assist level: 2 helpers  Walk 150 feet activity   Assist Walk 150 feet activity did not occur: Safety/medical concerns         Walk 10 feet on uneven surface  activity   Assist Walk 10 feet on uneven surfaces activity did not occur: Safety/medical concerns         Wheelchair     Assist   Type of Wheelchair: Manual    Wheelchair assist level: Supervision/Verbal cueing Max wheelchair distance: 150    Wheelchair 50 feet with 2 turns  activity    Assist        Assist Level: Supervision/Verbal cueing   Wheelchair 150 feet activity     Assist     Assist Level: Supervision/Verbal cueing    Medical Problem List and Plan: 1. Left hemiparesisand left lateral lean secondary to  acute right thalamic, midbrain and internal capsule infarcts.    Cont CIR 2.  Antithrombotics: -DVT/anticoagulation:  Pharmaceutical: Lovenox             -antiplatelet therapy: ASA and Plavix x3, transitioned to Plavix alone on 8/26 3. Pain Management: N/A 4. Mood: LCSW to follow for evaluation and support             -antipsychotic agents: N/A 5. Neuropsych: This patient is not fully capable of making decisions on his own behalf. 6. Skin/Wound Care: Routine pressure relief measures 7. Fluids/Electrolytes/Nutrition: Monitor I/Os.                encourage fluids, po 8.  Prediabetes: Hemoglobin A1c is-6.2. Carb modified restrictions added to diet.     Stable on 8/25 9.  Hypokalemia:   K+ 4.5 on 8/24 10.  Dyslipidemia: On Lipitor 11.  HTN:   Vitals:   09/05/18 2016 09/06/18 0306  BP: (!) 131/95 130/89  Pulse: 67 66  Resp: 18 17  Temp: 97.9 F (36.6 C) 98.4 F (36.9 C)  SpO2: 100%    8/17  Increased metoprolol to 25mg  bid  begin catapres patch 1tts  Relatively stable on 8/25 12. Substance abuse: Counsel 13. Constipation:   -continue senna-s   -dulcolax suppository pr  Improving on 8/25 14. Elevated Cr.  Cr. 1.32 on 8/24  Encourage fluids    LOS: 20 days A FACE TO FACE EVALUATION WAS PERFORMED  Todd Hood Todd Hood Todd Hood 09/06/2018, 9:19 AM

## 2018-09-06 NOTE — Progress Notes (Signed)
Physical Therapy Discharge Summary  Patient Details  Name: Renly Roots MRN: [id number] Date of Birth: 1976/01/10  Today's Date: 09/06/2018    Patient has met 8 of 10 long term goals due to improved activity tolerance, improved balance, improved postural control, increased strength, improved attention, improved awareness and improved coordination.  Patient to discharge at a wheelchair level [place].   Patient's care partner is independent to provide the necessary physical assistance at discharge.  Reasons goals not met: Pt continues to have decreased L inattention and decreased LLE strength particularly quads/hamstrings functionally. Pt continues to require modA for ambulation. Therefore pt has been instructed to not participate in ambulation without therapist present.  Pt was also able to perform car transfer from [place] however required modA for SUV height. Pt encouraged to transfer home in sedan and perform SUV transfers with [facility] however pt not fully receptive. Family ed done with Serina Cowper and father regarding transfers and able to provide appropriate assist as needed.   Recommendation:  Patient will benefit from ongoing skilled PT services in home health setting to continue to advance safe functional mobility, address ongoing impairments in balance, transfers, attention, gait, safety awareness, and minimize fall risk.  Equipment: 18x18 wheelchair  Reasons for discharge: treatment goals met  Patient/family agrees with progress made and goals achieved: Yes  PT Discharge Precautions/Restrictions   Vital Signs Therapy Vitals Temp: 98.2 F (36.8 C) Pulse Rate: 68 Resp: 19 BP: (!) 133/99 Patient Position (if appropriate): Sitting Oxygen Therapy SpO2: 99 % O2 Device: Room Air Pain Pain Assessment Pain Scale: 0-10 Pain Score: 0-No pain Vision/Perception    severe field cut on the L.  Cognition Arousal/Alertness: Awake/alert Orientation Level: Oriented  X4 Sensation Coordination Gross Motor Movements are Fluid and Coordinated: No Fine Motor Movements are Fluid and Coordinated: No Coordination and Movement Description: L hemiplegia Motor  Motor Motor: Hemiplegia;Abnormal tone Motor - Discharge Observations: L hemi present  Mobility Bed Mobility Bed Mobility: Rolling Right;Rolling Left;Sit to Supine;Supine to Sit Rolling Right: Supervision/verbal cueing Rolling Left: Supervision/Verbal cueing Supine to Sit: Supervision/Verbal cueing Sit to Supine: Supervision/Verbal cueing Transfers Transfers: Stand to Sit;Sit to [employer] Transfers Sit to Stand: Contact Guard/Touching assist Stand to Sit: Contact Guard/Touching assist Squat Pivot Transfers: Contact Guard/Touching assist Locomotion  Gait Ambulation: Yes Gait Assistance: Moderate Assistance - Patient 50-74% Gait Distance (Feet): 10 Feet Assistive device: Rolling walker Gait Assistance Details: Tactile cues for sequencing;Tactile cues for weight shifting;Tactile cues for posture;Verbal cues for sequencing;Verbal cues for technique;Manual facilitation for weight bearing Gait Gait: Yes Gait Pattern: Impaired Gait Pattern: Left foot flat;Left flexed knee in stance;Trunk flexed;Lateral trunk lean to left Stairs / Additional Locomotion Stairs: Yes Stairs Assistance: Moderate Assistance - Patient 50 - 74% Stair Management Technique: One rail Right Number of Stairs: 4 Height of Stairs: 6 Wheelchair Mobility Wheelchair Mobility: Yes Wheelchair Assistance: Supervision/Verbal cueing Wheelchair Propulsion: Right upper extremity;Both upper extremities Wheelchair Parts Management: Supervision/cueing Distance: 122f  Trunk/Postural Assessment  Cervical Assessment Cervical Assessment: Exceptions to [facility] LLCThoracic Assessment Thoracic Assessment: Exceptions to WG[facility] HospitalLumbar Assessment Lumbar Assessment: Exceptions to WMaine Eye Care AssociatesPostural Control Postural Control: Deficits on  evaluation Protective Responses: delayed  Balance Balance Balance Assessed: Yes Static Sitting Balance Static Sitting - Balance Support: No upper extremity supported Static Sitting - Level of Assistance: 5: Stand by assistance Dynamic Sitting Balance Dynamic Sitting - Balance Support: No upper extremity supported Dynamic Sitting - Level of Assistance: 5: Stand by assistance Static Standing Balance Static Standing - Balance Support:  Bilateral upper extremity supported Static Standing - Level of Assistance: 5: Stand by assistance Dynamic Standing Balance Dynamic Standing - Balance Support: Bilateral upper extremity supported Dynamic Standing - Level of Assistance: 4: Min assist Dynamic Standing - Balance Activities: Reaching for objects Extremity Assessment  RUE Assessment RUE Assessment: Within Functional Limits LUE Assessment LUE Assessment: Exceptions to [facility] RLE Assessment RLE Assessment: Within Functional Limits General Strength Comments: grossly 5/5 LLE Assessment LLE Assessment: Exceptions to [facility] General Strength Comments: grossly 4/5 proximal to distal, DF 3+/5    Rosita DeChalus 09/06/2018, 4:23 PM

## 2018-09-06 NOTE — Progress Notes (Signed)
Occupational Therapy Session Note  Patient Details  Name: Todd Hood MRN: [id number] Date of Birth: 05-16-1975  Today's Date: 09/06/2018 OT Individual Time: 1420-1520 OT Individual Time Calculation (min): 60 min    Short Term Goals: Week 1:  OT Short Term Goal 1 (Week 1): PT will perform mod A standing balance during LB clothing management. OT Short Term Goal 1 - Progress (Week 1): Met OT Short Term Goal 2 (Week 1): Pt will perform UB dressing with mod A. OT Short Term Goal 2 - Progress (Week 1): Met OT Short Term Goal 3 (Week 1): Pt will perform toilet transfer with mod A. OT Short Term Goal 3 - Progress (Week 1): Met  Skilled Therapeutic Interventions/Progress Updates:    1:1 Family education with girlfriend on ADL performance. Practiced and performed tub bench transfer down in the tub room. Pt reports they practiced the stairs today and he will be going up the stairs with A. They report the w/c will go through the bathroom door. Pt performs bathing sit to stand with girlfriends assist. She does more for him that is needed. Not recommended standing in the shower but they performed this task with min A. Discussed option for a suction grab bar but the cons towards one as well. Performed dressing sit to stands; girlfriend always checked for proper feet placement and setup of w/c. Girlfriend does use different handing technique that what was demonstrated but they remained safe.   Also performed bed mobility and toilet transfers and toileting sit to stand.   Discussed and education on DME for home and safe activities to participate with supervision.   Girlfriend checked off to assist pt/.   Therapy Documentation Precautions:  Precautions Precautions: Fall Precaution Comments: Lt hemi, Lt inattention and field cut Restrictions Weight Bearing Restrictions: No Pain:  no c/o pain    Therapy/Group: Individual Therapy  Willeen Cass [facility] 09/06/2018, 4:01 PM

## 2018-09-06 NOTE — Progress Notes (Signed)
Social Work Patient ID: Todd Hood, male   DOB: 02-09-1975, 43 y.o.   MRN: [id number]  Pt's girlfriend was here to finish education but cousin did not show up. Concerns pt will be a high fall risk at home and both are aware of this. Aware of pt;s need for 24 hr physical care. Have made home health referral and equipment should be delivered tomorrow am prior to discharge. PCP appointment made and match for prescriptions. See in am.

## 2018-09-06 NOTE — Progress Notes (Signed)
Social Work Discharge Note   The overall goal for the admission was met for:   Discharge location: [place] 24 HR CARE  Length of Stay: Yes-21 DAYS  Discharge activity level: Yes-MIN ASSIST LEVEL  Home/community participation: Yes  Services provided included: MD, RD, PT, OT, RN, CM, TR, Pharmacy, Neuropsych and SW  Financial Services: Other: PENDING MEDICAID  Follow-up services arranged: Home Health: [place], DME: [facility]-WHEELCHAIR & DROP-ARM BEDSIDE COMMODE and Patient/Family has no preference for HH/DME agencies ADDED TUB BENCH AND ROLLING WALKER  Comments (or additional information):TOSHIA AND PT'S DAD WERE HERE FOR EDUCATION COUSIN WAS SUPPOSE TO COME BUT DID NOT SHOW UP. ALL AWARE PT WILL REQUIRE 24 HR CARE AND CONCERNS BY THERAPY TEAM OF HIS HIGH RISK TO FALL AT HOME. FOLLOW UP PCP [place] 9/2 @ 10:50 AM AND MATCH GIVEN FOR PRESCRIPTION ASSIST.   Patient/Family verbalized understanding of follow-up arrangements: Yes  Individual responsible for coordination of the follow-up plan: SELF & TOSHIA-GIRLFRIEND  Confirmed correct DME delivered: Elease Hashimoto 09/06/2018    Elease Hashimoto

## 2018-09-07 LAB — GLUCOSE, CAPILLARY: Glucose-Capillary: 92 mg/dL (ref 70–99)

## 2018-09-07 MED ORDER — DOCUSATE SODIUM 100 MG PO CAPS: 100.0000 mg | ORAL_CAPSULE | Freq: Two times a day (BID) | ORAL | 0 refills | Status: DC

## 2018-09-07 MED ORDER — CLOPIDOGREL BISULFATE 75 MG PO TABS: 75.0000 mg | ORAL_TABLET | Freq: Every day | ORAL | 0 refills | Status: DC

## 2018-09-07 MED ORDER — CLONIDINE 0.1 MG/24HR TD PTWK: 0.1000 mg | MEDICATED_PATCH | TRANSDERMAL | 0 refills | Status: DC

## 2018-09-07 MED ORDER — POLYETHYLENE GLYCOL 3350 17 G PO PACK: 17.0000 g | PACK | Freq: Two times a day (BID) | ORAL | 0 refills | Status: DC

## 2018-09-07 MED ORDER — ATORVASTATIN CALCIUM 80 MG PO TABS: 80.0000 mg | ORAL_TABLET | Freq: Every day | ORAL | 0 refills | Status: DC

## 2018-09-07 MED ORDER — SENNOSIDES-DOCUSATE SODIUM 8.6-50 MG PO TABS: 2.0000 | ORAL_TABLET | Freq: Every day | ORAL | 0 refills | Status: DC

## 2018-09-07 MED ORDER — METOPROLOL TARTRATE 25 MG PO TABS: 25.0000 mg | ORAL_TABLET | Freq: Two times a day (BID) | ORAL | 0 refills | Status: DC

## 2018-09-07 NOTE — Discharge Instructions (Signed)
Inpatient Rehab Discharge Instructions  Todd QuanJoel Hood Discharge date and time:  09/07/18  Activities/Precautions/ Functional Status: Activity: no lifting, driving, or strenuous exercise till cleared by MD Diet: cardiac diet and diabetic diet Wound Care: none needed   Functional status:  ___ No restrictions     ___ Walk up steps independently _X__ 24/7 supervision/assistance   ___ Walk up steps with assistance ___ Intermittent supervision/assistance  ___ Bathe/dress independently ___ Walk with walker     _X__ Bathe/dress with assistance ___ Walk Independently    ___ Shower independently _X__ Walk with assistance    ___ Shower with assistance ___ No alcohol     ___ Return to work/school ________   Special Instructions: 1. He is on multiple laxatives--colace, Senna and Miralax--these can be decreased if he is having loose or frequent stools.    STROKE/TIA DISCHARGE INSTRUCTIONS SMOKING Cigarette smoking nearly doubles your risk of having a stroke & is the single most alterable risk factor  If you smoke or have smoked in the last 12 months, you are advised to quit smoking for your health.  Most of the excess cardiovascular risk related to smoking disappears within a year of stopping.  Ask you doctor about anti-smoking medications  [place] Quit Line: 1-800-QUIT NOW  Free Smoking Cessation Classes [phone]  CHOLESTEROL Know your levels; limit fat & cholesterol in your diet  Lipid Panel     Component Value Date/Time   CHOL 136 08/16/2018 0423   TRIG 112 08/16/2018 0423   HDL 30 (L) 08/16/2018 0423   CHOLHDL 4.5 08/16/2018 0423   VLDL 22 08/16/2018 0423   LDLCALC 84 08/16/2018 0423      Many patients benefit from treatment even if their cholesterol is at goal.  Goal: Total Cholesterol (CHOL) less than 160  Goal:  Triglycerides (TRIG) less than 150  Goal:  HDL greater than 40  Goal:  LDL (LDLCALC) less than 100   BLOOD PRESSURE American Stroke Association blood  pressure target is less that 120/80 mm/Hg  Your discharge blood pressure is:  BP: 131/69  Monitor your blood pressure  Limit your salt and alcohol intake  Many individuals will require more than one medication for high blood pressure  DIABETES (A1c is a blood sugar average for last 3 months) Goal HGBA1c is under 7% (HBGA1c is blood sugar average for last 3 months)  Diabetes: Pre diabetic    Lab Results  Component Value Date   HGBA1C 6.2 (H) 08/15/2018     Your HGBA1c can be lowered with medications, healthy diet, and exercise.  Check your blood sugar as directed by your physician  Call your physician if you experience unexplained or low blood sugars.  PHYSICAL ACTIVITY/REHABILITATION Goal is 30 minutes at least 4 days per week  Activity: No driving, Therapies: see below  Return to work: NA  Activity decreases your risk of heart attack and stroke and makes your heart stronger.  It helps control your weight and blood pressure; helps you relax and can improve your mood.  Participate in a regular exercise program.  Talk with your doctor about the best form of exercise for you (dancing, walking, swimming, cycling).  DIET/WEIGHT Goal is to maintain a healthy weight  Your discharge diet is:  Diet Order            Diet heart healthy/carb modified Room service appropriate? Yes; Fluid consistency: Thin  Diet effective now             iquids Your  height is:  Height: 5\' 9"  (175.3 cm) Your current weight is: Weight: 90.1 kg Your Body Mass Index (BMI) is:  BMI (Calculated): 29.32  Following the type of diet specifically designed for you will help prevent another stroke.  Your goal weight is:    Your goal Body Mass Index (BMI) is 19-24.  Healthy food habits can help reduce 3 risk factors for stroke:  High cholesterol, hypertension, and excess weight.  RESOURCES Stroke/Support Group:  Call [phone]   STROKE EDUCATION PROVIDED/REVIEWED AND GIVEN TO PATIENT Stroke warning signs  and symptoms How to activate emergency medical system (call 911). Medications prescribed at discharge. Need for follow-up after discharge. Personal risk factors for stroke. Pneumonia vaccine given:  Flu vaccine given:  My questions have been answered, the writing is legible, and I understand these instructions.  I will adhere to these goals & educational materials that have been provided to me after my discharge from the hospital.      [place]:    Home Health:   [place] Phone:[phone]   Date of last service:09/07/2018  Medical Equipment/Items Ordered:WHEELCHAIR & DROP-ARM BEDSIDE COMMODE  Agency/Supplier:[facility] PCP APPOINTMENT AT [place] 9/2 10:50 AM   My questions have been answered and I understand these instructions. I will adhere to these goals and the provided educational materials after my discharge from the hospital.  Patient/Caregiver Signature _______________________________ Date __________  Clinician Signature _______________________________________ Date __________  Please bring this form and your medication list with you to all your follow-up doctor's appointments.

## 2018-09-07 NOTE — Discharge Summary (Signed)
Physician Discharge Summary  Patient ID: Todd Hood MRN: [id number] DOB/AGE: 41/01/1975 41 y.o.  Admit date: 08/17/2018 Discharge date: 09/07/2018  Discharge Diagnoses:  Principal Problem:   Right thalamic stroke [facility](HCC) Active Problems:   HTN (hypertension)   Prediabetes   Dyslipidemia   Hemiparesis affecting left side as late effect of stroke (HCC)   Pseudobulbar affect   Elevated serum creatinine   Slow transit constipation   Discharged Condition: Stable   Significant Diagnostic Studies: N/A   Labs:  Basic Metabolic Panel: BMP Latest Ref Rng & Units 09/05/2018 08/29/2018 08/22/2018  Glucose 70 - 99 mg/dL 409(W102(H) 119(J140(H) 478(G122(H)  BUN 6 - 20 mg/dL 19 17 19   Creatinine 0.61 - 1.24 mg/dL 9.56(O1.32(H) 1.301.10 8.651.05  Sodium 135 - 145 mmol/L 141 141 139  Potassium 3.5 - 5.1 mmol/L 4.5 3.9 3.8  Chloride 98 - 111 mmol/L 108 108 104  CO2 22 - 32 mmol/L 25 23 25   Calcium 8.9 - 10.3 mg/dL 9.2 9.4 9.7    CBC: CBC Latest Ref Rng & Units 09/05/2018 08/29/2018 08/22/2018  WBC 4.0 - 10.5 K/uL 5.8 8.3 9.7  Hemoglobin 13.0 - 17.0 g/dL 78.414.3 69.615.6 29.515.9  Hematocrit 39.0 - 52.0 % 38.9(L) 44.0 44.3  Platelets 150 - 400 K/uL 248 278 243    CBG: Recent Labs  Lab 09/04/18 0622 09/05/18 0619 09/06/18 0559 09/06/18 2126 09/07/18 0635  GLUCAP 103* 105* 103* 114* 92    Brief HPI:   Todd Hood is a 43 y.o. male with history of HTN R-CVA 2/20 with residual mild left sided weakness and with loss of left peripheral vision; who was admitted on 08/14/18 after a fall striking the back of his head with increase in left sided weakness, left facial weakness and dysarthria. Patient reported having stopped taking Plavix in hopes of getting dental work done. UDS positive for THC.  CTA/perfusion was negative for LVO an showed chronic R-PCA occlusion with right PCA territory encephalomalacia. MRI brain revealed acute R-thalamic, midbrain and internal capsule infarcts.   Dr. Pearlean BrownieSethi recommended loop recorder due to  concerns that of stroke due to unknown embolic source but patient declined this. TEE was negative for thrombus or shunting and showed intra-atrial septal aneurysm.  He continued to be limited by left hemiparesis with right ptosis and left HH as well as left lateral lean with standing attempts. CIR recommended for follow up therapy.    Hospital Course: Todd Hood was admitted to rehab 08/17/2018 for inpatient therapies to consist of PT, ST and OT at least three hours five days a week. Past admission physiatrist, therapy team and rehab RN have worked together to provide customized collaborative inpatient rehab. His blood pressures were monitored on TID basis and were noted to be labile. Metoprolol was titrated upwards and Catapres TTS 1 patch was added on 8/24 for more consistent relief. Diet was modified to CM medium,  BS were monitored with ac/hs CBG checks. Blood sugars have been well controlled with use of SSI for elevated BS as needed. He was maintained on DAPT for 3 weeks and ASA was discontinued at discharge.     Follow up labs showed rise in SCr to 1.32 and he was advised to encourage fluid intake. Transient hypokalemia has resolved with brief supplementation. Bowel program has been augmented and constipation has resolved with addition of Senna S. He is continent of bowel and bladder.  He is continent of bowel and bladder. He has been educated on importance of medication compliance and cessation of  THC use.   He has made gains during rehab stay and continues to be limited by left inattention with motor planning deficits, hemiplegia and impulsivity. His family has been educated on need for min assist for safety with all mobility due to high fall risk.  He will continue to receive follow up HHPT and [place] [facility] after discharge  Rehab course: During patient's stay in rehab weekly team conferences were held to monitor patient's progress, set goals and discuss barriers to discharge. At  admission, patient required total assist with ADL tasks and max assist for mobility. He has had improvement in activity tolerance, balance, postural control as well as ability to compensate for deficits. He has had improvement in functional use LUE and LLE as well as improvement in awareness. He is able to complete ADL tasks with min assist for UB care and min to mod assist for toilet transfers.  He required mod assist for transfers and mod assist with to ambulate 10' with ability to activate left quad with verbal cues and manual facilitation. Family education was completed regarding all aspects of care and safety.   Disposition: Home  Diet: Heart Healthy/Carb modified.   Special Instructions: 1. Needs physical assistance with all mobility.  2. Adjust laxatives as needed to manage constipation.  3. Needs BMET rechecked in 1-2 weeks to monitor SCr.   Discharge Instructions    Ambulatory referral to Physical Medicine Rehab   Complete by: As directed    1-2 weeks transitional care appt     Allergies as of 09/07/2018   No Known Allergies     Medication List    STOP taking these medications   amLODipine 10 MG tablet Commonly known as: NORVASC   aspirin 325 MG tablet   gabapentin 300 MG capsule Commonly known as: NEURONTIN   losartan 50 MG tablet Commonly known as: COZAAR     TAKE these medications   acetaminophen 650 MG CR tablet Commonly known as: TYLENOL Take 650 mg by mouth every 8 (eight) hours as needed for pain.   atorvastatin 80 MG tablet Commonly known as: LIPITOR Take 1 tablet (80 mg total) by mouth at bedtime.   Centrum Silver 50+Men Tabs Take 1 tablet by mouth daily with breakfast.   cloNIDine 0.1 mg/24hr patch Commonly known as: CATAPRES - Dosed in mg/24 hr Place 1 patch (0.1 mg total) onto the skin every Monday. Start taking on: September 12, 2018   clopidogrel 75 MG tablet Commonly known as: PLAVIX Take 1 tablet (75 mg total) by mouth at bedtime.    docusate sodium 100 MG capsule Commonly known as: COLACE Take 1 capsule (100 mg total) by mouth 2 (two) times daily.   metoprolol tartrate 25 MG tablet Commonly known as: LOPRESSOR Take 1 tablet (25 mg total) by mouth 2 (two) times daily.   polyethylene glycol 17 g packet Commonly known as: MIRALAX / GLYCOLAX Take 17 g by mouth 2 (two) times daily.   senna-docusate 8.6-50 MG tablet Commonly known as: Senokot-S Take 2 tablets by mouth at bedtime.      Follow-up Information    Kirsteins, Luanna Salk, MD Follow up.   Specialty: Physical Medicine and Rehabilitation Why: Office will call you with follow up appointment Contact information: [place] [address] [phone]        [facility] Follow up.   Why: for stroke follow up Contact information: [place]     [place]  [place] [phone]       Dan Maker, MD Follow up.   Specialty: Family Medicine Contact information: [address] [place] [address] [phone]        [facility] Follow up on 09/14/2018.   Why: Appointment @ 10:50 AM Contact information: [address] [place] [address] [phone]          Signed: Jacquelynn Cree 09/07/2018, 9:47 AM

## 2018-09-07 NOTE — Progress Notes (Signed)
Patient was discharged home.  Patient left floor via wheelchair escorted by nursing staff.  Patient verbalized understanding of discharge instructions as given by  Algis Liming, PA.  All patient belongings sent with patient including DME and prescriptions.  Patient appears to be in no immediate distress at this time.    Brita Romp, RN

## 2018-09-07 NOTE — Progress Notes (Signed)
[place] PHYSICAL MEDICINE & REHABILITATION PROGRESS NOTE  Subjective:. No issues overnite  ROS: Limited due to cognition, but appears to deny CP, shortness of breath, nausea, vomiting or diarrhea.   Objective:   No results found. Recent Labs    09/05/18 0607  WBC 5.8  HGB 14.3  HCT 38.9*  PLT 248   Recent Labs    09/05/18 0607  NA 141  K 4.5  CL 108  CO2 25  GLUCOSE 102*  BUN 19  CREATININE 1.32*  CALCIUM 9.2    Intake/Output Summary (Last 24 hours) at 09/07/2018 0731 Last data filed at 09/07/2018 0342 Gross per 24 hour  Intake 590 ml  Output 1025 ml  Net -435 ml     Physical Exam: Vital Signs Blood pressure 131/69, pulse 75, temperature 98.6 F (37 C), resp. rate 18, height 5\' 9"  (1.753 m), weight 90.1 kg, SpO2 98 %. Constitutional: No distress . Vital signs reviewed. HENT: Normocephalic.  Atraumatic. Eyes: EOMI. No discharge. Cardiovascular: No JVD. Respiratory: Normal effort.  No stridor. GI: Non-distended. Skin: Warm and dry.  Intact. Musc: No edema.  No tenderness. Neurological: Alert Poor left lid closure with reduced upward gaze.  Left facial weakness Motor: RUE/RLE: 5/5 proximal to distal LUE: Should abduction 1+/5, elbow flex/ext 2-/5, hand grip 3/5.  LLE: HF, KE 3-/5, ADF/APF 1/5 Increase in tone noted Sensation decreased to LT left Psychiatric: pleasantn   Assessment/Plan: 1. Functional deficits secondary to Right thalamic infarc Stable for D/C today F/u PCP in 3-4 weeks F/u PM&R 2 weeks See D/C summary See D/C instructions Care Tool:  Bathing  Bathing activity did not occur: Refused Body parts bathed by patient: Chest, Abdomen, Front perineal area, Right upper leg, Left upper leg, Face, Right lower leg, Left arm, Left lower leg   Body parts bathed by helper: Buttocks, Right arm     Bathing assist Assist Level: Minimal Assistance - Patient > 75%     Upper Body Dressing/Undressing Upper body dressing   What is the patient  wearing?: Pull over shirt    Upper body assist Assist Level: Minimal Assistance - Patient > 75%    Lower Body Dressing/Undressing Lower body dressing    Lower body dressing activity did not occur: Refused What is the patient wearing?: Pants, Incontinence brief     Lower body assist Assist for lower body dressing: Minimal Assistance - Patient > 75%     Toileting Toileting    Toileting assist Assist for toileting: Minimal Assistance - Patient > 75%     Transfers Chair/bed transfer  Transfers assist  Chair/bed transfer activity did not occur: Safety/medical concerns  Chair/bed transfer assist level: Contact Guard/Touching assist     Locomotion Ambulation   Ambulation assist      Assist level: Moderate Assistance - Patient 50 - 74% Assistive device: Walker-rolling Max distance: 75ft   Walk 10 feet activity   Assist  Walk 10 feet activity did not occur: Safety/medical concerns  Assist level: Moderate Assistance - Patient - 50 - 74% Assistive device: Walker-rolling   Walk 50 feet activity   Assist Walk 50 feet with 2 turns activity did not occur: Safety/medical concerns  Assist level: 2 helpers      Walk 150 feet activity   Assist Walk 150 feet activity did not occur: Safety/medical concerns         Walk 10 feet on uneven surface  activity   Assist Walk 10 feet on uneven surfaces activity did not occur: Safety/medical  concerns         Wheelchair     Assist Will patient use wheelchair at discharge?: Yes Type of Wheelchair: Manual    Wheelchair assist level: Supervision/Verbal cueing Max wheelchair distance: 15250ft    Wheelchair 50 feet with 2 turns activity    Assist        Assist Level: Supervision/Verbal cueing   Wheelchair 150 feet activity     Assist     Assist Level: Supervision/Verbal cueing    Medical Problem List and Plan: 1. Left hemiparesisand left lateral lean secondary to  acute right thalamic,  midbrain and internal capsule infarcts.    Medically stable for d/c today  2.  Antithrombotics: -DVT/anticoagulation:  Pharmaceutical: Lovenox             -antiplatelet therapy: ASA and Plavix x3, transitioned to Plavix alone on 8/26 3. Pain Management: N/A 4. Mood: LCSW to follow for evaluation and support             -antipsychotic agents: N/A 5. Neuropsych: This patient is not fully capable of making decisions on his own behalf. 6. Skin/Wound Care: Routine pressure relief measures 7. Fluids/Electrolytes/Nutrition: Monitor I/Os.                encourage fluids, po 8.  Prediabetes: Hemoglobin A1c is-6.2. Carb modified restrictions added to diet.     Stable on 8/25 9.  Hypokalemia:   K+ 4.5 on 8/24 10.  Dyslipidemia: On Lipitor 11.  HTN:   Vitals:   09/06/18 2108 09/07/18 0500  BP: (!) 146/94 131/69  Pulse: 68 75  Resp:  18  Temp:  98.6 F (37 C)  SpO2: 100% 98%   8/17  Increased metoprolol to 25mg  bid  begin catapres patch 1tts  Relatively stable on 8/26 12. Substance abuse: Counsel 13. Constipation:   -continue senna-s   -dulcolax suppository pr  Improving on 8/25 14. Elevated Cr.  Cr. 1.32 on 8/24  Encourage fluids    LOS: 21 days A FACE TO FACE EVALUATION WAS PERFORMED  Erick Colacendrew E Briceyda Abdullah 09/07/2018, 7:31 AM

## 2018-09-13 NOTE — Progress Notes (Signed)
Patient ID: Todd Hood, male   DOB: 10/15/1975, 43 y.o.   MRN: [id number]   Virtual Visit via Telephone Note  I connected with Todd Hood on 09/14/18 at 10:50 AM EDT by telephone and verified that I am speaking with the correct person using two identifiers.   I discussed the limitations, risks, security and privacy concerns of performing an evaluation and management service by telephone and the availability of in person appointments. I also discussed with the patient that there may be a patient responsible charge related to this service. The patient expressed understanding and agreed to proceed.  Patient location:  home [facility] Persons on the call:  Me and the patient   History of Present Illness: After hospitalization from 8/2-08/17/2018 for stroke.  Then in rehab 8/05/19/24/2020.  He is doing well.  Walking some with a walker but not walking much.  Not able to perform ADL.  No pain.  Has not made neurology f/up appt.  Has appt with Dr Wynn BankerKirsteins 9/9. He has help at home.  No fevers.  Compliant with meds.  He will need RF on meds   From discharge summary: Discharge Diagnoses:  Principal Problem:   Acute ischemic stroke (HCC) Active Problems:   HTN (hypertension)   Prediabetes   Marijuana use    Brief Summary: Todd ClossJoel Jones-Beyis a 43 y/o male with h/oLarge subacute right PCA stroke treated at [facility] 03/01/18/2020withresidualleft eyevisual impairmentand left sided weakness, HTN, prediabetes, HLD who presents with increasing left sided weakness and gait disturbance.   MRI on 8/2 > acute right midbrain, thalamic, internal capsule infarcts.Chronic RIGHT occipital and posterior temporal PCA territory infarcts.  Hospital Course:  Principal Problem: Acute ischemic stroke - h/o prior infarct as mentioned above - now has worsening of prior left sided weakness.  - TCD with bubble study negative for PFO - 2 D ECHO unrevealing - TEE showed no cardiac source  or PFO- found to have a septal aneurysm - he has declined a loop recorder due to cost- he had a 30 day event monitor previously which did not find any arrythmias  - A1c 6.2 - LDL 84 - per neuro, cont Aspirin and Plavix for 3 wks followed by Plavix alone - cont statin - awaiting CIR to confirm if they will accept him for rehab  Active Problems: HTN (hypertension) - currently allowing permissive HTN and Amlodipine and Losartan on hold  Hypokalemia - replaced- recheck tomorrow  Prediabetes - A1c 6.2 - needs dietary changes and weight loss  Intra-atrial septal aneurysm - f/u as outpt  Marijuana use - advised to quit  From rehab summary: Brief HPI:   Todd Hood is a 43 y.o. male with history of HTN R-CVA 2/20 with residual mild left sided weakness and with loss of left peripheral vision; who was admitted on 08/14/18 after a fall striking the back of his head with increase in left sided weakness, left facial weakness and dysarthria. Patient reported having stopped taking Plavix in hopes of getting dental work done. UDS positive for THC.  CTA/perfusion was negative for LVO an showed chronic R-PCA occlusion with right PCA territory encephalomalacia. MRI brain revealed acute R-thalamic, midbrain and internal capsule infarcts.   Dr. Pearlean BrownieSethi recommended loop recorder due to concerns that of stroke due to unknown embolic source but patient declined this. TEE was negative for thrombus or shunting and showed intra-atrial septal aneurysm.  He continued to be limited by left hemiparesis with right ptosis and left HH as well  as left lateral lean with standing attempts. CIR recommended for follow up therapy.    Hospital Course: Mattheu Brodersen was admitted to rehab 08/17/2018 for inpatient therapies to consist of PT, ST and OT at least three hours five days a week. Past admission physiatrist, therapy team and rehab RN have worked together to provide customized collaborative inpatient rehab.  His blood pressures were monitored on TID basis and were noted to be labile. Metoprolol was titrated upwards and Catapres TTS 1 patch was added on 8/24 for more consistent relief. Diet was modified to CM medium,  BS were monitored with ac/hs CBG checks. Blood sugars have been well controlled with use of SSI for elevated BS as needed. He was maintained on DAPT for 3 weeks and ASA was discontinued at discharge.     Follow up labs showed rise in SCr to 1.32 and he was advised to encourage fluid intake. Transient hypokalemia has resolved with brief supplementation. Bowel program has been augmented and constipation has resolved with addition of Senna S. He is continent of bowel and bladder.  He is continent of bowel and bladder. He has been educated on importance of medication compliance and cessation of THC use.   He has made gains during rehab stay and continues to be limited by left inattention with motor planning deficits, hemiplegia and impulsivity. His family has been educated on need for min assist for safety with all mobility due to high fall risk.  He will continue to receive follow up HHPT and [place] [facility] after discharge  Rehab course: During patient's stay in rehab weekly team conferences were held to monitor patient's progress, set goals and discuss barriers to discharge. At admission, patient required total assist with ADL tasks and max assist for mobility. He has had improvement in activity tolerance, balance, postural control as well as ability to compensate for deficits. He has had improvement in functional use LUE and LLE as well as improvement in awareness. He is able to complete ADL tasks with min assist for UB care and min to mod assist for toilet transfers.  He required mod assist for transfers and mod assist with to ambulate 10' with ability to activate left quad with verbal cues and manual facilitation. Family education was completed regarding all aspects of care and safety.    Disposition: Home  Diet: Heart Healthy/Carb modified.   Special Instructions: 1. Needs physical assistance with all mobility.  2. Adjust laxatives as needed to manage constipation.  3. Needs BMET rechecked in 1-2 weeks to monitor SCr.   Observations/Objective:  NAD.  A&Ox3   Assessment and Plan: 1. Essential hypertension Check BP daily at home and record - Comprehensive metabolic panel; Future - metoprolol tartrate (LOPRESSOR) 25 MG tablet; Take 1 tablet (25 mg total) by mouth 2 (two) times daily.  Dispense: 60 tablet; Refill: 3 - cloNIDine (CATAPRES - DOSED IN MG/24 HR) 0.1 mg/24hr patch; Place 1 patch (0.1 mg total) onto the skin every Monday.  Dispense: 4 patch; Refill: 3  2. Acute ischemic stroke (HCC) - Comprehensive metabolic panel; Future - clopidogrel (PLAVIX) 75 MG tablet; Take 1 tablet (75 mg total) by mouth at bedtime.  Dispense: 30 tablet; Refill: 3 - Ambulatory referral to Neurology  3. Dyslipidemia - atorvastatin (LIPITOR) 80 MG tablet; Take 1 tablet (80 mg total) by mouth at bedtime.  Dispense: 30 tablet; Refill: 3  4. Elevated serum creatinine - Comprehensive metabolic panel; Future  5. Prediabetes I have had a lengthy discussion  and provided education about insulin resistance and the intake of too much sugar/refined carbohydrates.  I have advised the patient to work at a goal of eliminating sugary drinks, candy, desserts, sweets, refined sugars, processed foods, and white carbohydrates.  The patient expresses understanding.  -consider metformin pending Cr/GFR   6. Hospital discharge follow-up    Follow Up Instructions: Assign PCP 6 weeks;  Sooner if needed   I discussed the assessment and treatment plan with the patient. The patient was provided an opportunity to ask questions and all were answered. The patient agreed with the plan and demonstrated an understanding of the instructions.   The patient was advised to call back or seek an in-person  evaluation if the symptoms worsen or if the condition fails to improve as anticipated.  I provided 15 minutes of non-face-to-face time during this encounter.   Georgian Co, PA-C

## 2018-09-14 ENCOUNTER — Ambulatory Visit: Payer: Medicaid Other | Attending: Family Medicine | Admitting: Physician Assistant

## 2018-09-14 ENCOUNTER — Other Ambulatory Visit: Payer: Self-pay

## 2018-09-14 DIAGNOSIS — R7989 Other specified abnormal findings of blood chemistry: Secondary | ICD-10-CM

## 2018-09-14 DIAGNOSIS — Z09 Encounter for follow-up examination after completed treatment for conditions other than malignant neoplasm: Secondary | ICD-10-CM

## 2018-09-14 DIAGNOSIS — I10 Essential (primary) hypertension: Secondary | ICD-10-CM

## 2018-09-14 DIAGNOSIS — I639 Cerebral infarction, unspecified: Secondary | ICD-10-CM | POA: Diagnosis not present

## 2018-09-14 DIAGNOSIS — E785 Hyperlipidemia, unspecified: Secondary | ICD-10-CM | POA: Diagnosis not present

## 2018-09-14 DIAGNOSIS — R7303 Prediabetes: Secondary | ICD-10-CM

## 2018-09-14 MED ORDER — METOPROLOL TARTRATE 25 MG PO TABS: 25.0000 mg | ORAL_TABLET | Freq: Two times a day (BID) | ORAL | 3 refills | Status: AC

## 2018-09-14 MED ORDER — CLOPIDOGREL BISULFATE 75 MG PO TABS: 75.0000 mg | ORAL_TABLET | Freq: Every day | ORAL | 3 refills | Status: DC

## 2018-09-14 MED ORDER — ATORVASTATIN CALCIUM 80 MG PO TABS: 80.0000 mg | ORAL_TABLET | Freq: Every day | ORAL | 3 refills | Status: AC

## 2018-09-14 MED ORDER — CLONIDINE 0.1 MG/24HR TD PTWK: 0.1000 mg | MEDICATED_PATCH | TRANSDERMAL | 3 refills | Status: DC

## 2018-09-14 MED FILL — CLOPIDOGREL 75 MG TABLET: 75 | 30 days supply | Qty: 30 | Fill #0

## 2018-09-14 MED FILL — METOPROLOL TARTRATE 25 MG T: 25 | 30 days supply | Qty: 60 | Fill #0

## 2018-09-14 MED FILL — CLONIDINE 0.1 MG/24HR PTWK: 0.1 | 30 days supply | Qty: 4 | Fill #0

## 2018-09-14 MED FILL — ATORVASTATIN 80 MG TABLET: 80 | 30 days supply | Qty: 30 | Fill #0

## 2018-09-21 ENCOUNTER — Other Ambulatory Visit: Payer: Self-pay

## 2018-09-21 ENCOUNTER — Ambulatory Visit: Payer: Self-pay | Attending: Family Medicine

## 2018-09-21 ENCOUNTER — Encounter: Payer: Self-pay | Attending: Registered Nurse | Admitting: Registered Nurse

## 2018-09-21 DIAGNOSIS — R7989 Other specified abnormal findings of blood chemistry: Secondary | ICD-10-CM

## 2018-09-21 DIAGNOSIS — I639 Cerebral infarction, unspecified: Secondary | ICD-10-CM

## 2018-09-21 DIAGNOSIS — I10 Essential (primary) hypertension: Secondary | ICD-10-CM

## 2018-09-22 LAB — COMPREHENSIVE METABOLIC PANEL
ALT: 53 IU/L — ABNORMAL HIGH (ref 0–44)
AST: 23 IU/L (ref 0–40)
Albumin/Globulin Ratio: 1.6 (ref 1.2–2.2)
Albumin: 4.2 g/dL (ref 4.0–5.0)
Alkaline Phosphatase: 111 IU/L (ref 39–117)
BUN/Creatinine Ratio: 15 (ref 9–20)
BUN: 13 mg/dL (ref 6–24)
Bilirubin Total: 0.8 mg/dL (ref 0.0–1.2)
CO2: 25 mmol/L (ref 20–29)
Calcium: 9.7 mg/dL (ref 8.7–10.2)
Chloride: 105 mmol/L (ref 96–106)
Creatinine, Ser: 0.89 mg/dL (ref 0.76–1.27)
GFR calc Af Amer: 123 mL/min/{1.73_m2} (ref >59)
GFR calc non Af Amer: 106 mL/min/{1.73_m2} (ref >59)
Globulin, Total: 2.7 g/dL (ref 1.5–4.5)
Glucose: 155 mg/dL — ABNORMAL HIGH (ref 65–99)
Potassium: 4 mmol/L (ref 3.5–5.2)
Sodium: 141 mmol/L (ref 134–144)
Total Protein: 6.9 g/dL (ref 6.0–8.5)

## 2018-10-12 ENCOUNTER — Ambulatory Visit: Payer: Self-pay | Admitting: Family Medicine

## 2018-10-17 MED FILL — CLONIDINE 0.1 MG/24HR PTWK: 0.1 | 30 days supply | Qty: 4 | Fill #0

## 2018-10-17 MED FILL — METOPROLOL TARTRATE 25 MG T: 25 | 30 days supply | Qty: 60 | Fill #0

## 2018-10-17 MED FILL — ATORVASTATIN 80 MG TABLET: 80 | 30 days supply | Qty: 30 | Fill #0

## 2018-10-17 MED FILL — CLOPIDOGREL 75 MG TABLET: 75 | 30 days supply | Qty: 30 | Fill #0

## 2018-10-20 ENCOUNTER — Emergency Department (HOSPITAL_COMMUNITY)
Admission: EM | Admit: 2018-10-20 | Discharge: 2018-10-20 | Disposition: A | Discharge status: Home / Self Care | Payer: Medicaid Other | Attending: Emergency Medicine | Admitting: Emergency Medicine

## 2018-10-20 ENCOUNTER — Encounter (HOSPITAL_COMMUNITY): Payer: Self-pay | Admitting: Emergency Medicine

## 2018-10-20 ENCOUNTER — Other Ambulatory Visit: Payer: Self-pay

## 2018-10-20 DIAGNOSIS — Z5321 Procedure and treatment not carried out due to patient leaving prior to being seen by health care provider: Secondary | ICD-10-CM | POA: Insufficient documentation

## 2018-10-20 DIAGNOSIS — R739 Hyperglycemia, unspecified: Secondary | ICD-10-CM | POA: Diagnosis present

## 2018-10-20 LAB — BASIC METABOLIC PANEL
Anion gap: 14 (ref 5–15)
BUN: 17 mg/dL (ref 6–20)
CO2: 18 mmol/L — ABNORMAL LOW (ref 22–32)
Calcium: 9.4 mg/dL (ref 8.9–10.3)
Chloride: 100 mmol/L (ref 98–111)
Creatinine, Ser: 0.94 mg/dL (ref 0.61–1.24)
GFR calc Af Amer: >60 mL/min (ref >60)
GFR calc non Af Amer: >60 mL/min (ref >60)
Glucose, Bld: 489 mg/dL — ABNORMAL HIGH (ref 70–99)
Potassium: 3.7 mmol/L (ref 3.5–5.1)
Sodium: 132 mmol/L — ABNORMAL LOW (ref 135–145)

## 2018-10-20 LAB — CBC
HCT: 44.1 % (ref 39.0–52.0)
Hemoglobin: 16.5 g/dL (ref 13.0–17.0)
MCH: 31.9 pg (ref 26.0–34.0)
MCHC: 37.4 g/dL — ABNORMAL HIGH (ref 30.0–36.0)
MCV: 85.3 fL (ref 80.0–100.0)
Platelets: 231 10*3/uL (ref 150–400)
RBC: 5.17 MIL/uL (ref 4.22–5.81)
RDW: 12.8 % (ref 11.5–15.5)
WBC: 6.6 10*3/uL (ref 4.0–10.5)
nRBC: 0 % (ref 0.0–0.2)

## 2018-10-20 LAB — CBG MONITORING, ED: Glucose-Capillary: 411 mg/dL — ABNORMAL HIGH (ref 70–99)

## 2018-10-20 NOTE — ED Triage Notes (Signed)
Pt reports that he took his blood sugar with his mother's glucometer and it read 500. Pt states he is "pre-diabetic", recent CVA.

## 2018-11-15 MED FILL — ATORVASTATIN 80 MG TABLET: 80 | 30 days supply | Qty: 30 | Fill #1

## 2018-11-15 MED FILL — CLOPIDOGREL 75 MG TABLET: 75 | 30 days supply | Qty: 30 | Fill #1

## 2018-11-15 MED FILL — METOPROLOL TARTRATE 25 MG T: 25 | 30 days supply | Qty: 60 | Fill #1

## 2018-12-21 ENCOUNTER — Other Ambulatory Visit: Payer: Self-pay | Admitting: Physician Assistant

## 2018-12-21 ENCOUNTER — Other Ambulatory Visit: Payer: Self-pay | Admitting: Physical Medicine and Rehabilitation

## 2018-12-21 DIAGNOSIS — I639 Cerebral infarction, unspecified: Secondary | ICD-10-CM

## 2018-12-21 MED FILL — METOPROLOL TARTRATE 25 MG T: 25 | 30 days supply | Qty: 60 | Fill #2

## 2018-12-21 MED FILL — ATORVASTATIN 80 MG TABLET: 80 | 30 days supply | Qty: 30 | Fill #2

## 2018-12-21 MED FILL — CLOPIDOGREL 75 MG TABLET: 75 | 30 days supply | Qty: 30 | Fill #2

## 2019-01-11 MED FILL — METOPROLOL TARTRATE 25 MG T: 25 | 30 days supply | Qty: 60 | Fill #2

## 2019-01-11 MED FILL — ATORVASTATIN 80 MG TABLET: 80 | 30 days supply | Qty: 30 | Fill #2

## 2019-01-11 MED FILL — CLOPIDOGREL 75 MG TABLET: 75 | 30 days supply | Qty: 30 | Fill #2

## 2019-03-03 ENCOUNTER — Other Ambulatory Visit: Payer: Self-pay | Admitting: Physician Assistant

## 2019-03-03 DIAGNOSIS — I639 Cerebral infarction, unspecified: Secondary | ICD-10-CM

## 2019-03-17 ENCOUNTER — Encounter (HOSPITAL_COMMUNITY): Payer: Self-pay

## 2019-03-17 ENCOUNTER — Observation Stay (HOSPITAL_COMMUNITY): Payer: Medicaid Other

## 2019-03-17 ENCOUNTER — Emergency Department (HOSPITAL_COMMUNITY): Payer: Medicaid Other

## 2019-03-17 ENCOUNTER — Other Ambulatory Visit: Payer: Self-pay

## 2019-03-17 ENCOUNTER — Observation Stay (HOSPITAL_COMMUNITY)
Admission: EM | Admit: 2019-03-17 | Discharge: 2019-03-18 | Disposition: A | Discharge status: Home / Self Care | Payer: Medicaid Other | Attending: Internal Medicine | Admitting: Internal Medicine

## 2019-03-17 DIAGNOSIS — Z7902 Long term (current) use of antithrombotics/antiplatelets: Secondary | ICD-10-CM | POA: Diagnosis not present

## 2019-03-17 DIAGNOSIS — Z87891 Personal history of nicotine dependence: Secondary | ICD-10-CM | POA: Diagnosis not present

## 2019-03-17 DIAGNOSIS — Z20822 Contact with and (suspected) exposure to covid-19: Secondary | ICD-10-CM | POA: Insufficient documentation

## 2019-03-17 DIAGNOSIS — E119 Type 2 diabetes mellitus without complications: Secondary | ICD-10-CM | POA: Diagnosis not present

## 2019-03-17 DIAGNOSIS — R000 Tachycardia, unspecified: Secondary | ICD-10-CM | POA: Diagnosis not present

## 2019-03-17 DIAGNOSIS — R569 Unspecified convulsions: Principal | ICD-10-CM | POA: Insufficient documentation

## 2019-03-17 DIAGNOSIS — Z7982 Long term (current) use of aspirin: Secondary | ICD-10-CM | POA: Insufficient documentation

## 2019-03-17 DIAGNOSIS — I10 Essential (primary) hypertension: Secondary | ICD-10-CM | POA: Diagnosis not present

## 2019-03-17 DIAGNOSIS — I639 Cerebral infarction, unspecified: Secondary | ICD-10-CM

## 2019-03-17 DIAGNOSIS — Z79899 Other long term (current) drug therapy: Secondary | ICD-10-CM | POA: Diagnosis not present

## 2019-03-17 DIAGNOSIS — Z7984 Long term (current) use of oral hypoglycemic drugs: Secondary | ICD-10-CM

## 2019-03-17 DIAGNOSIS — I69354 Hemiplegia and hemiparesis following cerebral infarction affecting left non-dominant side: Secondary | ICD-10-CM | POA: Insufficient documentation

## 2019-03-17 LAB — I-STAT CHEM 8, ED
BUN: 16 mg/dL (ref 6–20)
Calcium, Ion: 1.18 mmol/L (ref 1.15–1.40)
Chloride: 114 mmol/L — ABNORMAL HIGH (ref 98–111)
Creatinine, Ser: 0.6 mg/dL — ABNORMAL LOW (ref 0.61–1.24)
Glucose, Bld: 143 mg/dL — ABNORMAL HIGH (ref 70–99)
HCT: 38 % — ABNORMAL LOW (ref 39.0–52.0)
Hemoglobin: 12.9 g/dL — ABNORMAL LOW (ref 13.0–17.0)
Potassium: 3 mmol/L — ABNORMAL LOW (ref 3.5–5.1)
Sodium: 146 mmol/L — ABNORMAL HIGH (ref 135–145)
TCO2: 20 mmol/L — ABNORMAL LOW (ref 22–32)

## 2019-03-17 LAB — COMPREHENSIVE METABOLIC PANEL
ALT: 17 U/L (ref 0–44)
AST: 19 U/L (ref 15–41)
Albumin: 3.6 g/dL (ref 3.5–5.0)
Alkaline Phosphatase: 89 U/L (ref 38–126)
Anion gap: 11 (ref 5–15)
BUN: 11 mg/dL (ref 6–20)
CO2: 22 mmol/L (ref 22–32)
Calcium: 9 mg/dL (ref 8.9–10.3)
Chloride: 112 mmol/L — ABNORMAL HIGH (ref 98–111)
Creatinine, Ser: 0.82 mg/dL (ref 0.61–1.24)
GFR calc Af Amer: >60 mL/min (ref >60)
GFR calc non Af Amer: >60 mL/min (ref >60)
Glucose, Bld: 119 mg/dL — ABNORMAL HIGH (ref 70–99)
Potassium: 3.3 mmol/L — ABNORMAL LOW (ref 3.5–5.1)
Sodium: 145 mmol/L (ref 135–145)
Total Bilirubin: 0.8 mg/dL (ref 0.3–1.2)
Total Protein: 6.3 g/dL — ABNORMAL LOW (ref 6.5–8.1)

## 2019-03-17 LAB — LIPID PANEL
Cholesterol: 146 mg/dL (ref 0–200)
HDL: 37 mg/dL — ABNORMAL LOW (ref >40)
LDL Cholesterol: 96 mg/dL (ref 0–99)
Total CHOL/HDL Ratio: 3.9 RATIO
Triglycerides: 66 mg/dL (ref <150)
VLDL: 13 mg/dL (ref 0–40)

## 2019-03-17 LAB — URINALYSIS, ROUTINE W REFLEX MICROSCOPIC
Bilirubin Urine: NEGATIVE
Glucose, UA: 150 mg/dL — AB
Hgb urine dipstick: NEGATIVE
Ketones, ur: NEGATIVE mg/dL
Leukocytes,Ua: NEGATIVE
Nitrite: NEGATIVE
Protein, ur: NEGATIVE mg/dL
Specific Gravity, Urine: 1.003 — ABNORMAL LOW (ref 1.005–1.030)
pH: 5 (ref 5.0–8.0)

## 2019-03-17 LAB — CBG MONITORING, ED
Glucose-Capillary: 169 mg/dL — ABNORMAL HIGH (ref 70–99)
Glucose-Capillary: 105 mg/dL — ABNORMAL HIGH (ref 70–99)
Glucose-Capillary: 102 mg/dL — ABNORMAL HIGH (ref 70–99)

## 2019-03-17 LAB — RAPID URINE DRUG SCREEN, HOSP PERFORMED
Amphetamines: NOT DETECTED
Barbiturates: NOT DETECTED
Benzodiazepines: POSITIVE — AB
Cocaine: NOT DETECTED
Opiates: NOT DETECTED
Tetrahydrocannabinol: POSITIVE — AB

## 2019-03-17 LAB — ETHANOL: Alcohol, Ethyl (B): <10 mg/dL (ref <10)

## 2019-03-17 LAB — GLUCOSE, CAPILLARY: Glucose-Capillary: 117 mg/dL — ABNORMAL HIGH (ref 70–99)

## 2019-03-17 LAB — SARS CORONAVIRUS 2 (TAT 6-24 HRS): SARS Coronavirus 2: NEGATIVE

## 2019-03-17 MED ORDER — LEVETIRACETAM IN NACL 1000 MG/100ML IV SOLN
1000.0000 mg | Freq: Once | INTRAVENOUS | Status: AC
  Administered 2019-03-17: 1000 mg via INTRAVENOUS
  Filled 2019-03-17: qty 100

## 2019-03-17 MED ORDER — ACETAMINOPHEN 325 MG PO TABS: 650.0000 mg | ORAL_TABLET | Freq: Four times a day (QID) | ORAL | Status: DC | PRN

## 2019-03-17 MED ORDER — CLOPIDOGREL BISULFATE 75 MG PO TABS
75.0000 mg | ORAL_TABLET | Freq: Every day | ORAL | Status: DC
  Administered 2019-03-17: 75 mg via ORAL
  Filled 2019-03-17: qty 1

## 2019-03-17 MED ORDER — LEVETIRACETAM IN NACL 500 MG/100ML IV SOLN
500.0000 mg | Freq: Two times a day (BID) | INTRAVENOUS | Status: DC
  Filled 2019-03-17 (×3): qty 100

## 2019-03-17 MED ORDER — ACETAMINOPHEN 650 MG RE SUPP: 650.0000 mg | Freq: Four times a day (QID) | RECTAL | Status: DC | PRN

## 2019-03-17 MED ORDER — SODIUM CHLORIDE 0.9 % IV SOLN: INTRAVENOUS | Status: DC

## 2019-03-17 MED ORDER — METOPROLOL TARTRATE 25 MG PO TABS
25.0000 mg | ORAL_TABLET | Freq: Two times a day (BID) | ORAL | Status: DC
  Administered 2019-03-18: 25 mg via ORAL
  Filled 2019-03-17: qty 1

## 2019-03-17 MED ORDER — INSULIN ASPART 100 UNIT/ML ~~LOC~~ SOLN
0.0000 [IU] | Freq: Three times a day (TID) | SUBCUTANEOUS | Status: DC
  Administered 2019-03-18: 2 [IU] via SUBCUTANEOUS

## 2019-03-17 MED ORDER — ENOXAPARIN SODIUM 40 MG/0.4ML ~~LOC~~ SOLN
40.0000 mg | SUBCUTANEOUS | Status: DC
  Administered 2019-03-17: 40 mg via SUBCUTANEOUS
  Filled 2019-03-17: qty 0.4

## 2019-03-17 MED ORDER — METOPROLOL TARTRATE 25 MG PO TABS: 25.0000 mg | ORAL_TABLET | Freq: Two times a day (BID) | ORAL | Status: DC

## 2019-03-17 MED ORDER — LEVETIRACETAM 500 MG PO TABS
500.0000 mg | ORAL_TABLET | Freq: Two times a day (BID) | ORAL | Status: DC
  Administered 2019-03-17 – 2019-03-18 (×2): 500 mg via ORAL
  Filled 2019-03-17 (×2): qty 1

## 2019-03-17 MED ORDER — SODIUM CHLORIDE 0.9 % IV BOLUS
1000.0000 mL | Freq: Once | INTRAVENOUS | Status: AC
  Administered 2019-03-17: 1000 mL via INTRAVENOUS

## 2019-03-17 MED ORDER — ATORVASTATIN CALCIUM 80 MG PO TABS
80.0000 mg | ORAL_TABLET | Freq: Every day | ORAL | Status: DC
  Administered 2019-03-17: 80 mg via ORAL
  Filled 2019-03-17: qty 1

## 2019-03-17 MED ORDER — LOSARTAN POTASSIUM 50 MG PO TABS
100.0000 mg | ORAL_TABLET | Freq: Every day | ORAL | Status: DC
  Administered 2019-03-18: 100 mg via ORAL
  Filled 2019-03-17: qty 2

## 2019-03-17 MED ORDER — INSULIN ASPART 100 UNIT/ML ~~LOC~~ SOLN: 0.0000 [IU] | Freq: Every day | SUBCUTANEOUS | Status: DC

## 2019-03-17 MED ORDER — LABETALOL HCL 5 MG/ML IV SOLN
5.0000 mg | Freq: Once | INTRAVENOUS | Status: AC
  Administered 2019-03-17: 5 mg via INTRAVENOUS
  Filled 2019-03-17: qty 4

## 2019-03-17 MED ORDER — LABETALOL HCL 5 MG/ML IV SOLN
5.0000 mg | INTRAVENOUS | Status: AC | PRN
  Administered 2019-03-17: 5 mg via INTRAVENOUS
  Filled 2019-03-17: qty 4

## 2019-03-17 NOTE — Consult Note (Addendum)
Neurology Consultation Reason for Consult: Seizures Referring Physician: Ward, K  CC: Seizures  History is obtained from: Patient  HPI: Todd Hood is a 44 y.o. male with a previous stroke felt to be cryptogenic, embolic from unknown source which is resulted in left-sided weakness.  This morning, he had 2 seizures.  The first was witnessed by his girlfriend prompting her to call EMS.  It lasted for approximately 5 minutes, and she noticed that he had blood coming from his mouth.  On EMS arrival, the patient had another seizure lasting approximately 1.5 minutes, he was given 2.5 mg of Versed with resolution of the shaking.  On arrival, he was persistently encephalopathic.  On arrival, he was no longer seizing, but was nonverbal.  He was loaded with Keppra, and was not until after the Keppra load that he began becoming more responsive and interactive.  ROS: A 14 point ROS was performed and is negative except as noted in the HPI.   Past Medical History:  Diagnosis Date  . CVA (cerebral vascular accident) (HCC)   . Hypertension      Family History  Problem Relation Age of Onset  . Diabetes Mother   . Diabetes Father   . Sickle cell trait Father   . Stroke Father      Social History:  reports that he has quit smoking. He smoked 0.25 packs per day. He has never used smokeless tobacco. He reports current alcohol use. He reports current drug use. Drug: Marijuana.   Exam: Current vital signs: BP (!) 128/98   Pulse (!) 104   Temp 97.8 F (36.6 C) (Axillary)   Resp (!) 22   SpO2 95%  Vital signs in last 24 hours: Temp:  [97.8 F (36.6 C)] 97.8 F (36.6 C) (03/05 0505) Pulse Rate:  [104-127] 104 (03/05 0530) Resp:  [20-22] 22 (03/05 0530) BP: (128-134)/(95-99) 128/98 (03/05 0530) SpO2:  [95 %-96 %] 95 % (03/05 0530)   Physical Exam  Constitutional: Appears well-developed and well-nourished.  Psych: Affect appropriate to situation Eyes: No scleral injection HENT: No OP  obstrucion MSK: no joint deformities.  Cardiovascular: Normal rate and regular rhythm.  Respiratory: Effort normal, non-labored breathing GI: Soft.  No distension. There is no tenderness.  Skin: WDI  Neuro: Mental Status: Patient is awake, alert, oriented to person, place, unable to give month Patient is able to give a clear and coherent history. No signs of aphasia or neglect Cranial Nerves: II: Dense left hemianopia pupils are equal, round, and reactive to light.   III,IV, VI: Disconjugate eyes with impaired abduction of the left and slight right hypometria.  He has incomplete left gaze in both eyes. V: Facial sensation is symmetric to light touch VII: Facial movement is symmetric.  Motor: He has 4/5 weakness left arm and leg, 5/5 on the right Sensory: Sensation is symmetric to light touch in the arms and legs. Cerebellar: No clear ataxia   I have reviewed labs in epic and the results pertinent to this consultation are: Sodium 146, potassium 3.0, creatinine 0.6  I have reviewed the images obtained: CT head-interval stroke in the right pons, unclear timing  Impression: 44 year old with new onset seizures in the setting of previous strokes.  He will need to be started on antiepileptic therapy.  The pontine stroke I feel is of unclear chronicity.  Given the time it took him to improve, I think it there is a very strong possibility that he was in status epilepticus on arrival, but  this appears to have resolved at this point.  Recommendations: 1) MRI brain 2) EEG 3) Keppra 500 mg twice daily   Roland Rack, MD [facility] [phone]  If 7pm- 7am, please page neurology on call as listed in [place].

## 2019-03-17 NOTE — ED Notes (Signed)
The pt is alert but slow to respond  Does not know the day the month or the month  He does know the year and the current president

## 2019-03-17 NOTE — Progress Notes (Signed)
EEG completed, results pending. 

## 2019-03-17 NOTE — ED Notes (Signed)
Pt transported to MRI 

## 2019-03-17 NOTE — ED Notes (Signed)
The pt still is not eating  When I asked him again  About eating he reports  That he is sleepy  Just looks at  The food

## 2019-03-17 NOTE — ED Triage Notes (Signed)
Patient arrived from home with a chief complaint of seizures that started around 0330. Pt had a seizure for 5 minutes witnessed by the family who called EMS. En route, the patient had another seizure at 1 minute and another at 30 seconds, full tonic-clonic. 2.5 versed given en route. EMS initiated 2 IVs. EMS VSS

## 2019-03-17 NOTE — ED Notes (Signed)
This RN and phlebotomist attempted to obtain pt blood work. Both unable to do so.

## 2019-03-17 NOTE — ED Provider Notes (Addendum)
TIME SEEN: 5:21 AM  CHIEF COMPLAINT: Seizure  HPI: Patient is a 44 year old male with history of multiple prior CVAs, hypertension, previous seizure August 11, 2018 who presents to the emergency department with EMS for witnessed seizure at home.  Patient seizure was witnessed by his girlfriend 39.  His girlfriend works as a Lawyer.  She states that he was lying in bed when he had a generalized tonic-clonic seizure that lasted approximately 5 to 6 minutes.  He did have blood coming from his mouth.  Was postictal afterwards.  With EMS, patient had another seizure and was given 2.5mg  of Versed.  Postictal currently.  Has right NPA in place.  Blood glucose normal with EMS.  Patient significant other reports no recent fevers, cough, vomiting or diarrhea.  She reports he is on losartan and metoprolol for blood pressure, glipizide and Metformin for diabetes, atorvastatin.  He is also on aspirin and Plavix.  She reports his blood pressure has been elevated recently but he has not missed any doses.  She is not aware of any medication changes.  No recent head injury.  He has not had any recent complaints.  Reports one seizure in July 2020 but was not placed on seizure medications.  States he has had a CVA in February 2020 and again in August 2020.  Kirstie Mirza - girlfriend - [phone] Colman Cater - mother - [phone]  PCP - Eulis Manly with [facility] - Dr. Stacy Gardner with [facility]  ROS: Level 5 caveat due to altered mental status  PAST MEDICAL HISTORY/PAST SURGICAL HISTORY:  Past Medical History:  Diagnosis Date  . CVA (cerebral vascular accident) (HCC)   . Hypertension     MEDICATIONS:  Prior to Admission medications   Medication Sig Start Date End Date Taking? Authorizing Provider  acetaminophen (TYLENOL) 650 MG CR tablet Take 650 mg by mouth every 8 (eight) hours as needed for pain.    [provider]  atorvastatin (LIPITOR) 80 MG tablet Take 1 tablet (80 mg total) by mouth  at bedtime. 09/14/18   Anders Simmonds, PA-C  cloNIDine (CATAPRES - DOSED IN MG/24 HR) 0.1 mg/24hr patch Place 1 patch (0.1 mg total) onto the skin every Monday. 09/19/18   Anders Simmonds, PA-C  clopidogrel (PLAVIX) 75 MG tablet TAKE 1 TABLET BY MOUTH AT BEDTIME 12/21/18   Georgian Co M, PA-C  docusate sodium (COLACE) 100 MG capsule Take 1 capsule (100 mg total) by mouth 2 (two) times daily. 09/07/18   Love, Evlyn Kanner, PA-C  metoprolol tartrate (LOPRESSOR) 25 MG tablet Take 1 tablet (25 mg total) by mouth 2 (two) times daily. 09/14/18   Anders Simmonds, PA-C  Multiple Vitamins-Minerals (CENTRUM SILVER 50+MEN) TABS Take 1 tablet by mouth daily with breakfast.    [provider]  polyethylene glycol (MIRALAX / GLYCOLAX) 17 g packet Take 17 g by mouth 2 (two) times daily. 09/07/18   Love, Evlyn Kanner, PA-C  senna-docusate (SENOKOT-S) 8.6-50 MG tablet Take 2 tablets by mouth at bedtime. 09/07/18   Jacquelynn Cree, PA-C    ALLERGIES:  No Known Allergies  SOCIAL HISTORY:  Social History   Tobacco Use  . Smoking status: Former Smoker    Packs/day: 0.25  . Smokeless tobacco: Never Used  . Tobacco comment: stopped 2006-2007  Substance Use Topics  . Alcohol use: Yes    Comment: social rare    FAMILY HISTORY: Family History  Problem Relation Age of Onset  . Diabetes Mother   .  Diabetes Father   . Sickle cell trait Father   . Stroke Father     EXAM: BP (!) 133/95 (BP Location: Right Arm)   Pulse (!) 117   Temp 97.8 F (36.6 C) (Axillary)   Resp (!) 22   SpO2 95%  CONSTITUTIONAL: Patient will open eyes to voice.  He does not answer questions or follow commands.  He does move his extremities equally. HEAD: Normocephalic, atraumatic EYES: Conjunctivae clear, pupils appear equal, EOM appear intact ENT: normal nose; moist mucous membranes, abrasion to the tongue but no laceration, no dental injury, no septal hematoma NECK: Supple, normal ROM, no midline spinal tenderness or step-off  or deformity, c-collar in place CARD: Regular and tachycardic; S1 and S2 appreciated; no murmurs, no clicks, no rubs, no gallops RESP: Normal chest excursion without splinting or tachypnea; breath sounds clear and equal bilaterally; no wheezes, no rhonchi, no rales, no hypoxia or respiratory distress, speaking full sentences ABD/GI: Normal bowel sounds; non-distended; soft, non-tender, no rebound, no guarding, no peritoneal signs, no hepatosplenomegaly BACK:  The back appears normal and is nontender, no spinal tenderness or step-off or deformity EXT: Normal ROM in all joints; no deformity noted, no edema; no cyanosis SKIN: Normal color for age and race; warm; no rash on exposed skin NEURO: Moves all extremities equally, no noted facial asymmetry, patient does not answer questions or follow commands at this time, postictal   MEDICAL DECISION MAKING: Patient here with 2 seizures.  Has had history of multiple previous strokes.  Family concerned about his blood pressure.  Blood pressures have been in the 130s/90s. PRES is in the differential as is intracranial hemorrhage, recurrent stroke.  No family history of epilepsy.  Blood glucose normal.  Will obtain labs, urine and head CT.  Will give 1 g of IV Keppra.  Anticipate admission.  ED PROGRESS: Patient still not back to baseline.  CT head shows what appears to be a subacute infarct of the right pons.  No intracranial hemorrhage.  Will discuss with neurology on-call.   6:45 AM  D/w Dr. Leonel Ramsay on-call for neurology.  Appreciate his help.  He recommends giving a total of 2 g of IV Keppra.  Agrees with medicine admission.  He is reviewed patient's head CT imaging which shows possible subacute infarct in the right pons.  He recommends MRI of the brain without contrast.  He will see patient in consultation.  Labs pending.  Will admit to medicine once labs have resulted.  Updated patient's girlfriend and mother by phone.    7:34 AM Discussed patient's  case with IM teaching service, Dr. Shan Levans.  I have recommended admission and patient (and family if present) agree with this plan. Admitting physician will place admission orders.   I reviewed all nursing notes, vitals, pertinent previous records and interpreted all EKGs, lab and urine results, imaging (as available).     EKG Interpretation  Date/Time:  Friday March 17 2019 05:07:19 EST Ventricular Rate:  116 PR Interval:    QRS Duration: 86 QT Interval:  341 QTC Calculation: 474 R Axis:   62 Text Interpretation: Sinus tachycardia Baseline wander in lead(s) V2 Rate faster than previous Confirmed by Jozalyn Baglio, Cyril Mourning [id number]) on 03/17/2019 5:21:10 AM       CRITICAL CARE Performed by: Cyril Mourning Temiloluwa [place]o   Total critical care time: 45 minutes  Critical care time was exclusive of separately billable procedures and treating other patients.  Critical care was necessary to treat or prevent imminent or life-threatening deterioration.  Critical care was time spent personally by me on the following activities: development of treatment plan with patient and/or surrogate as well as nursing, discussions with consultants, evaluation of patient's response to treatment, examination of patient, obtaining history from patient or surrogate, ordering and performing treatments and interventions, ordering and review of laboratory studies, ordering and review of radiographic studies, pulse oximetry and re-evaluation of patient's condition.   Plato Alspaugh was evaluated in Emergency Department on 03/17/2019 for the symptoms described in the history of present illness. He was evaluated in the context of the global COVID-19 pandemic, which necessitated consideration that the patient might be at risk for infection with the SARS-CoV-2 virus that causes COVID-19. Institutional protocols and algorithms that pertain to the evaluation of patients at risk for COVID-19 are in a state of rapid change based on information released  by regulatory bodies including the CDC and federal and state organizations. These policies and algorithms were followed during the patient's care in the ED.  Patient was seen wearing N95, face shield, gloves.         Tanecia Mccay, Layla Maw, DO 03/17/19 [phone]

## 2019-03-17 NOTE — ED Notes (Signed)
Report given to rn on 2w 

## 2019-03-17 NOTE — Progress Notes (Signed)
Interim note  Reviewed EEG: Continuous slow, generalized and lateralized right hemisphere,  No seizures or epileptiform discharges were seen throughout the recording.  MRI Arlys John: Did not show acute stroke, chronic thalamic infarct as well as right PCA infarct.

## 2019-03-17 NOTE — Procedures (Signed)
Patient Name: Todd Hood  MRN: [id number]  Epilepsy Attending: Charlsie Quest  Referring Physician/Provider: Dr. Chana Bode Date: 03/17/2019 Duration: 24.17 minutes  Patient history: 44 year old male with new onset seizures in setting of previous stroke.  EEG to evaluate for seizures.  Level of alertness: Awake/lethargic  AEDs during EEG study: Keppra  Technical aspects: This EEG study was done with scalp electrodes positioned according to the 10-20 International system of electrode placement. Electrical activity was acquired at a sampling rate of 500Hz  and reviewed with a high frequency filter of 70Hz  and a low frequency filter of 1Hz . EEG data were recorded continuously and digitally stored.   Description: EEG showed continuous generalized 3-7 hertz theta-delta slowing in right hemisphere and 2 to 3 Hz rhythmic delta slowing in left hemisphere.  No clear posterior dominant rhythm was seen.  Hyperventilation and photic stimulation were not performed.  Abnormality -Continuous slow, generalized and lateralized right hemisphere  IMPRESSION: This study is suggestive of cortical dysfunction in the right hemisphere likely secondary to underlying infarct, postictal state.  Additionally, there is evidence of mild diffuse encephalopathy, nonspecific to etiology.  No seizures or epileptiform discharges were seen throughout the recording.    Areli Jowett 

## 2019-03-17 NOTE — ED Notes (Signed)
Attempted to do pt swallow screen, pt too drowsy to do screening. RN will attempt in a little while.

## 2019-03-17 NOTE — H&P (Signed)
Date: 03/17/2019               Patient Name:  Todd Hood MRN: [id number]  DOB: 1975/09/26 Age / Sex: 44 y.o., male   PCP: Patient, No Pcp Per         Medical Service: Internal Medicine Teaching Service         Attending Physician: Dr. Reymundo Poll, MD    First Contact: Marchia Bond, DO, Sharlet Salina Pager: [facility] [phone])  Second Contact: Dortha Schwalbe, MD, Obed Pager: [facility] [phone])       After Hours (After 5p/  First Contact Pager: [phone]  weekends / holidays): Second Contact Pager: [phone]   Chief Complaint: seizure  History of Present Illness: 44 y.o. 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 His PCP and neurologist is in the [facility] system, he has had no prior history of seizure. Level 5 caveat Pt still confused and postictal not sure how he got here or why he's here but answering questions appropriately.  History obtained from chart review, discussions with ED provider and pt's significant other.  Pt in his normal state of health no changes in routing other than starting physical therapy and recently started on Jardiance for his diabetes.  He had two witnessed seizures occurring at home, first one witnessed by his girlfriend with convulsions and tongue biting prompting EMS call, on their arrival pt began having another seizure which was aborted with versed 2.5mg  after 1.5 minutes.  He was loaded with IV keppra on arrival and evaluated by neurology.  Initial workup significant for subacute right pontine infarct seen on CT..     Meds:  No outpatient medications have been marked as taking for the 03/17/19 encounter [facility] Encounter).     Allergies: Allergies as of 03/17/2019  . (No Known Allergies)   Past Medical History:  Diagnosis Date  . CVA (cerebral vascular accident) (HCC)   . Hypertension     Family History:  Family History  Problem Relation Age of Onset  . Diabetes Mother   . Diabetes Father   .  Sickle cell trait Father   . Stroke Father    Stroke in father's 2 brothers as well  Social History:  Social History   Tobacco Use  . Smoking status: Former Smoker    Packs/day: 0.25  . Smokeless tobacco: Never Used  . Tobacco comment: stopped 2006-2007  Substance Use Topics  . Alcohol use: Yes    Comment: social rare  . Drug use: Yes    Types: Marijuana    Comment: last used yesterday     Review of Systems: A complete ROS was negative except as per HPI.   Physical Exam: Blood pressure (!) 128/98, pulse (!) 104, temperature 97.8 F (36.6 C), temperature source Axillary, resp. rate (!) 22, SpO2 95 %. Physical Exam Constitutional:      General: He is sleeping.     Appearance: He is not diaphoretic.  HENT:     Head: Normocephalic and atraumatic.     Mouth/Throat:     Comments: Tongue swollen Eyes:     General: No scleral icterus.       Right eye: No discharge.        Left eye: No discharge.  Cardiovascular:     Rate and Rhythm: Normal rate and regular rhythm.     Heart sounds: Normal heart sounds. No murmur. No friction rub. No gallop.   Pulmonary:  Effort: Pulmonary effort is normal. No respiratory distress.     Breath sounds: Normal breath sounds. No wheezing or rales.  Abdominal:     General: Bowel sounds are normal. There is no distension.     Palpations: Abdomen is soft. There is no mass.     Tenderness: There is no abdominal tenderness. There is no guarding.  Skin:    General: Skin is warm and dry.  Neurological:     Comments: II: Visual fields intact;  PERRL. III,IV, FF:MBWGYK not present,EOMI without nystagmus V,VII:No facial droop. Facial tempsensation equalbilaterally VIII: hearingintact to voice IX,X:Palaterises symmetrically ZL:[id number] shrug XII: midline tongue extension Motor: RUE and RLE 5/5 LUE 4/5 LLE 4/5 Sensory:Tempand light touch intact x 4 Gait:Deferred        EKG: personally reviewed my interpretation  is sinus tachycardia, poor R wave progression  CXR: personally reviewed my interpretation is none available   Assessment & Plan by Problem: Active Problems:   Seizure ([place])  New onset Seizures: new onset, no known trigger, does have hx of prior CVA and imaging shows he has had another cva in the pons since his last scan  -FU MRI -EEG -Keppra 500mg  BID  Hx of CVA: with residual left sided weakness, on plavix at home aspirin d/ced by Neurologist Dr. Bobby Rumpf after 3 wks, Hypercoagulable workup, TCD bubble study and echo 7 months prior unremarkable he declined loop recorder due to cost.  He has a new subacute stroke seen on CT head this admission.  Strong family history of CVA on father's side.    -MRI -neurology following -continuing plavix for now complex situation will defer regimen going forward to neurology  -repeat lipid panel and A1C -continue atorvastatin 80 -revisit loop recorder insertion? -bp control  T2DM: on jardiance, glipizide and metformin at home  -repeat A1C -SSI likely add lantus this evening  Dispo: Admit patient to Observation with expected length of stay less than 2 midnights.  Signed: Katherine Roan, MD 03/17/2019, 8:07 AM

## 2019-03-17 NOTE — ED Notes (Signed)
Pt reports to be hungry but is not attempting to eat  hre has been asked several times if hes hungry  And he reports that he is   No attempts to eat

## 2019-03-17 NOTE — ED Notes (Signed)
The pt is alert  Lab is attempting to  sraw a pt   Not enough blood maybe another stick  To get the blood   Admitting doctor has called on the phone to ask about him

## 2019-03-17 NOTE — ED Notes (Signed)
Patient has a history of stroke with left-sided deficits.

## 2019-03-18 DIAGNOSIS — R569 Unspecified convulsions: Secondary | ICD-10-CM | POA: Diagnosis not present

## 2019-03-18 LAB — COMPREHENSIVE METABOLIC PANEL
ALT: 20 U/L (ref 0–44)
AST: 19 U/L (ref 15–41)
Albumin: 3.8 g/dL (ref 3.5–5.0)
Alkaline Phosphatase: 96 U/L (ref 38–126)
Anion gap: 13 (ref 5–15)
BUN: 8 mg/dL (ref 6–20)
CO2: 24 mmol/L (ref 22–32)
Calcium: 9.1 mg/dL (ref 8.9–10.3)
Chloride: 104 mmol/L (ref 98–111)
Creatinine, Ser: 0.83 mg/dL (ref 0.61–1.24)
GFR calc Af Amer: >60 mL/min (ref >60)
GFR calc non Af Amer: >60 mL/min (ref >60)
Glucose, Bld: 141 mg/dL — ABNORMAL HIGH (ref 70–99)
Potassium: 2.8 mmol/L — ABNORMAL LOW (ref 3.5–5.1)
Sodium: 141 mmol/L (ref 135–145)
Total Bilirubin: 1.6 mg/dL — ABNORMAL HIGH (ref 0.3–1.2)
Total Protein: 6.8 g/dL (ref 6.5–8.1)

## 2019-03-18 LAB — CBC
HCT: 43.2 % (ref 39.0–52.0)
Hemoglobin: 15.4 g/dL (ref 13.0–17.0)
MCH: 30.4 pg (ref 26.0–34.0)
MCHC: 35.6 g/dL (ref 30.0–36.0)
MCV: 85.2 fL (ref 80.0–100.0)
Platelets: 224 10*3/uL (ref 150–400)
RBC: 5.07 MIL/uL (ref 4.22–5.81)
RDW: 13.2 % (ref 11.5–15.5)
WBC: 8.7 10*3/uL (ref 4.0–10.5)
nRBC: 0 % (ref 0.0–0.2)

## 2019-03-18 LAB — GLUCOSE, CAPILLARY
Glucose-Capillary: 127 mg/dL — ABNORMAL HIGH (ref 70–99)
Glucose-Capillary: 120 mg/dL — ABNORMAL HIGH (ref 70–99)
Glucose-Capillary: 155 mg/dL — ABNORMAL HIGH (ref 70–99)

## 2019-03-18 LAB — HIV ANTIBODY (ROUTINE TESTING W REFLEX): HIV Screen 4th Generation wRfx: NONREACTIVE

## 2019-03-18 MED ORDER — LEVETIRACETAM 500 MG PO TABS: 500.0000 mg | ORAL_TABLET | Freq: Two times a day (BID) | ORAL | 2 refills | Status: AC

## 2019-03-18 MED ORDER — POTASSIUM CHLORIDE 20 MEQ PO PACK
20.0000 meq | PACK | Freq: Two times a day (BID) | ORAL | Status: DC
  Administered 2019-03-18: 20 meq via ORAL
  Filled 2019-03-18 (×2): qty 1

## 2019-03-18 MED ORDER — MENTHOL 3 MG MT LOZG
1.0000 | LOZENGE | OROMUCOSAL | Status: DC | PRN
  Administered 2019-03-18: 3 mg via ORAL
  Filled 2019-03-18: qty 9

## 2019-03-18 NOTE — Progress Notes (Signed)
Subjective: HD#0 Events Overnight: No overnight events  Patient was seen this morning on rounds.  Patient resting comfortably in bed.  Patient states that he does not have any acute symptoms at this time.  He feels more alert and oriented today than he did when he first arrived to the hospital.  Denies any chest pain and shortness of breath, any headache, confusion, bowel or bladder dysfunction, changes in strength or sensation.  Objective:  Vital signs in last 24 hours: Vitals:   03/17/19 1823 03/17/19 1830 03/17/19 1857 03/17/19 2140  BP:  (!) 174/113 (!) 164/109 (!) 149/130  Pulse:  90 86 92  Resp:  17 18 18   Temp: 99.9 F (37.7 C)  98.7 F (37.1 C) 98.8 F (37.1 C)  TempSrc:   Oral Oral  SpO2:  98% 98% 100%   Supplemental O2: None  Physical Exam: Physical Exam  Constitutional: He is oriented to person, place, and time. No distress.  HENT:  Head: Normocephalic and atraumatic.  Eyes: Pupils are equal, round, and reactive to light. EOM are normal.  Cardiovascular: Normal rate and intact distal pulses.  Pulmonary/Chest: Effort normal and breath sounds normal.  Abdominal: Soft. He exhibits no distension. There is no abdominal tenderness.  Musculoskeletal:        General: No tenderness or edema.     Cervical back: Normal range of motion.  Neurological: He is alert and oriented to person, place, and time. He is not disoriented. He displays facial asymmetry and abnormal speech. No cranial nerve deficit or sensory deficit. He has a normal Romberg Test. Coordination normal.  Patient has left-sided hemiparesis consistent with his symptoms from his previous stroke.  Left upper extremity is 3 out of 5 strength in lower extremities roughly 2-3 out of 5.  He is to have a left-sided facial asymmetry.  No obvious cranial nerve deficits.  Maintains sensation within normal limits  Skin: He is not diaphoretic.    There were no vitals filed for this visit.   Intake/Output Summary (Last  24 hours) at 03/18/2019 0545 Last data filed at 03/18/2019 0409 Gross per 24 hour  Intake 1090.72 ml  Output 200 ml  Net 890.72 ml    Risk Score:  N/A  Pertinent labs/Imaging: CBC Latest Ref Rng & Units 03/17/2019 10/20/2018 09/05/2018  WBC 4.0 - 10.5 K/uL - 6.6 5.8  Hemoglobin 13.0 - 17.0 g/dL 12.9(L) 16.5 14.3  Hematocrit 39.0 - 52.0 % 38.0(L) 44.1 38.9(L)  Platelets 150 - 400 K/uL - 231 248    CMP Latest Ref Rng & Units 03/17/2019 03/17/2019 10/20/2018  Glucose 70 - 99 mg/dL 12/20/2018) 712(W) 580(D)  BUN 6 - 20 mg/dL 11 16 17   Creatinine 0.61 - 1.24 mg/dL 983(J ) 8.25  Sodium 135 - 145 mmol/L 145 146(H) 132(L)  Potassium 3.5 - 5.1 mmol/L 3.3(L) 3.0(L) 3.7  Chloride 98 - 111 mmol/L 112(H) 114(H) 100  CO2 22 - 32 mmol/L 22 - 18(L)  Calcium 8.9 - 10.3 mg/dL 9.0 - 9.4  Total Protein 6.5 - 8.1 g/dL 6.3(L) - -  Total Bilirubin 0.3 - 1.2 mg/dL 0.8 - -  Alkaline Phos 38 - 126 U/L 89 - -  AST 15 - 41 U/L 19 - -  ALT 0 - 44 U/L 17 - -   CT Head: 1. Subacute infarct involving the right pons.  2. Prior lacunar infarcts as described above.  3. There is dilatation the ventricles and sulci consistent with age-related atrophy. Low-attenuation changes in the  deep white matter consistent with small vessel ischemia.  4.  No acute fracture or malalignment of the spine.  MR Brain: 1. Subacute infarct involving the right pons. 2. Prior lacunar infarcts as described above.  3. There is dilatation the ventricles and sulci consistent with age-related atrophy. Low-attenuation changes in the deep white matter consistent with small vessel ischemia.  4.  No acute fracture or malalignment of the spine.  EEG: This study is suggestive of cortical dysfunction in the right hemisphere likely secondary to underlying infarct, postictal state.  Additionally, there is evidence of mild diffuse encephalopathy, nonspecific to etiology.  No seizures or epileptiform discharges were seen throughout the  recording   Assessment/Plan:  Active Problems:   Seizure [facility])    Patient Summary: Todd Hood is a 44 y.o. with pertinent PMH of cryptogenic embolic CVA with residual left-sided weakness, hypertension, type 2 diabetes, who presented with witnessed seizure-like activity x2 and admit for observation in the setting of new onset seizure-like activity in the setting of subacute right pontine infarct on CT and history of cryptogenic stroke. On hospital day 0  #Seizure: Patient was admitted yesterday with 2 episodes of seizure-like activity aborted with 2.5 mg of Versed, patient loaded with IV Keppra on arrival.  EEG shows no evidence of epileptiform activity ruling out ongoing seizure/status epilepticus.  -Continue AED medications per neurology -Appreciate neurology is assistance.  #Subacute CVA #History of CVA CT/MRI imaging on presentation shows subacute right pontine infarct with prior lacunar infarcts.  Patient on atorvastatin 80 mg and clopidogrel 75 mg. -Continue antiplatelet therapy and high-intensity statin therapy  #Type 2 diabetes mellitus: Glucose on presentation was 117, patient on SSI. A1c pending   #HTN:  Blood pressures still elevated. Patient is on losartan 100 mg daily metoprolol 25 mg twice daily.  Follow-up as needed for blood pressures over 767 systolic  Diet: Carb-Modified IVF: None,None VTE: Enoxaparin Code: Full PT/OT recs: None TOC recs: None   Dispo: Anticipated discharge home today.    Marianna Payment, D.O. [facility], PGY-1 Date 03/18/2019 Time 5:45 AM

## 2019-03-18 NOTE — Progress Notes (Signed)
Paged on call for a potassium of 2.8.

## 2019-03-18 NOTE — Plan of Care (Signed)

## 2019-03-18 NOTE — Progress Notes (Addendum)
NEURO HOSPITALIST PROGRESS NOTE   Subjective: Patient awake, alert, NAD sitting up in chair. Spoke with patient and family. They verbalized an understanding of the plan. Patient to continue Keppra 500 mg BID and f/u outpatient neurology.  Exam: Vitals:   03/17/19 2140 03/18/19 0831  BP: (!) 149/130 (!) 156/106  Pulse: 92 79  Resp: 18 18  Temp: 98.8 F (37.1 C) 99 F (37.2 C)  SpO2: 100% 100%    Physical Exam  Constitutional: Appears well-developed and well-nourished.  Psych: Affect appropriate to situation Eyes: Normal external eye and conjunctiva. HENT: Normocephalic, no lesions, without obvious abnormality.   Musculoskeletal-no joint tenderness, deformity or swelling Cardiovascular: Normal rate and regular rhythm.  Respiratory: Effort normal, non-labored breathing saturations WNL GI: Soft.  No distension. There is no tenderness.  Skin: WDI   Neuro:  Mental Status: Alert, oriented, thought content appropriate.  Speech fluent without evidence of aphasia.  Able to follow 3 step commands without difficulty. Cranial Nerves: II:  VFF III,IV, VI: ptosis not present, disconjugate gaze, eyes do not appear to go all the way to the left, bilaterally pupils equal, round, reactive to light and accommodation V,VII: smile symmetric, facial light touch sensation normal bilaterally VIII: hearing normal bilaterally IX,X: uvula rises symmetrically XI: bilateral shoulder shrug XII: midline tongue extension Motor: Right : Upper extremity   5/5 Left:     Upper extremity   4/5  Lower extremity   5/5  Lower extremity   4/5 Tone and bulk:normal tone throughout; no atrophy noted Sensory: light touch intact throughout, bilaterally Deep Tendon Reflexes: 2+ and symmetric biceps, patella Cerebellar: No ataxia noted Gait: deferred    Medications:  Scheduled:  atorvastatin  80 mg Oral QHS   clopidogrel  75 mg Oral QHS   enoxaparin (LOVENOX) injection  40 mg Subcutaneous  Q24H   insulin aspart  0-15 Units Subcutaneous TID WC   insulin aspart  0-5 Units Subcutaneous QHS   levETIRAcetam  500 mg Oral BID   losartan  100 mg Oral Daily   metoprolol tartrate  25 mg Oral BID   potassium chloride  20 mEq Oral BID   Continuous:  levETIRAcetam     PJK:[id number] **OR** acetaminophen, menthol-cetylpyridinium  Pertinent Labs/Diagnostics:   CT HEAD WO CONTRAST  Result Date: 03/17/2019 CLINICAL DATA:  New onset seizures EXAM: CT HEAD WITHOUT CONTRAST TECHNIQUE: Contiguous axial images were obtained from the base of the skull through the vertex without intravenous contrast. COMPARISON:  August 16, 2018 FINDINGS: Brain: Again noted is large area of encephalomalacia involving the right occipital lobe, right temporal lobe. Prior infarcts are seen within the right thalamus right basal ganglia and right mid brain. New since the prior exam is a focal area of hypodensity within the right pons. There is dilatation the ventricles and sulci consistent with age-related atrophy. Low-attenuation changes in the deep white matter consistent with small vessel ischemia. Vascular: No hyperdense vessel or unexpected calcification. Skull: The skull is intact. No fracture or focal lesion identified. Sinuses/Orbits: The visualized paranasal sinuses and mastoid air cells are clear. The orbits and globes intact. Other: None Cervical spine: Alignment: Slight reversal of the normal cervical lordosis. Skull base and vertebrae: Visualized skull base is intact. No atlanto-occipital dissociation. The vertebral body heights are well maintained. No fracture or pathologic osseous lesion seen. Soft tissues and spinal canal: The visualized paraspinal soft tissues are  unremarkable. No prevertebral soft tissue swelling is seen. The spinal canal is grossly unremarkable, no large epidural collection or significant canal narrowing. Disc levels:    No significant canal or neural foraminal narrowing. Upper chest: The  lung apices are clear. Thoracic inlet is within normal limits. Other: None IMPRESSION: 1. Subacute infarct involving the right pons. 2. Prior lacunar infarcts as described above. 3. There is dilatation the ventricles and sulci consistent with age-related atrophy. Low-attenuation changes in the deep white matter consistent with small vessel ischemia. 4.  No acute fracture or malalignment of the spine. Electronically Signed   By: Jonna Clark M.D.   On: 03/17/2019 06:21   CT CERVICAL SPINE WO CONTRAST  Result Date: 03/17/2019 CLINICAL DATA:  New onset seizures EXAM: CT HEAD WITHOUT CONTRAST TECHNIQUE: Contiguous axial images were obtained from the base of the skull through the vertex without intravenous contrast. COMPARISON:  August 16, 2018 FINDINGS: Brain: Again noted is large area of encephalomalacia involving the right occipital lobe, right temporal lobe. Prior infarcts are seen within the right thalamus right basal ganglia and right mid brain. New since the prior exam is a focal area of hypodensity within the right pons. There is dilatation the ventricles and sulci consistent with age-related atrophy. Low-attenuation changes in the deep white matter consistent with small vessel ischemia. Vascular: No hyperdense vessel or unexpected calcification. Skull: The skull is intact. No fracture or focal lesion identified. Sinuses/Orbits: The visualized paranasal sinuses and mastoid air cells are clear. The orbits and globes intact. Other: None Cervical spine: Alignment: Slight reversal of the normal cervical lordosis. Skull base and vertebrae: Visualized skull base is intact. No atlanto-occipital dissociation. The vertebral body heights are well maintained. No fracture or pathologic osseous lesion seen. Soft tissues and spinal canal: The visualized paraspinal soft tissues are unremarkable. No prevertebral soft tissue swelling is seen. The spinal canal is grossly unremarkable, no large epidural collection or significant  canal narrowing. Disc levels:    No significant canal or neural foraminal narrowing. Upper chest: The lung apices are clear. Thoracic inlet is within normal limits. Other: None IMPRESSION: 1. Subacute infarct involving the right pons. 2. Prior lacunar infarcts as described above. 3. There is dilatation the ventricles and sulci consistent with age-related atrophy. Low-attenuation changes in the deep white matter consistent with small vessel ischemia. 4.  No acute fracture or malalignment of the spine. Electronically Signed   By: Jonna Clark M.D.   On: 03/17/2019 06:21   MR BRAIN WO CONTRAST  Result Date: 03/17/2019 CLINICAL DATA:  Altered mental status. EXAM: MRI HEAD WITHOUT CONTRAST TECHNIQUE: Multiplanar, multiecho pulse sequences of the brain and surrou a nding structures were obtained without intravenous contrast. COMPARISON:  Head CT March 17, 2019; MRI of the brain August 14, 2018. FINDINGS: The study is degraded by motion. Brain: No acute infarction, hemorrhage, hydrocephalus, extra-axial collection or mass lesion. Again seen large area of encephalomalacia and gliosis involving the right occipital lobe extending to the medial right temporal lobe and thalamus, consistent with right PCA territory infarct. There are also remote infarct in the medial right thalamus extending into the right cerebral peduncle and right side of the pons. A few scattered foci of T2 hyperintensity are seen within the white matter of the cerebral hemispheres, nonspecific, most likely related to chronic small vessel ischemia. Vascular: Dolichoectasia of the basilar artery compressing on the inferior aspect of the third ventricle/hypothalamus. Skull and upper cervical spine: Normal marrow signal. Sinuses/Orbits: Mild mucosal thickening of ethmoid  cells. Other: None. IMPRESSION: 1. No acute intracranial abnormality. 2. Chronic right PCA territory infarct. 3. Chronic infarct in the medial right thalamus extending into the right cerebral  peduncle and right side of the pons. 4. Mild chronic small vessel ischemia. Electronically Signed   By: Pedro Earls M.D.   On: 03/17/2019 09:43   EEG adult  Result Date: 03/17/2019 Lora Havens, MD     03/17/2019  2:12 PM Patient Name: Todd Hood MRN: [id number] Epilepsy Attending: Lora Havens Referring Physician/Provider: Dr. Guadlupe Spanish Date: 03/17/2019 Duration: 24.17 minutes Patient history: 44 year old male with new onset seizures in setting of previous stroke.  EEG to evaluate for seizures. Level of alertness: Awake/lethargic AEDs during EEG study: Keppra Technical aspects: This EEG study was done with scalp electrodes positioned according to the 10-20 International system of electrode placement. Electrical activity was acquired at a sampling rate of 500Hz  and reviewed with a high frequency filter of 70Hz  and a low frequency filter of 1Hz . EEG data were recorded continuously and digitally stored. Description: EEG showed continuous generalized 3-7 hertz theta-delta slowing in right hemisphere and 2 to 3 Hz rhythmic delta slowing in left hemisphere.  No clear posterior dominant rhythm was seen.  Hyperventilation and photic stimulation were not performed. Abnormality -Continuous slow, generalized and lateralized right hemisphere IMPRESSION: This study is suggestive of cortical dysfunction in the right hemisphere likely secondary to underlying infarct, postictal state.  Additionally, there is evidence of mild diffuse encephalopathy, nonspecific to etiology.  No seizures or epileptiform discharges were seen throughout the recording. Lora Havens   Assessment:  44 year old with new onset seizures in the setting of previous strokes. EEG showed Continuous slow, generalized and lateralized right hemisphere,  No seizures or epileptiform discharges were seen throughout the recording. MRI did not show an acute stroke. Did show a chronic right  thalamic infarct and chronic right  PCA.  Impression: new onset seizure  Recommendations:  -- continue keppra 500 mg BID --f/u outpatient neurology Per [facility] statutes, patients with seizures are not allowed to drive until  they have been seizure-free for six months. Use caution when using heavy equipment or power tools. Avoid working on ladders or at heights. Take showers instead of baths. Ensure the water temperature is not too high on the home water heater. Do not go swimming alone. When caring for infants or small children, sit down when holding, feeding, or changing them to minimize risk of injury to the child in the event you have a seizure.   Also, Maintain good sleep hygiene. Avoid alcohol.   Laurey Morale, MSN, NP-C [facility] [phone]  Attending neurologist's note to follow  Patient with right PCA infarct presents with seizures.  Back to his baseline which includes left hemiparesis and visual field deficits.  Explained importance of continuing Keppra medication.  Patient does not drive or drink alcohol according to his wife.  We will need outpatient neurology follow-up in 2 to 4 weeks  03/18/2019, 3:00 PM

## 2019-03-18 NOTE — Discharge Summary (Addendum)
Name: Todd Hood MRN: [id number] DOB: 01-Aug-1975 44 y.o. PCP: Patient, No Pcp Per  Date of Admission: 03/17/2019  4:54 AM Date of Discharge: 03/18/2019 Attending Physician: Velna Ochs, MD  Discharge Diagnosis: 1. Seizures  Discharge Medications: Allergies as of 03/18/2019   No Known Allergies     Medication List    TAKE these medications   acetaminophen 650 MG CR tablet Commonly known as: TYLENOL Take 650 mg by mouth every 8 (eight) hours as needed for pain.   atorvastatin 80 MG tablet Commonly known as: LIPITOR Take 1 tablet (80 mg total) by mouth at bedtime.   clopidogrel 75 MG tablet Commonly known as: PLAVIX TAKE 1 TABLET BY MOUTH AT BEDTIME   empagliflozin 10 MG Tabs tablet Commonly known as: JARDIANCE Take 10 mg by mouth daily.   gabapentin 100 MG capsule Commonly known as: NEURONTIN Take 100 mg by mouth 3 (three) times daily as needed for pain.   glipiZIDE 5 MG tablet Commonly known as: GLUCOTROL Take 5 mg by mouth in the morning and at bedtime.   levETIRAcetam 500 MG tablet Commonly known as: KEPPRA Take 1 tablet (500 mg total) by mouth 2 (two) times daily.   losartan 100 MG tablet Commonly known as: COZAAR Take 100 mg by mouth daily.   metFORMIN 500 MG tablet Commonly known as: GLUCOPHAGE Take 500 mg by mouth in the morning and at bedtime.   metoprolol tartrate 25 MG tablet Commonly known as: LOPRESSOR Take 1 tablet (25 mg total) by mouth 2 (two) times daily.       Disposition and follow-up:   Todd Hood was discharged from [facility] in Good condition.  At the hospital follow up visit please address:  1.  Follow-up: 1) patient will need follow-up with outpatient neurology.  2) he will need further evaluation and management of his diabetes.  2.  Labs / imaging needed at time of follow-up: Hemoglobin A1c/BMP  3.  Pending labs/ test needing follow-up: Hemoglobin A1c  Follow-up Appointments: Follow-up  Information    Verlon Setting, FNP Follow up.   Specialty: Nurse Practitioner Contact information: [address] [place] [address] [phone]        Marijo File, MD Follow up.   Specialty: Family Medicine Contact information: [address] [place] [address] [phone]           Hospital Course by problem list: 1.  Seizure: Patient was admitted to [facility] with 2 episodes of seizure-like activity was aborted with 2.5 mg of Versed.  Patient was loaded on IV Keppra and EEG showed no further epileptiform activity.  Patient was started on stable dose of AEDs by neurology.  Considering the patient has history of CVA, work-up for subsequent stroke showed subacute infarct of the right pons with prior lacunar infarcts.  Patient continued on atorvastatin 80 mg and clopidogrel 75 mg with outpatient follow-up with neurology.   Discharge Vitals:   BP (!) 156/119 (BP Location: Right Arm)   Pulse 79   Temp 98.8 F (37.1 C) (Oral)   Resp 18   SpO2 99%   Pertinent Labs, Studies, and Procedures:  CBC Latest Ref Rng & Units 03/18/2019 03/17/2019 10/20/2018  WBC 4.0 - 10.5 K/uL 8.7 - 6.6  Hemoglobin 13.0 - 17.0 g/dL 15.4 12.9(L) 16.5  Hematocrit 39.0 - 52.0 % 43.2 38.0(L) 44.1  Platelets 150 - 400 K/uL 224 - 231   CMP Latest Ref Rng & Units 03/18/2019  03/17/2019 03/17/2019  Glucose 70 - 99 mg/dL 423(N) 361(W) 431(V)  BUN 6 - 20 mg/dL 8 11 16   Creatinine 0.61 - 1.24 mg/dL 4.00 8.67)  Sodium 135 - 145 mmol/L 141 145 146(H)  Potassium 3.5 - 5.1 mmol/L 2.8(L) 3.3(L) 3.0(L)  Chloride 98 - 111 mmol/L 104 112(H) 114(H)  CO2 22 - 32 mmol/L 24 22 -  Calcium 8.9 - 10.3 mg/dL 9.1 9.0 -  Total Protein 6.5 - 8.1 g/dL 6.8 6.3(L) -  Total Bilirubin 0.3 - 1.2 mg/dL 6.19(J) 0.8 -  Alkaline Phos 38 - 126 U/L 96 89 -  AST 15 - 41 U/L 19 19 -  ALT 0 - 44 U/L 20 17 -   CT Head: 1. Subacute infarct involving the right pons.  2. Prior lacunar  infarcts as described above.  3. There is dilatation the ventricles and sulci consistent with age-related atrophy. Low-attenuation changes in the deep white matter consistent with small vessel ischemia.  4.  No acute fracture or malalignment of the spine.  MR Brain: 1. Subacute infarct involving the right pons. 2. Prior lacunar infarcts as described above.  3. There is dilatation the ventricles and sulci consistent with age-related atrophy. Low-attenuation changes in the deep white matter consistent with small vessel ischemia.  4.  No acute fracture or malalignment of the spine.  EEG: This study is suggestive of cortical dysfunction in the right hemisphere likely secondary to underlying infarct, postictal state.  Additionally, there is evidence of mild diffuse encephalopathy, nonspecific to etiology.  No seizures or epileptiform discharges were seen throughout the recording   Discharge Instructions: Discharge Instructions    Diet - low sodium heart healthy   Complete by: As directed    Discharge instructions   Complete by: As directed    You were hospitalized for Seizures. Thank you for allowing 0.9(T to be part of your care.   We arranged for you to follow up at:   1. Neurology for seizures 2. Primary Care Physician for blood pressure management   Please note these changes made to your medications:   Please START taking:  1. Keppra 500 mg twice daily   Please STOP taking:  1. none  Please make sure to follow up with neurology and take your anti-seizure medication (Keppra)  Please call our clinic if you have any questions or concerns, we may be able to help and keep you from a long and expensive emergency room wait. Our clinic and after hours phone number is [phone], the best time to call is Monday through Friday 9 am to 4 pm but there is always someone available 24/7 if you have an emergency. If you need medication refills please notify your pharmacy one week in advance and  they will send Wednesday a request.   Increase activity slowly   Complete by: As directed       Signed: [place], MD 03/21/2019, 7:50 AM    Pager: [phone]

## 2019-03-18 NOTE — Plan of Care (Signed)

## 2019-06-13 ENCOUNTER — Other Ambulatory Visit: Payer: Self-pay | Admitting: Internal Medicine

## 2021-01-03 IMAGING — CT CT HEAD W/O CM
4 series · 15 of 47 positions shown, 17 images · non-contrast
Comparison: August 16, 2018

CLINICAL DATA: New onset seizures

EXAM:
CT HEAD WITHOUT CONTRAST
TECHNIQUE: Contiguous axial images were obtained from the base of the skull
through the vertex without intravenous contrast.

[Series 3: head wo · axial · 0.48mm/px · z∈[-94,+32]mm · 7 of 35 slices shown, 9 images]
[im 5/35  brain]
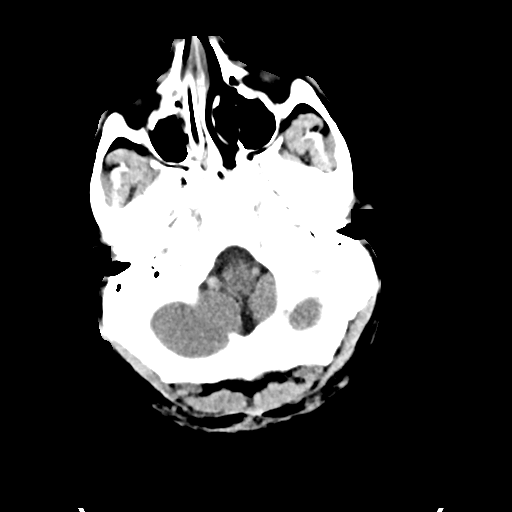
[im 5/35  bone]
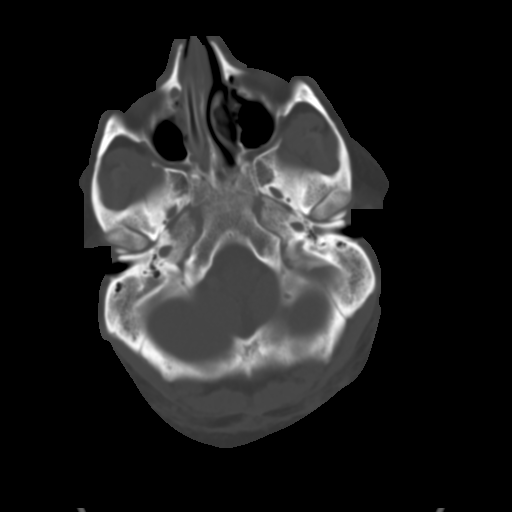
[im 9/35  brain]
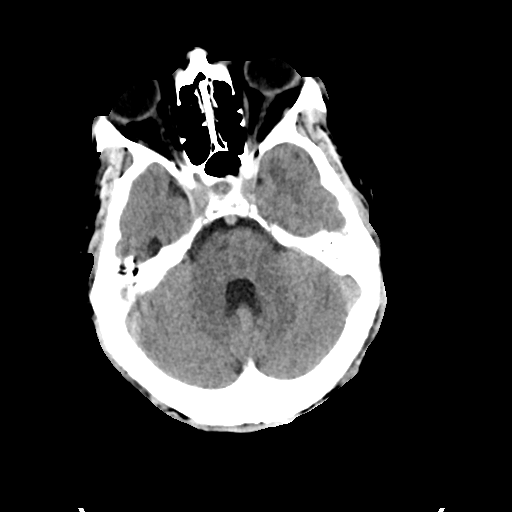
[im 13/35  brain]
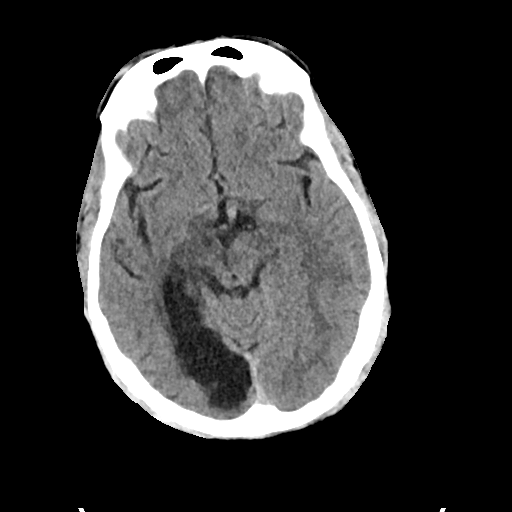
[im 18/35  brain]
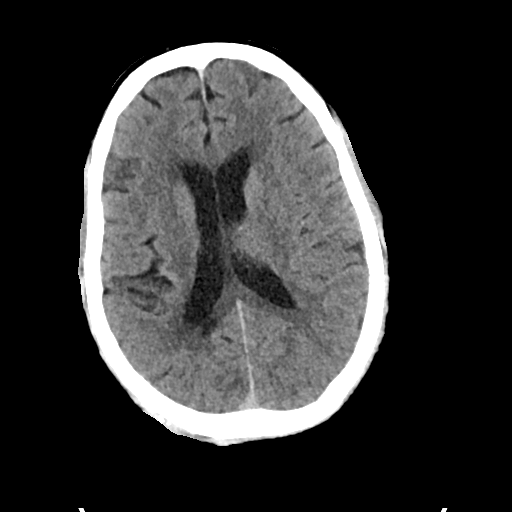
[im 22/35  brain]
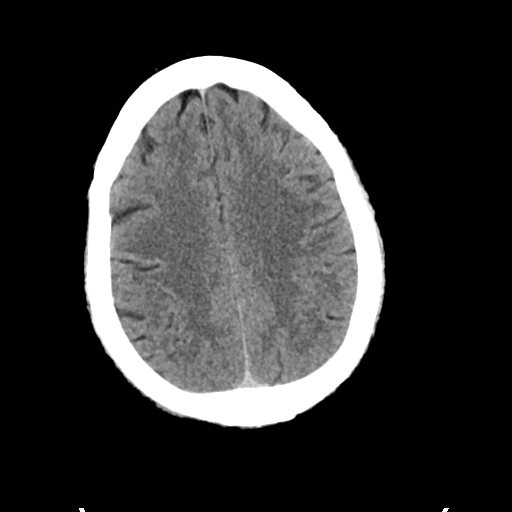
[im 22/35  bone]
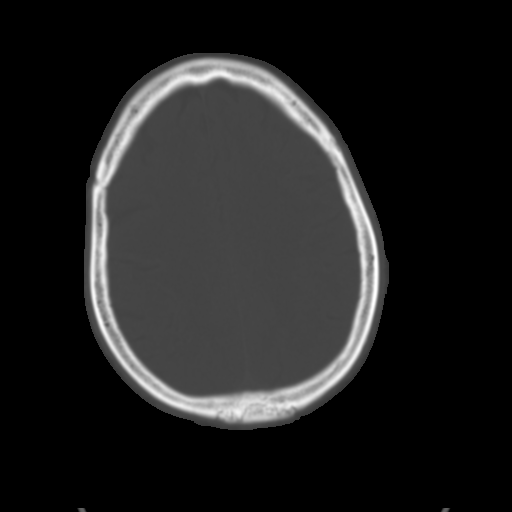
[im 26/35  brain]
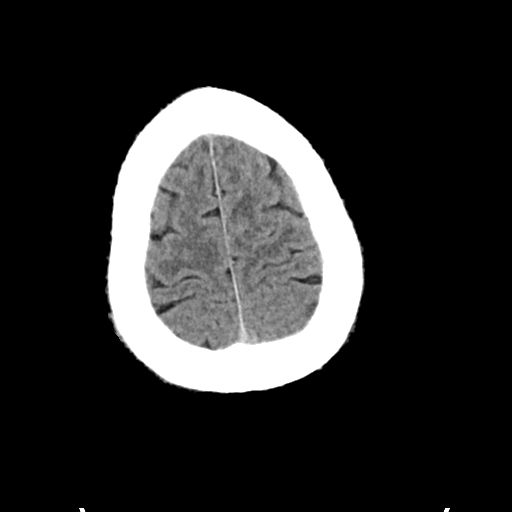
[im 30/35  brain]
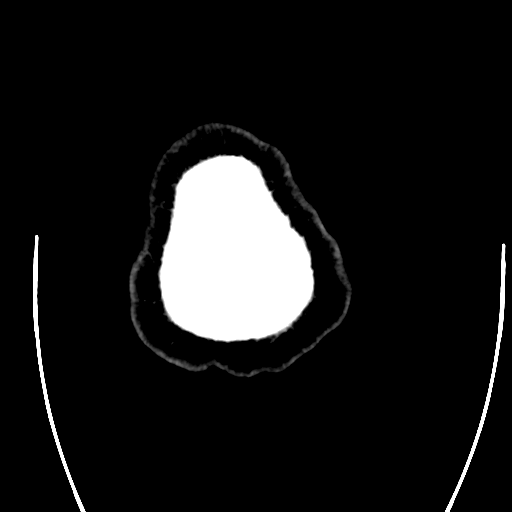

[Series 4: head bone · axial · 0.48mm/px · z∈[-98,-80]mm · 2 of 86 slices shown]
[im 9/86  bone]
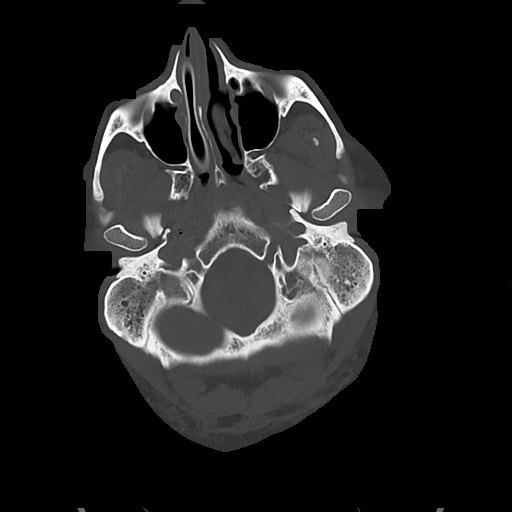
[im 18/86  bone]
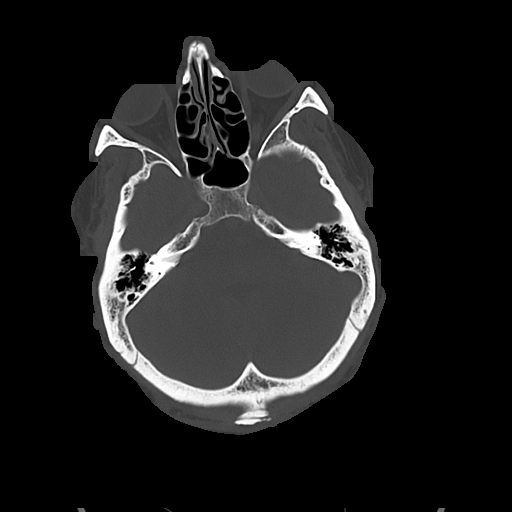

[Series 5: cor soft · coronal · 0.35mm/px · 3 of 83 slices shown]
[im 28/83  brain]
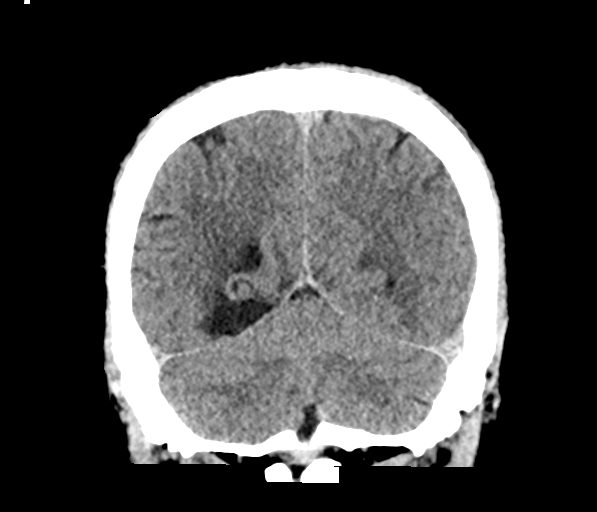
[im 37/83  brain]
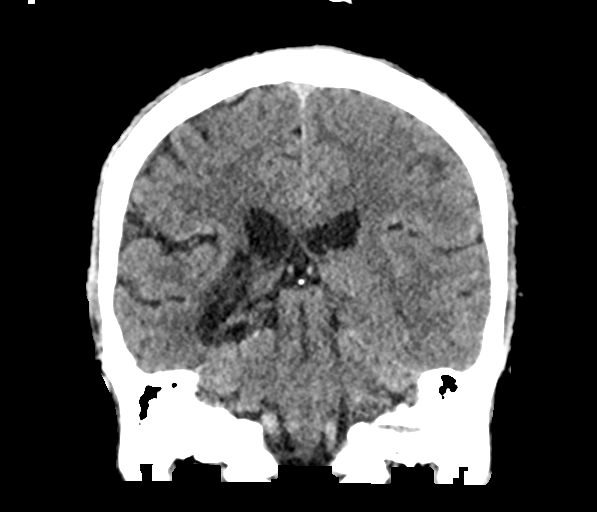
[im 46/83  brain]
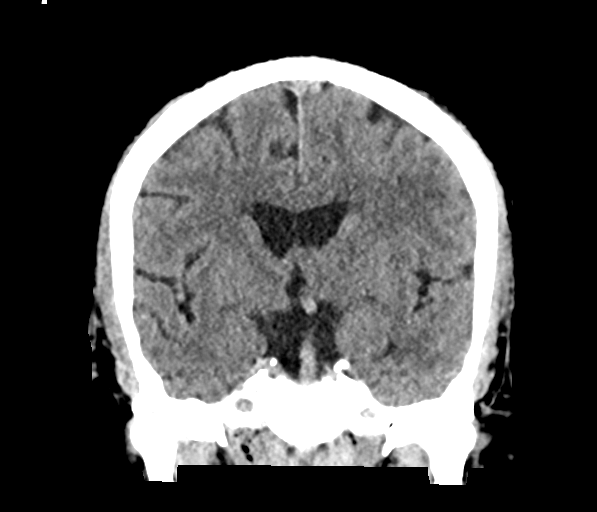

[Series 6: sag soft · sagittal · 0.38mm/px · 3 of 62 slices shown]
[im 21/62  brain]
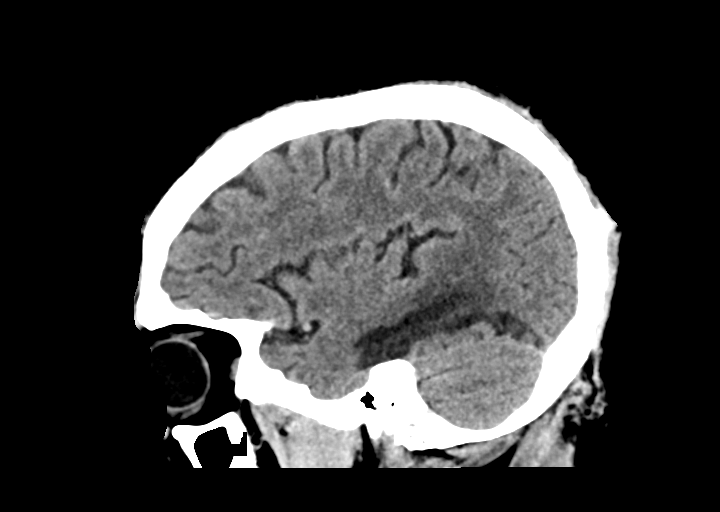
[im 31/62  brain]
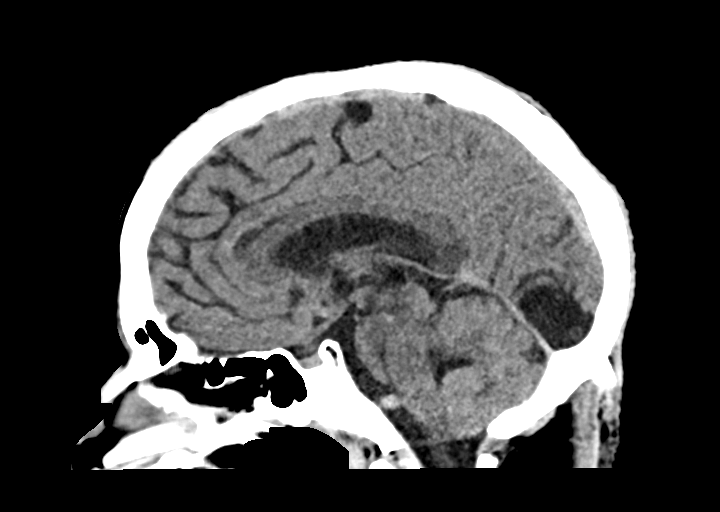
[im 41/62  brain]
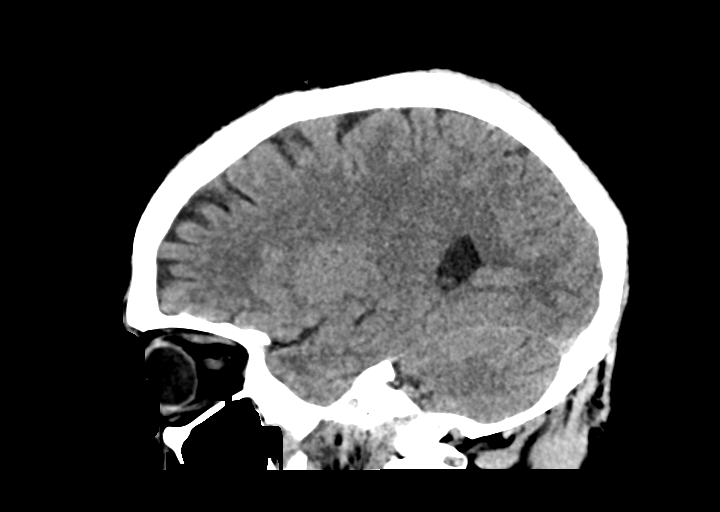

[15 of 47 positions shown; findings below may reference images not displayed]

FINDINGS: Brain: Again noted is large area of encephalomalacia involving the
right occipital lobe, right temporal lobe. Prior infarcts are seen
within the right thalamus right basal ganglia and right mid brain.
New since the prior exam is a focal area of hypodensity within the
right pons. There is dilatation the ventricles and sulci consistent
with age-related atrophy. Low-attenuation changes in the deep white
matter consistent with small vessel ischemia.

Vascular: No hyperdense vessel or unexpected calcification.

Skull: The skull is intact. No fracture or focal lesion identified.

Sinuses/Orbits: The visualized paranasal sinuses and mastoid air
cells are clear. The orbits and globes intact.

Other: None

Cervical spine:

Alignment: Slight reversal of the normal cervical lordosis.

Skull base and vertebrae: Visualized skull base is intact. No
atlanto-occipital dissociation. The vertebral body heights are well
maintained. No fracture or pathologic osseous lesion seen.

Soft tissues and spinal canal: The visualized paraspinal soft
tissues are unremarkable. No prevertebral soft tissue swelling is
seen. The spinal canal is grossly unremarkable, no large epidural
collection or significant canal narrowing.

Disc levels:    No significant canal or neural foraminal narrowing.

Upper chest: The lung apices are clear. Thoracic inlet is within
normal limits.

Other: None
IMPRESSION: 1. Subacute infarct involving the right pons.
2. Prior lacunar infarcts as described above.
3. There is dilatation the ventricles and sulci consistent with
age-related atrophy. Low-attenuation changes in the deep white
matter consistent with small vessel ischemia.
4.  No acute fracture or malalignment of the spine.

## 2021-01-03 IMAGING — CT CT CERVICAL SPINE W/O CM
4 series · 15 of 35 positions shown, 17 images · non-contrast
Comparison: August 16, 2018

CLINICAL DATA: New onset seizures

EXAM:
CT HEAD WITHOUT CONTRAST
TECHNIQUE: Contiguous axial images were obtained from the base of the skull
through the vertex without intravenous contrast.

[Series 5: c spine soft · axial · 0.39mm/px · z∈[-243,-179]mm · 3 of 112 slices shown]
[im 16/112  soft-tissue]
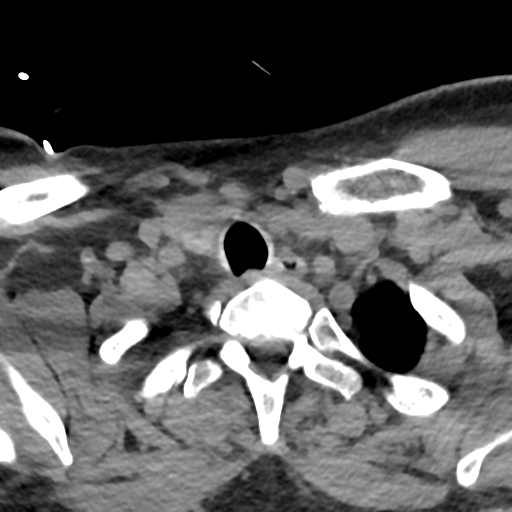
[im 32/112  soft-tissue]
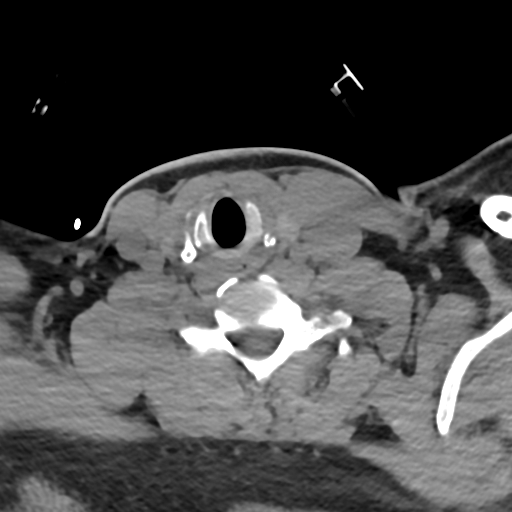
[im 48/112  soft-tissue]
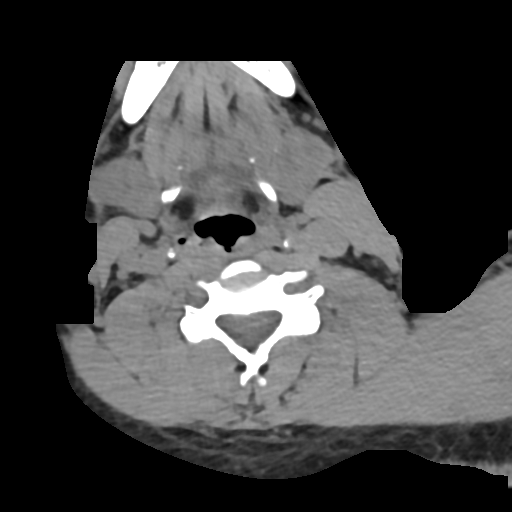

[Series 8: sag bone · sagittal · 0.45mm/px · 5 of 244 slices shown, 6 images]
[im 82/244  bone]
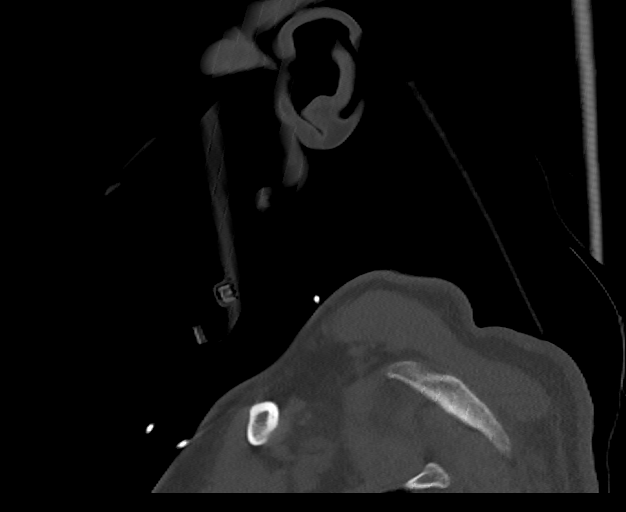
[im 102/244  bone]
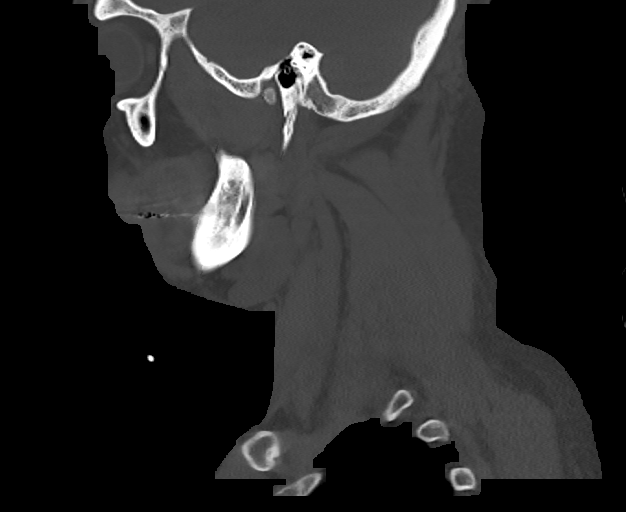
[im 122/244  soft-tissue]
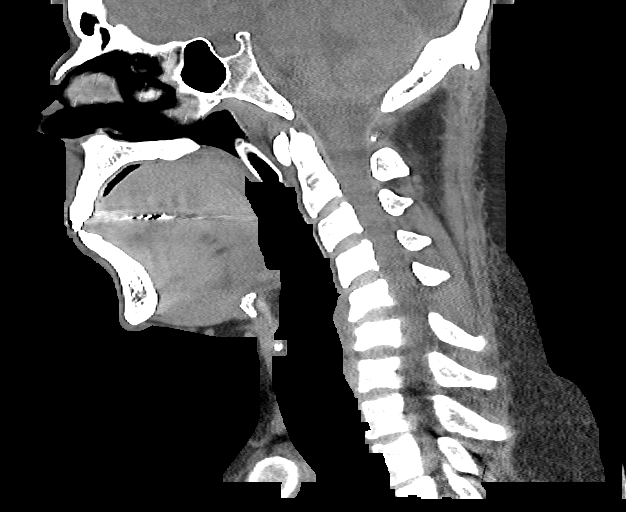
[im 122/244  bone]
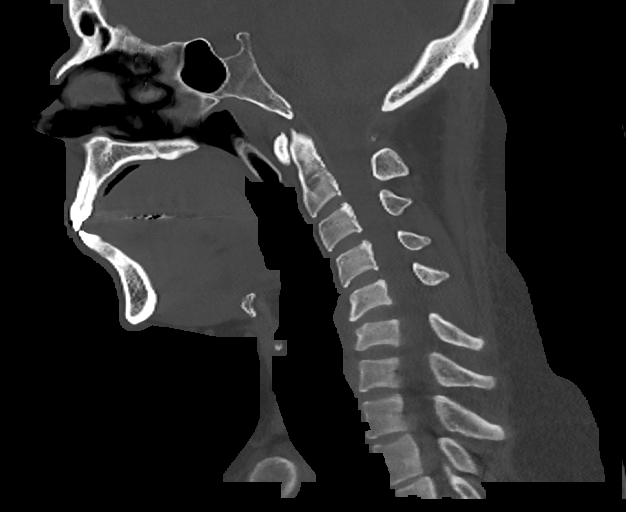
[im 142/244  bone]
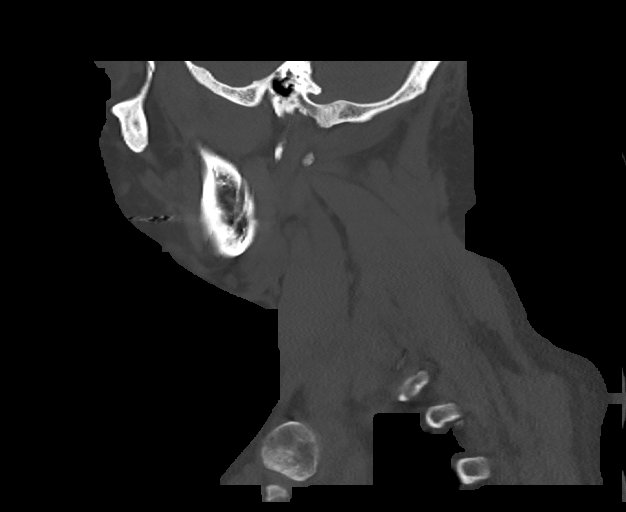
[im 163/244  bone]
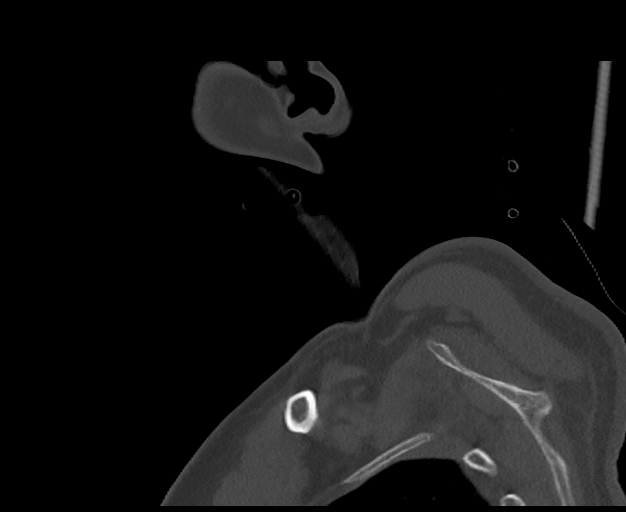

[Series 9: cor bone · coronal · 0.45mm/px · 3 of 141 slices shown]
[im 29/141  bone]
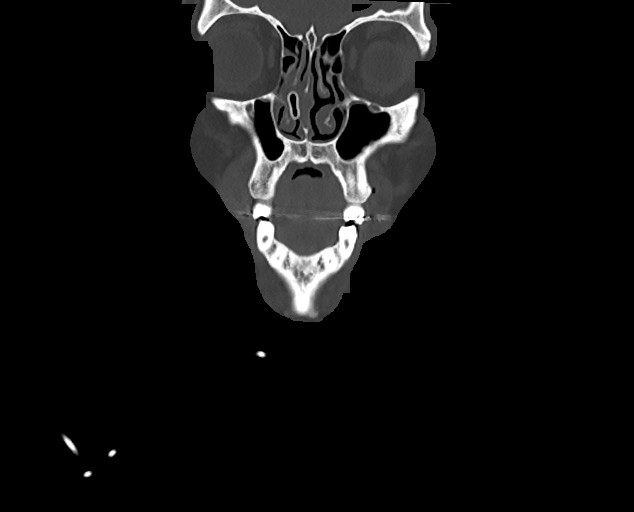
[im 57/141  bone]
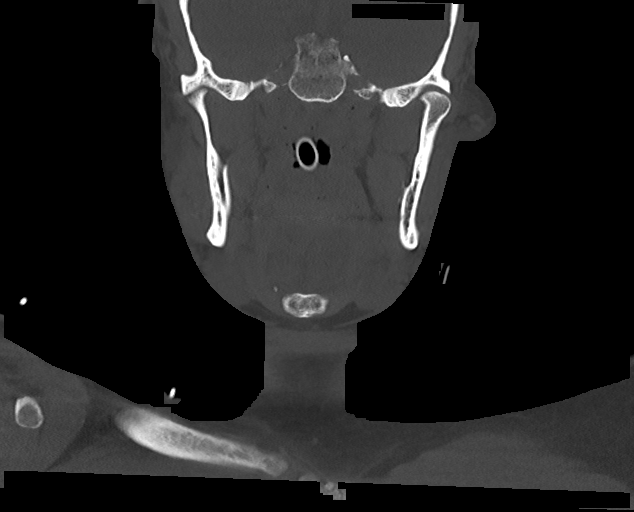
[im 85/141  bone]
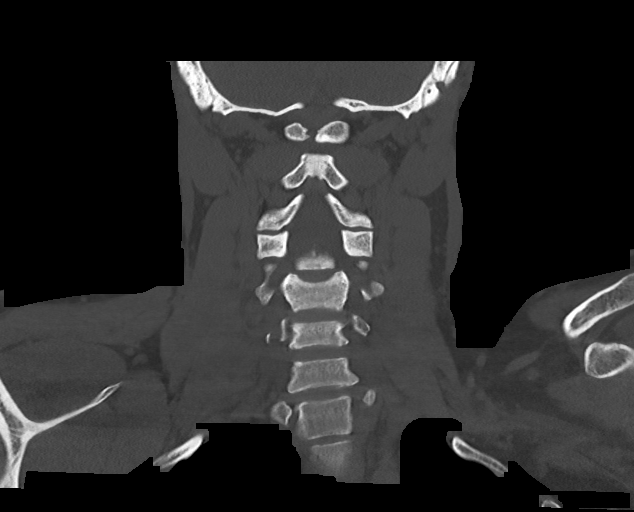

[Series 10: orthogonal axials · axial · 0.21mm/px · z∈[-245,-151]mm · 4 of 94 slices shown, 5 images]
[im 19/94  soft-tissue]
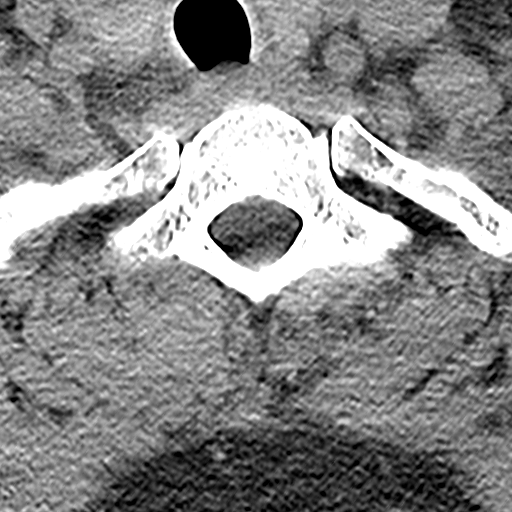
[im 19/94  bone]
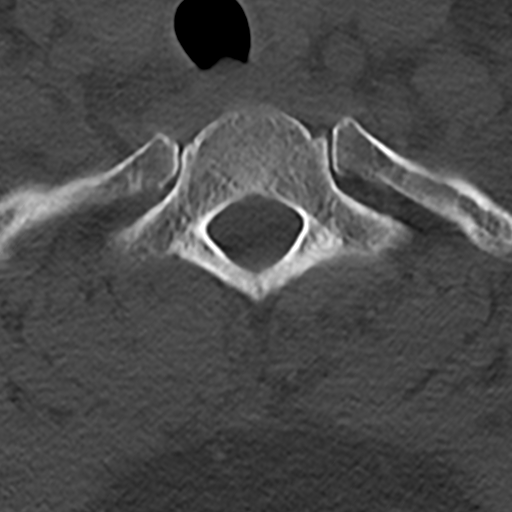
[im 38/94  bone]
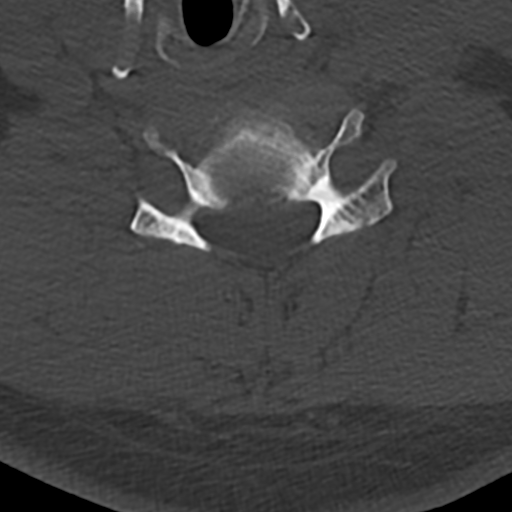
[im 56/94  bone]
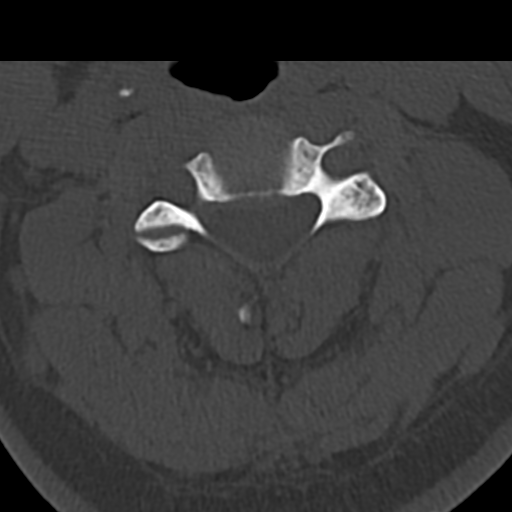
[im 75/94  bone]
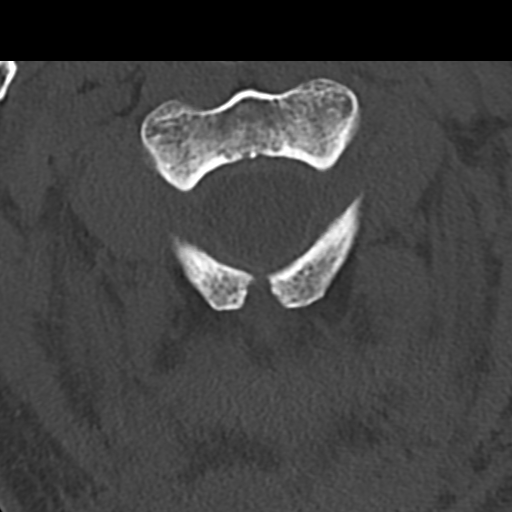

[15 of 35 positions shown; findings below may reference images not displayed]

FINDINGS: Brain: Again noted is large area of encephalomalacia involving the
right occipital lobe, right temporal lobe. Prior infarcts are seen
within the right thalamus right basal ganglia and right mid brain.
New since the prior exam is a focal area of hypodensity within the
right pons. There is dilatation the ventricles and sulci consistent
with age-related atrophy. Low-attenuation changes in the deep white
matter consistent with small vessel ischemia.

Vascular: No hyperdense vessel or unexpected calcification.

Skull: The skull is intact. No fracture or focal lesion identified.

Sinuses/Orbits: The visualized paranasal sinuses and mastoid air
cells are clear. The orbits and globes intact.

Other: None

Cervical spine:

Alignment: Slight reversal of the normal cervical lordosis.

Skull base and vertebrae: Visualized skull base is intact. No
atlanto-occipital dissociation. The vertebral body heights are well
maintained. No fracture or pathologic osseous lesion seen.

Soft tissues and spinal canal: The visualized paraspinal soft
tissues are unremarkable. No prevertebral soft tissue swelling is
seen. The spinal canal is grossly unremarkable, no large epidural
collection or significant canal narrowing.

Disc levels:    No significant canal or neural foraminal narrowing.

Upper chest: The lung apices are clear. Thoracic inlet is within
normal limits.

Other: None
IMPRESSION: 1. Subacute infarct involving the right pons.
2. Prior lacunar infarcts as described above.
3. There is dilatation the ventricles and sulci consistent with
age-related atrophy. Low-attenuation changes in the deep white
matter consistent with small vessel ischemia.
4.  No acute fracture or malalignment of the spine.
